# Patient Record
Sex: Male | Born: 1970 | Race: Black or African American | Hispanic: No | Marital: Married | Smoking: Never smoker
Health system: Southern US, Community
[De-identification: ages and names within clinical notes are randomized; demographics above are authoritative.]

## PROBLEM LIST (undated history)

## (undated) DIAGNOSIS — K219 Gastro-esophageal reflux disease without esophagitis: Secondary | ICD-10-CM

## (undated) DIAGNOSIS — E669 Obesity, unspecified: Secondary | ICD-10-CM

## (undated) DIAGNOSIS — G4730 Sleep apnea, unspecified: Secondary | ICD-10-CM

## (undated) DIAGNOSIS — I10 Essential (primary) hypertension: Secondary | ICD-10-CM

## (undated) DIAGNOSIS — E119 Type 2 diabetes mellitus without complications: Secondary | ICD-10-CM

## (undated) DIAGNOSIS — E785 Hyperlipidemia, unspecified: Secondary | ICD-10-CM

## (undated) HISTORY — DX: Sleep apnea, unspecified: G47.30

## (undated) HISTORY — DX: Hyperlipidemia, unspecified: E78.5

## (undated) HISTORY — DX: Gastro-esophageal reflux disease without esophagitis: K21.9

## (undated) HISTORY — PX: CARPAL TUNNEL RELEASE: SHX101

## (undated) HISTORY — DX: Obesity, unspecified: E66.9

## (undated) HISTORY — PX: HAND SURGERY: SHX662

---
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
## 2001-08-19 ENCOUNTER — Encounter: Payer: Self-pay | Admitting: Orthopedic Surgery

## 2001-08-19 ENCOUNTER — Ambulatory Visit (HOSPITAL_COMMUNITY): Admission: RE | Admit: 2001-08-19 | Discharge: 2001-08-19 | Payer: Self-pay | Admitting: Orthopedic Surgery

## 2002-03-14 ENCOUNTER — Emergency Department (HOSPITAL_COMMUNITY): Admission: EM | Admit: 2002-03-14 | Discharge: 2002-03-14 | Payer: Self-pay | Admitting: Emergency Medicine

## 2004-09-19 ENCOUNTER — Emergency Department (HOSPITAL_COMMUNITY): Admission: EM | Admit: 2004-09-19 | Discharge: 2004-09-19 | Payer: Self-pay | Admitting: Family Medicine

## 2004-11-13 ENCOUNTER — Emergency Department (HOSPITAL_COMMUNITY): Admission: EM | Admit: 2004-11-13 | Discharge: 2004-11-13 | Payer: Self-pay | Admitting: Family Medicine

## 2005-05-16 ENCOUNTER — Encounter: Admission: RE | Admit: 2005-05-16 | Discharge: 2005-05-16 | Payer: Self-pay | Admitting: Gastroenterology

## 2005-06-17 ENCOUNTER — Emergency Department (HOSPITAL_COMMUNITY): Admission: EM | Admit: 2005-06-17 | Discharge: 2005-06-17 | Payer: Self-pay | Admitting: Family Medicine

## 2005-07-15 ENCOUNTER — Ambulatory Visit (HOSPITAL_BASED_OUTPATIENT_CLINIC_OR_DEPARTMENT_OTHER): Admission: RE | Admit: 2005-07-15 | Discharge: 2005-07-15 | Payer: Self-pay | Admitting: Family Medicine

## 2005-07-28 ENCOUNTER — Ambulatory Visit: Payer: Self-pay | Admitting: Internal Medicine

## 2005-10-24 ENCOUNTER — Emergency Department (HOSPITAL_COMMUNITY): Admission: EM | Admit: 2005-10-24 | Discharge: 2005-10-24 | Payer: Self-pay | Admitting: Family Medicine

## 2006-04-29 DIAGNOSIS — I10 Essential (primary) hypertension: Secondary | ICD-10-CM

## 2006-04-29 HISTORY — DX: Essential (primary) hypertension: I10

## 2006-05-01 ENCOUNTER — Encounter (INDEPENDENT_AMBULATORY_CARE_PROVIDER_SITE_OTHER): Payer: Self-pay | Admitting: Interventional Cardiology

## 2006-05-01 ENCOUNTER — Inpatient Hospital Stay (HOSPITAL_COMMUNITY): Admission: EM | Admit: 2006-05-01 | Discharge: 2006-05-02 | Payer: Self-pay | Admitting: Emergency Medicine

## 2006-06-12 ENCOUNTER — Ambulatory Visit (HOSPITAL_COMMUNITY): Admission: RE | Admit: 2006-06-12 | Discharge: 2006-06-12 | Payer: Self-pay | Admitting: Gastroenterology

## 2006-09-01 ENCOUNTER — Ambulatory Visit: Payer: Self-pay | Admitting: Family Medicine

## 2007-04-30 DIAGNOSIS — Z9289 Personal history of other medical treatment: Secondary | ICD-10-CM

## 2007-04-30 HISTORY — DX: Personal history of other medical treatment: Z92.89

## 2007-11-13 ENCOUNTER — Ambulatory Visit (HOSPITAL_COMMUNITY): Admission: RE | Admit: 2007-11-13 | Discharge: 2007-11-13 | Payer: Self-pay | Admitting: Urology

## 2008-04-29 HISTORY — PX: COLONOSCOPY: SHX174

## 2010-04-13 ENCOUNTER — Emergency Department (HOSPITAL_COMMUNITY)
Admission: EM | Admit: 2010-04-13 | Discharge: 2010-04-13 | Payer: Self-pay | Source: Home / Self Care | Admitting: Family Medicine

## 2010-05-19 ENCOUNTER — Encounter: Payer: Self-pay | Admitting: Gastroenterology

## 2010-07-09 LAB — POCT I-STAT, CHEM 8
HCT: 44 % (ref 39.0–52.0)
Hemoglobin: 15 g/dL (ref 13.0–17.0)
Potassium: 4.1 mEq/L (ref 3.5–5.1)
Sodium: 137 mEq/L (ref 135–145)
TCO2: 30 mmol/L (ref 0–100)

## 2010-08-29 ENCOUNTER — Other Ambulatory Visit: Payer: Self-pay | Admitting: Family Medicine

## 2010-08-29 DIAGNOSIS — M545 Low back pain, unspecified: Secondary | ICD-10-CM

## 2010-09-05 ENCOUNTER — Ambulatory Visit
Admission: RE | Admit: 2010-09-05 | Discharge: 2010-09-05 | Disposition: A | Discharge status: Home / Self Care | Payer: No Typology Code available for payment source | Source: Ambulatory Visit | Attending: Family Medicine | Admitting: Family Medicine

## 2010-09-05 DIAGNOSIS — M545 Low back pain, unspecified: Secondary | ICD-10-CM

## 2010-09-05 MED ORDER — GADOBENATE DIMEGLUMINE 529 MG/ML IV SOLN
20.0000 mL | Freq: Once | INTRAVENOUS | Status: AC | PRN
  Administered 2010-09-05: 20 mL via INTRAVENOUS

## 2010-09-11 NOTE — Op Note (Signed)
Christopher Skinner, Christopher Skinner          ACCOUNT NO.:  [id number]   MEDICAL RECORD NO.:  [id number]          PATIENT TYPE:  AMB   LOCATION:  DAY                          FACILITY:  [facility]   PHYSICIAN:  Bertram Millard. Dahlstedt, M.D.DATE OF BIRTH:  05/31/1970   DATE OF PROCEDURE:  11/13/2007  DATE OF DISCHARGE:                               OPERATIVE REPORT   PREOPERATIVE DIAGNOSIS:  Phimosis.   POSTOPERATIVE DIAGNOSIS:  Phimosis.   PRINCIPAL PROCEDURE:  Circumcision.   SURGEON:  Bertram Millard. Dahlstedt, M.D.   ANESTHESIA:  General with LMA.   COMPLICATIONS:  None.   BRIEF HISTORY:  Adult male with painful phimosis, with tearing of his  foreskin during intercourse, precluding normal activities for the  patient.  The patient presented recently with these symptoms.  An  evaluation revealed mild phimosis and balanitis.  He presents at this  time for circumcision as primary therapy.  Risks and complications of  the procedure have been discussed with the patient.  This will be done  on an outpatient basis.   DESCRIPTION OF PROCEDURE:  The patient was identified in the holding  area and received preoperative IV antibiotics.  He was taken to the  operating room where general anesthetic was administered.  Genitalia and  perineum were prepped and draped.  Two circumcising incisions were made,  one on the proximal and one on the distal foreskin.  The foreskin was  excised with electrocautery.  Careful hemostasis was obtained on the  underlying skin.  Quadrant sutures of 4-0 chromic were then placed, with  the ventral most being a U stitch in the frenulum.  In between all  these quadrant sutures the same 4-0 chromic was used to run the skin  closed in a simple running fashion.  The wound was hemostatic at this  point.  The usual dressing of Vaseline gauze, Kling and Coban were  placed.   The patient received a block with quarter percent plain Marcaine prior  to the procedure.  He was then taken to  the PACU in stable condition.  He will follow up in approximately 2 weeks.      Bertram Millard. Dahlstedt, M.D.  Electronically Signed     SMD/MEDQ  D:  11/13/2007  T:  11/13/2007  Job:  [id number]

## 2010-09-14 NOTE — Cardiovascular Report (Signed)
NAMEDARROLL, BREDESON NO.:  [id number]   MEDICAL RECORD NO.:  [id number]          PATIENT TYPE:  INP   LOCATION:  2009                         FACILITY:  [facility]   PHYSICIAN:  Lyn Records, M.D.   DATE OF BIRTH:  17-May-1970   DATE OF PROCEDURE:  DATE OF DISCHARGE:                            CARDIAC CATHETERIZATION   INDICATION:  The patient is 40 years of age, obese, has sleep apnea and  hypertension, not currently treated.  For 1-2 weeks he has been having  exertional dyspnea and intermittent sharp and pressure-like chest  discomfort.  This morning after showering he developed severe substernal  discomfort that was really more of a sharp, tight discomfort with an  inspiratory component that tended to exacerbate the discomfort.  The  patient came to the emergency room after this discomfort had persisted  for 30-40 minutes and 3 sets of cardiac markers (POC) revealed NB  components that were elevated with normal troponins.  Because of his  risk factors and the indeterminate nature of his clinical presentation,  it is felt that coronary angiography as a direct means of coronary  visualization is the most appropriate test.  His obesity would make ECHO  imaging and nuclear imaging difficult.  Since he is still hurting, time  is of the essence.  Pulmonary embolism is felt to be less likely because  his D-dimer is normal.   PROCEDURE PERFORMED:  1. Left heart catheter.  2. Selective coronary angiography.  3. Left ventriculography.   DESCRIPTION:  After informed consent, a 6-French sheath was placed in  the right femoral artery used with a single anterior stick.  The sheath  was then inserted without difficulty and an A2 multipurpose catheter was  advanced retrograde over a wire to the ascending aorta.  Hemodynamic  recordings were performed and left ventriculography by hand injection  was performed.  This catheter was used for left coronary angiography as  well.   It was also used for right coronary angiography.  The patient  tolerated the procedure without complications.  We did not attempt Angio-  Seal.   RESULTS:  1. Hemodynamic data.      a.     Aortic pressure 137/98.      b.     Left ventricular pressure 133/16.  2. Left ventriculography:  Left ventricular cavity size and function      are normal.  EF is normal.  3. Coronary angiography.      a.     Left main coronary:  The left main coronary artery is       normal.      b.     Left anterior descending coronary artery:  The left anterior       descending coronary artery is a large vessel that wraps around the       left ventricular apex and contains no significant stenoses.       Irregularities are noted in the midvessel.  A large first diagonal       arises from the mid-LAD and is normal.  A large second diagonal  also arises from the LAD and is normal.      c.     Circumflex artery:  The circumflex artery gives origin to 2       small obtuse marginals distally.  It is also free of obstruction.      d.     Right coronary:  The right coronary artery is normal.  PDA       and left ventricular branches are normal.   CONCLUSION:  1. Normal coronary arteries.  2. Normal left ventricular function.  3. Differential diagnosis now consists of probable acute pericarditis      versus noncardiac chest pain versus pulmonary embolism.  Embolism      seems doubtful given the absence of shortness of breath, normal O2      saturation, and normal D-dimer.      Lyn Records, M.D.  Electronically Signed     HWS/MEDQ  D:  05/01/2006  T:  05/01/2006  Job:  [id number]   cc:   Molly Maduro A. Nicholos Johns, M.D.

## 2010-09-14 NOTE — Procedures (Signed)
Christopher Skinner, Christopher Skinner          ACCOUNT NO.:  [id number]   MEDICAL RECORD NO.:  [id number]          PATIENT TYPE:  OUT   LOCATION:  SLEEP CENTER                 FACILITY:  [facility]   PHYSICIAN:  Clinton D. Maple Hudson, M.D. DATE OF BIRTH:  1971-04-21   DATE OF STUDY:  07/15/2005                              NOCTURNAL POLYSOMNOGRAM   REFERRING PHYSICIAN:  Dr. Elias Else.   INDICATIONS FOR STUDY:  Hypersomnia with sleep apnea.   EPWORTH SLEEPINESS SCORE:  16/24, BMI 51.3, weight 347 pounds.   HOME MEDICATIONS:  Prednisone, AcipHex.   SLEEP ARCHITECTURE:  Total sleep time 410 minutes with sleep efficiency 93%.  Stage I was 12%, stage II 72%. Stages III and IV absent. REM 17% of total  sleep time.  Sleep latency 7 minutes. REM latency 101 minutes. Awake after  sleep onset 27 minutes. Arousal index 17.6.  No bedtime medication was  reported.   RESPIRATORY DATA:  Split study protocol.  Apnea/hypopnea index (AHI, RDI)  18.1 obstructive events per hour indicating moderate obstructive sleep  apnea/hypopnea syndrome before CPAP.  This included 3 obstructive apneas and  36 hypopneas before CPAP.  Events were not positional.  REM AHI 5.3 per  hour.  CPAP was titrated to 12 CWP, AHI 0 per hour.  A small Respironics  ComfortGel nasal mask was used with a heated humidifier and reported as well  tolerated.   OXYGEN DATA:  Loud snoring with oxygen desaturation to a nadir of 89% before  CPAP control.  After CPAP, saturation held 96% on room air.   CARDIAC DATA:  Normal sinus rhythm.   MOVEMENT/PARASOMNIA:  No significant leg jerk or behavior disturbance.  Bathroom x1.   IMPRESSION/RECOMMENDATIONS:  1.  Moderate obstructive sleep apnea/hypopnea syndrome, AHI 18.1 per hour      with nonpositional events, loud snoring and oxygen desaturation to 89%.  2.  Successful CPAP titration to 12 CWP, AHI 0 per hour.  A small      Respironics ComfortGel nasal mask was used with heated humidifier.      Clinton D. Maple Hudson, M.D.  Diplomate, Biomedical engineer of Sleep Medicine  Electronically Signed     CDY/MEDQ  D:  07/28/2005 09:24:55  T:  07/29/2005 11:26:49  Job:  [id number]

## 2010-09-14 NOTE — Discharge Summary (Signed)
NAMENORM, WRAY NO.:  [id number]   MEDICAL RECORD NO.:  [id number]          PATIENT TYPE:  INP   LOCATION:  2009                         FACILITY:  [facility]   PHYSICIAN:  Lyn Records, M.D.   DATE OF BIRTH:  11/04/1970   DATE OF ADMISSION:  05/01/2006  DATE OF DISCHARGE:  05/02/2006                               DISCHARGE SUMMARY   DISCHARGE DIAGNOSES:  1. Acute pericarditis with resolution of chest pain with Decadron.  2. Hypertension.  3. Obesity.  4. Sleep apnea, using CPAP occasionally.   HOSPITAL COURSE:  Mr. Grine is a 40 year old male patient with no  known history of coronary artery disease who presented to [facility] Emergency  Room after a 2-3 day history of dyspnea.  On the date of admission, he  developed substernal chest pain once stepping out of the shower.   He presented to the emergency room.  An EKG showed normal sinus rhythm,  first degree AV block with ST segment elevation/J-point elevation  essentially diffusely, but more prominent in the interior leads.  He had  T-wave inversion inferiorly.  The patient was examined by Dr. Katrinka Blazing who  felt that the patient would benefit from an urgent cardiac  catheterization.  The cardiac catheterization showed normal coronary  arteries.  LVEF normal.   A 2D echo was then performed that showed LV systolic function at 65%.  There was no evidence of wall motion abnormalities.  No pericardial  effusion.   LABORATORY DATA:  Today show a sed rate of 19, white count of 6.9,  hemoglobin 13.2, hematocrit 39.7, platelets 373.  Sodium 137, potassium  4.2, BUN 9, creatinine 0.93.  Maximum CK 1504 with an MB fraction 12.3.  Total cholesterol 238, triglycerides 76, and HDL of 47, LDL of 176.   The patient was discharged to home in improved condition.  He was  discharged to home with the following instructions:  Increase activity  slowly.  No lifting over 10 pounds for one week.  No driving for two  days.   Clean  cath site gently with soap and water.  Call for questions or concerns.  He has been using Aciphex daily at home and he may resume this  medication.  Is to start lisinopril/HCTZ 20/12.5 mg one tablet daily.  He is to utilize ibuprofen 200 mg two tablets with each meal three times  a day consistently for one week.      Guy Franco, P.A.      Lyn Records, M.D.  Electronically Signed    LB/MEDQ  D:  06/09/2006  T:  06/10/2006  Job:  [id number]   cc:   Dr. Milly Jakob, M.D.

## 2011-01-25 LAB — BASIC METABOLIC PANEL
Calcium: 9.6
GFR calc Af Amer: >60
GFR calc non Af Amer: >60
Potassium: 4
Sodium: 139

## 2011-01-25 LAB — HEMOGLOBIN AND HEMATOCRIT, BLOOD: Hemoglobin: 12.7 — ABNORMAL LOW

## 2011-04-25 ENCOUNTER — Emergency Department (HOSPITAL_COMMUNITY): Payer: Self-pay

## 2011-04-25 ENCOUNTER — Other Ambulatory Visit: Payer: Self-pay

## 2011-04-25 ENCOUNTER — Emergency Department (HOSPITAL_COMMUNITY)
Admission: EM | Admit: 2011-04-25 | Discharge: 2011-04-25 | Disposition: A | Discharge status: Home / Self Care | Payer: Self-pay | Attending: Emergency Medicine | Admitting: Emergency Medicine

## 2011-04-25 DIAGNOSIS — R209 Unspecified disturbances of skin sensation: Secondary | ICD-10-CM | POA: Insufficient documentation

## 2011-04-25 DIAGNOSIS — M25519 Pain in unspecified shoulder: Secondary | ICD-10-CM | POA: Insufficient documentation

## 2011-04-25 DIAGNOSIS — M542 Cervicalgia: Secondary | ICD-10-CM | POA: Insufficient documentation

## 2011-04-25 DIAGNOSIS — M4712 Other spondylosis with myelopathy, cervical region: Secondary | ICD-10-CM | POA: Insufficient documentation

## 2011-04-25 DIAGNOSIS — M4722 Other spondylosis with radiculopathy, cervical region: Secondary | ICD-10-CM

## 2011-04-25 DIAGNOSIS — I10 Essential (primary) hypertension: Secondary | ICD-10-CM | POA: Insufficient documentation

## 2011-04-25 HISTORY — DX: Essential (primary) hypertension: I10

## 2011-04-25 MED ORDER — PREDNISONE 20 MG PO TABS
60.0000 mg | ORAL_TABLET | Freq: Once | ORAL | Status: AC
  Administered 2011-04-25: 60 mg via ORAL
  Filled 2011-04-25: qty 3

## 2011-04-25 MED ORDER — DEXAMETHASONE 6 MG PO TABS: ORAL_TABLET | ORAL | Status: AC

## 2011-04-25 MED ORDER — HYDROCODONE-ACETAMINOPHEN 5-325 MG PO TABS
2.0000 | ORAL_TABLET | Freq: Once | ORAL | Status: AC
  Administered 2011-04-25: 2 via ORAL
  Filled 2011-04-25: qty 2

## 2011-04-25 MED ORDER — HYDROCODONE-ACETAMINOPHEN 5-325 MG PO TABS: ORAL_TABLET | ORAL | Status: DC

## 2011-04-25 MED ORDER — ONDANSETRON HCL 4 MG PO TABS
4.0000 mg | ORAL_TABLET | Freq: Once | ORAL | Status: AC
  Administered 2011-04-25: 4 mg via ORAL
  Filled 2011-04-25: qty 1

## 2011-04-25 NOTE — ED Notes (Signed)
Pt c/o catch in left side of neck and numbness to left arm x 3 weeks.  Reports had similar episode 2 years ago and was told could be a pinched nerve.

## 2011-04-25 NOTE — ED Provider Notes (Signed)
History     CSN: [id number]  Arrival date & time 04/25/11  1634   First MD Initiated Contact with Patient 04/25/11 1708      Chief Complaint  Patient presents with  . Neck Pain    (Consider location/radiation/quality/duration/timing/severity/associated sxs/prior treatment) HPI Comments: Patient reports a sensation similar to a" catch" on the left side of his neck, this is accompanied by a pain and numbing sensation extending into the left shoulder and left arm. This has been going on for approximately 2 years. The pain would come and go. During the last 3 weeks the pain has become progressively worse and in the last few days the pain has involved the left lower leg with numbness and tingling as well. There's been no injury, no fall, no history of surgery. There is a history of the patient being told by a" specialist" that he might have a pinched nerve in his neck. This was never pursued, because it was found during a Worker's Compensation case per the patient. The patient is not dropping objects in the left hand, or falling to the left side. He presents for evaluation of these problems.  Patient is a 40 y.o. male presenting with neck pain. The history is provided by the patient.  Neck Pain  This is a recurrent problem. The current episode started more than 1 week ago. The problem occurs daily. The problem has been gradually worsening. The pain is associated with nothing. Pertinent negatives include no photophobia and no chest pain.    Past Medical History  Diagnosis Date  . Hypertension     History reviewed. No pertinent past surgical history.  No family history on file.  History  Substance Use Topics  . Smoking status: Never Smoker   . Smokeless tobacco: Not on file  . Alcohol Use: No      Review of Systems  Constitutional: Negative for activity change.       All ROS Neg except as noted in HPI  HENT: Positive for neck pain. Negative for nosebleeds.   Eyes: Negative for  photophobia and discharge.  Respiratory: Negative for cough, shortness of breath and wheezing.   Cardiovascular: Negative for chest pain and palpitations.  Gastrointestinal: Negative for abdominal pain and blood in stool.  Genitourinary: Negative for dysuria, frequency and hematuria.  Musculoskeletal: Negative for back pain and arthralgias.  Skin: Negative.   Neurological: Negative for dizziness, seizures and speech difficulty.  Psychiatric/Behavioral: Negative for hallucinations and confusion.    Allergies  Review of patient's allergies indicates no known allergies.  Home Medications  No current outpatient prescriptions on file.  BP 153/101  Pulse 113  Temp(Src) 99 F (37.2 C) (Oral)  Resp 22  Ht 5\' 9"  (1.753 m)  Wt 430 lb (195.047 kg)  BMI 63.50 kg/m2  SpO2 100%  Physical Exam  Nursing note and vitals reviewed. Constitutional: He is oriented to person, place, and time. He appears well-developed and well-nourished.  Non-toxic appearance.  HENT:  Head: Normocephalic.  Right Ear: Tympanic membrane and external ear normal.  Left Ear: Tympanic membrane and external ear normal.  Eyes: EOM and lids are normal. Pupils are equal, round, and reactive to light.  Neck: Carotid bruit is not present.       Decreased range of motion of the neck. Soreness to palpation of the left neck extending into the left shoulder. No palpable nodes appreciated. No hot areas appreciated. No carotid bruits noted.   Cardiovascular: Normal rate, regular rhythm, normal heart sounds,  intact distal pulses and normal pulses.   Pulmonary/Chest: Breath sounds normal. No respiratory distress.  Abdominal: Soft. Bowel sounds are normal. There is no tenderness. There is no guarding.  Musculoskeletal: Normal range of motion.       Radial pulses of the right and left are symmetrical.  Lymphadenopathy:       Head (right side): No submandibular adenopathy present.       Head (left side): No submandibular adenopathy  present.    He has no cervical adenopathy.  Neurological: He is alert and oriented to person, place, and time. He has normal strength. No cranial nerve deficit or sensory deficit.       Grip symmetrical. There is no changes in the thenar eminences.  Skin: Skin is warm and dry.  Psychiatric: He has a normal mood and affect. His speech is normal.    ED Course  Procedures (including critical care time) Pulse oximetry 100% on room air. Within normal limits by my interpretation. Labs Reviewed - No data to display No results found. EKG: Time 5:43 PM. Rate 107. Rhythm sinus tach. First degree block present. Incomplete right bundle branch block present. No STEMI, no apparent life-threatening arrhythmias. Unchanged from 11/12/2007 EKG.  No diagnosis found.    MDM  I have reviewed nursing notes, vital signs, and all appropriate lab and imaging results for this patient.  Prescription for Decadron one daily, and Norco given to the patient.    And  Kathie Dike, [place] 04/27/11 [phone]

## 2011-04-28 NOTE — ED Provider Notes (Signed)
Medical screening examination/treatment/procedure(s) were performed by non-physician practitioner and as supervising physician I was immediately available for consultation/collaboration.   Nakeia Calvi L Finch Costanzo, MD 04/28/11 1757 

## 2011-04-29 ENCOUNTER — Other Ambulatory Visit (HOSPITAL_COMMUNITY): Payer: Self-pay | Admitting: *Deleted

## 2011-04-29 ENCOUNTER — Ambulatory Visit (HOSPITAL_COMMUNITY)
Admission: RE | Admit: 2011-04-29 | Discharge: 2011-04-29 | Disposition: A | Discharge status: Home / Self Care | Payer: Self-pay | Source: Ambulatory Visit | Attending: Family Medicine | Admitting: Family Medicine

## 2011-04-29 ENCOUNTER — Other Ambulatory Visit: Payer: Self-pay

## 2011-04-29 DIAGNOSIS — M25569 Pain in unspecified knee: Secondary | ICD-10-CM

## 2012-02-09 ENCOUNTER — Emergency Department (HOSPITAL_COMMUNITY)
Admission: EM | Admit: 2012-02-09 | Discharge: 2012-02-09 | Disposition: A | Discharge status: Home / Self Care | Payer: No Typology Code available for payment source | Attending: Emergency Medicine | Admitting: Emergency Medicine

## 2012-02-09 ENCOUNTER — Emergency Department (HOSPITAL_COMMUNITY): Payer: No Typology Code available for payment source

## 2012-02-09 ENCOUNTER — Encounter (HOSPITAL_COMMUNITY): Payer: Self-pay | Admitting: Emergency Medicine

## 2012-02-09 DIAGNOSIS — M545 Low back pain, unspecified: Secondary | ICD-10-CM

## 2012-02-09 DIAGNOSIS — M25569 Pain in unspecified knee: Secondary | ICD-10-CM | POA: Insufficient documentation

## 2012-02-09 DIAGNOSIS — M542 Cervicalgia: Secondary | ICD-10-CM

## 2012-02-09 DIAGNOSIS — M25561 Pain in right knee: Secondary | ICD-10-CM

## 2012-02-09 DIAGNOSIS — R079 Chest pain, unspecified: Secondary | ICD-10-CM

## 2012-02-09 DIAGNOSIS — Y92410 Unspecified street and highway as the place of occurrence of the external cause: Secondary | ICD-10-CM | POA: Insufficient documentation

## 2012-02-09 DIAGNOSIS — E669 Obesity, unspecified: Secondary | ICD-10-CM | POA: Insufficient documentation

## 2012-02-09 DIAGNOSIS — I10 Essential (primary) hypertension: Secondary | ICD-10-CM | POA: Insufficient documentation

## 2012-02-09 MED ORDER — CYCLOBENZAPRINE HCL 10 MG PO TABS: 10.0000 mg | ORAL_TABLET | Freq: Two times a day (BID) | ORAL | Status: DC | PRN

## 2012-02-09 MED ORDER — IBUPROFEN 800 MG PO TABS: 800.0000 mg | ORAL_TABLET | Freq: Three times a day (TID) | ORAL | Status: DC
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
## 2012-02-09 NOTE — ED Notes (Signed)
Left in c/o family for transport home; alert, oriented x 4; in no distress.  Instructions/prescriptions reviewed and f/u information provided.  Verbalizes understanding.

## 2012-10-09 ENCOUNTER — Ambulatory Visit: Payer: Self-pay | Admitting: Dietician

## 2012-10-26 ENCOUNTER — Encounter: Payer: BC Managed Care – PPO | Attending: Family Medicine | Admitting: Dietician

## 2012-10-26 ENCOUNTER — Encounter: Payer: Self-pay | Admitting: Dietician

## 2012-10-26 DIAGNOSIS — Z713 Dietary counseling and surveillance: Secondary | ICD-10-CM | POA: Insufficient documentation

## 2012-10-26 DIAGNOSIS — E669 Obesity, unspecified: Secondary | ICD-10-CM | POA: Insufficient documentation

## 2012-10-26 NOTE — Progress Notes (Signed)
Medical Nutrition Therapy:  Appt start time: 1100 end time:  1200.  Assessment:  Primary concerns today: obesity.   MEDICATIONS: see list.   DIETARY INTAKE:  Usual eating pattern includes 2 meals and 2 snacks per day.  Everyday foods include chips, fruit, kool aid, juice.  Avoided foods include none noted.    24-hr recall:  B ( AM): sandwich (bacon, eggs, mayo, cheese on white wheat) or grits (1/2 cup dry in water with butter and generous cheese) with eggs (2) and bacon (3-4) or sausage (3-4); OJ or kool-aid to drink Snk ( AM): none  L ( PM): often skips lunch Snk ( PM): chips, fruit, kool aid D ( PM): lots of fish pan fried, baked chix, fried chix, spaghetti, pizza or chinese if eats out. Sides include rice, green beans, corn broccoli. Pro, starch, veg for dinner. kool aid or a soda Snk ( PM): chips, cookies, more of dinner, kool aid or soda Beverages: regular soda, kool aid, fruit juices, no EtOH regularly (once per month)  Usual physical activity: none. Has made attempts to exercise, but has little consistency- used to walk on treadmill, weight lifting. Pt notes a hand injury that limits grip to a certain extent.  Progress Towards Goal(s):  In progress.   Nutritional Diagnosis:  [place]-3.3 Overweight/obesity As related to high consumption of high kcal beverages, meal skipping, high kcal snack food choices.  As evidenced by diet recall, BMI>40.    Intervention:  Nutrition counseling provided regarding reducing kcal intake while maintaining feelings of fullness to control cravings. Emphasis placed on increased consumption of lean proteins and high fiber foods, reduced consumption of sugary foods and fats, particularly animal fats. The Plate Method was demonstrated as a guide for an appropriate dinner plate distribution. Pt also interested in exercise planning, particularly weight training, so weight training regimen and workout calendar including use of the exercise bike was provided. Pt  seems optimistic about dietary changes and having a well-structured exercise plan to work off.  Goals: Mr. Wojdyla will choose drinks with no calories, such as crystal light, plain water. Mr. Zagami will eat 3 meals a day, each with a high protein, and high fiber food. Mr. Carew will choose for snacks more fruit, nuts, seeds, less chips. Mr. Follett will enjoy dessert, but in smaller portions, such as one cookie each night or two cookies every other night.  Handouts given during visit include:  Workout plan  High Fat, Protein, Fiber, Sugar foods list  The Plate Method  Monitoring/Evaluation:  Dietary intake, exercise, portion control, and body weight in 2 month(s).

## 2012-11-09 ENCOUNTER — Encounter: Payer: Self-pay | Admitting: Family Medicine

## 2012-12-25 ENCOUNTER — Encounter: Payer: BC Managed Care – PPO | Attending: Family Medicine | Admitting: Dietician

## 2012-12-25 DIAGNOSIS — E669 Obesity, unspecified: Secondary | ICD-10-CM | POA: Insufficient documentation

## 2012-12-25 DIAGNOSIS — Z713 Dietary counseling and surveillance: Secondary | ICD-10-CM | POA: Insufficient documentation

## 2014-06-30 ENCOUNTER — Ambulatory Visit: Payer: Self-pay

## 2014-07-07 ENCOUNTER — Ambulatory Visit: Payer: Self-pay

## 2014-07-07 ENCOUNTER — Encounter: Payer: 59 | Attending: Internal Medicine

## 2014-07-07 VITALS — Ht 69.0 in | Wt >= 6400 oz

## 2014-07-07 DIAGNOSIS — E119 Type 2 diabetes mellitus without complications: Secondary | ICD-10-CM | POA: Diagnosis present

## 2014-07-07 DIAGNOSIS — Z713 Dietary counseling and surveillance: Secondary | ICD-10-CM | POA: Diagnosis not present

## 2014-07-12 NOTE — Progress Notes (Signed)
Patient was seen on 06/1014 for the first of a series of three diabetes self-management courses at [facility].  Patient Education Plan per assessed needs and concerns is to attend four course education program for Diabetes Self Management Education.  The following learning objectives were met by the patient during this class:  Describe diabetes  State some common risk factors for diabetes  Defines the role of glucose and insulin  Identifies type of diabetes and pathophysiology  Describe the relationship between diabetes and cardiovascular risk  State the members of the Healthcare Team  States the rationale for glucose monitoring  State when to test glucose  State their individual Target Range  State the importance of logging glucose readings  Describe how to interpret glucose readings  Identifies A1C target  Explain the correlation between A1c and eAG values  State symptoms and treatment of high blood glucose  State symptoms and treatment of low blood glucose  Explain proper technique for glucose testing  Identifies proper sharps disposal  Handouts given during class include:  Living Well with Diabetes book  Carb Counting and Meal Planning book  Meal Plan Card  Carbohydrate guide  Meal planning worksheet  Low Sodium Flavoring Tips  The diabetes portion plate  A1c to eAG Conversion Chart  Diabetes Medications  Diabetes Recommended Care Schedule  Support Group  Diabetes Success Plan  Core Class Satisfaction Survey  Follow-Up Plan:  Attend core 2    

## 2014-07-14 ENCOUNTER — Ambulatory Visit: Payer: Self-pay

## 2014-07-14 DIAGNOSIS — E119 Type 2 diabetes mellitus without complications: Secondary | ICD-10-CM

## 2014-07-14 NOTE — Progress Notes (Signed)

## 2014-07-21 ENCOUNTER — Ambulatory Visit: Payer: Self-pay

## 2014-08-02 ENCOUNTER — Encounter: Payer: 59 | Attending: Internal Medicine

## 2014-08-02 DIAGNOSIS — Z713 Dietary counseling and surveillance: Secondary | ICD-10-CM | POA: Diagnosis not present

## 2014-08-02 DIAGNOSIS — E119 Type 2 diabetes mellitus without complications: Secondary | ICD-10-CM | POA: Diagnosis not present

## 2014-08-08 NOTE — Progress Notes (Signed)
Patient was seen on 08/02/14 for the third of a series of three diabetes self-management courses at the [facility].   Christopher Skinner the amount of activity recommended for healthy living . Describe activities suitable for individual needs . Identify ways to regularly incorporate activity into daily life . Identify barriers to activity and ways to over come these barriers  Identify diabetes medications being personally used and their primary action for lowering glucose and possible side effects . Describe role of stress on blood glucose and develop strategies to address psychosocial issues . Identify diabetes complications and ways to prevent them  Explain how to manage diabetes during illness . Evaluate success in meeting personal goal . Establish 2-3 goals that they will plan to diligently work on until they return for the  7-month follow-up visit  Goals:   I will count my carb choices at most meals and snacks  I will be active 30 minutes or more 3 times a week  I will take my diabetes medications as scheduled  I will eat less unhealthy fats by eating less [place] eating and meals follow a pattern  To help manage stress I will  Spend time with family activities each week  Your patient has identified these potential barriers to change:  Motivation Finances  Your patient has identified their diabetes self-care support plan as  [facility] Support Group Family Support Plan:  Attend Core 4 in 4 months

## 2014-12-21 ENCOUNTER — Ambulatory Visit: Payer: 59

## 2016-12-06 ENCOUNTER — Emergency Department (HOSPITAL_COMMUNITY)
Admission: EM | Admit: 2016-12-06 | Discharge: 2016-12-06 | Disposition: A | Discharge status: Home / Self Care | Payer: Self-pay | Attending: Emergency Medicine | Admitting: Emergency Medicine

## 2016-12-06 ENCOUNTER — Emergency Department (HOSPITAL_COMMUNITY): Payer: Self-pay

## 2016-12-06 DIAGNOSIS — R079 Chest pain, unspecified: Secondary | ICD-10-CM | POA: Insufficient documentation

## 2016-12-06 DIAGNOSIS — R202 Paresthesia of skin: Secondary | ICD-10-CM | POA: Insufficient documentation

## 2016-12-06 DIAGNOSIS — Z79899 Other long term (current) drug therapy: Secondary | ICD-10-CM | POA: Insufficient documentation

## 2016-12-06 DIAGNOSIS — Z7984 Long term (current) use of oral hypoglycemic drugs: Secondary | ICD-10-CM | POA: Insufficient documentation

## 2016-12-06 DIAGNOSIS — I10 Essential (primary) hypertension: Secondary | ICD-10-CM | POA: Insufficient documentation

## 2016-12-06 LAB — BASIC METABOLIC PANEL
ANION GAP: 7 (ref 5–15)
BUN: 12 mg/dL (ref 6–20)
CO2: 29 mmol/L (ref 22–32)
Calcium: 9.3 mg/dL (ref 8.9–10.3)
Chloride: 98 mmol/L — ABNORMAL LOW (ref 101–111)
Creatinine, Ser: 1.05 mg/dL (ref 0.61–1.24)
GLUCOSE: 143 mg/dL — AB (ref 65–99)
POTASSIUM: 4.5 mmol/L (ref 3.5–5.1)
Sodium: 134 mmol/L — ABNORMAL LOW (ref 135–145)

## 2016-12-06 LAB — CBC
HEMATOCRIT: 37.9 % — AB (ref 39.0–52.0)
HEMOGLOBIN: 12.4 g/dL — AB (ref 13.0–17.0)
MCH: 28.9 pg (ref 26.0–34.0)
MCHC: 32.7 g/dL (ref 30.0–36.0)
MCV: 88.3 fL (ref 78.0–100.0)
Platelets: 363 10*3/uL (ref 150–400)
RBC: 4.29 MIL/uL (ref 4.22–5.81)
RDW: 14.8 % (ref 11.5–15.5)
WBC: 5.8 10*3/uL (ref 4.0–10.5)

## 2016-12-06 LAB — TROPONIN I

## 2016-12-06 NOTE — ED Notes (Signed)
Patient informed Probation officer that he has had numbness/weakness to left side x3 days. No deficits noted on assessment.

## 2016-12-06 NOTE — Discharge Instructions (Signed)
Use Tylenol for pain.  Talk to your doctor about starting an exercise program, and dietary recommendations.

## 2016-12-06 NOTE — ED Provider Notes (Signed)
[place] DEPT Provider Note   CSN: [id number] Arrival date & time: 12/06/16  1122     History   Chief Complaint Chief Complaint  Patient presents with  . Chest Pain  . Numbness    HPI Christopher Skinner is a 46 y.o. male.  He complains of chest pain, which started today.  Pain is in the upper chest.  The pain is dull.  There is no associated diaphoresis, nausea, vomiting, weakness or dizziness.  He has also noticed some numb sensations in his left arm and leg which come and go over the last several days.  No trauma to head neck or back.  He states he is taking his usual medications.  No cardiac history.  He came here for evaluation, by private vehicle.  There are no other known modifying factors.  HPI  Past Medical History:  Diagnosis Date  . Hyperlipidemia   . Hypertension   . Obesity   . Sleep apnea     Patient Active Problem List   Diagnosis Date Noted  . Obesity, morbid ([place]) 10/26/2012    Past Surgical History:  Procedure Laterality Date  . HAND SURGERY         Home Medications    Prior to Admission medications   Medication Sig Start Date End Date Taking? Authorizing Provider  losartan-hydrochlorothiazide (HYZAAR) 100-12.5 MG per tablet Take 1 tablet by mouth daily.     Yes [provider]  metFORMIN (GLUMETZA) 500 MG (MOD) 24 hr tablet Take 500 mg by mouth daily with breakfast.   Yes [provider]  pravastatin (PRAVACHOL) 40 MG tablet Take 40 mg by mouth daily.   Yes [provider]  ranitidine (ZANTAC) 300 MG capsule Take 300 mg by mouth every evening.   Yes [provider]    Family History Family History  Problem Relation Age of Onset  . Hyperlipidemia Other   . Hypertension Other   . Sleep apnea Other   . Obesity Other     Social History Social History  Substance Use Topics  . Smoking status: Never Smoker  . Smokeless tobacco: Not on file  . Alcohol use No     Allergies   Patient has no  known allergies.   Review of Systems Review of Systems  All other systems reviewed and are negative.    Physical Exam Updated Vital Signs BP 118/63   Pulse 92   Temp 98.2 F (36.8 C) (Oral)   Resp 18   Ht 5\' 9"  (1.753 m)   Wt (!) 218.6 kg (482 lb)   SpO2 98%   BMI 71.18 kg/m   Physical Exam  Constitutional: He is oriented to person, place, and time. He appears well-developed.  Morbidly obese  HENT:  Head: Normocephalic and atraumatic.  Right Ear: External ear normal.  Left Ear: External ear normal.  Eyes: Pupils are equal, round, and reactive to light. Conjunctivae and EOM are normal. Right eye exhibits no discharge. Left eye exhibits no discharge.  Neck: Normal range of motion and phonation normal. Neck supple.  Cardiovascular: Normal rate, regular rhythm and normal heart sounds.   Pulmonary/Chest: Effort normal and breath sounds normal. No respiratory distress. He exhibits no tenderness and no bony tenderness.  Abdominal: Soft. He exhibits no distension. There is no tenderness.  Musculoskeletal: Normal range of motion. He exhibits no edema or tenderness.  No tenderness to palpation over the posterior cervical spine spines and thoracic spines.  Neurological: He is alert and oriented  to person, place, and time. No cranial nerve deficit. He exhibits normal muscle tone. Coordination normal.  Mild light touch dysesthesia of the entire left arm and left leg.  Skin: Skin is warm, dry and intact.  Psychiatric: He has a normal mood and affect. His behavior is normal. Judgment and thought content normal.  Nursing note and vitals reviewed.    ED Treatments / Results  Labs (all labs ordered are listed, but only abnormal results are displayed) Labs Reviewed  BASIC METABOLIC PANEL - Abnormal; Notable for the following:       Result Value   Sodium 134 (*)    Chloride 98 (*)    Glucose, Bld 143 (*)    All other components within normal limits  CBC - Abnormal; Notable for the  following:    Hemoglobin 12.4 (*)    HCT 37.9 (*)    All other components within normal limits  TROPONIN I    EKG  EKG Interpretation  Date/Time:  Friday December 06 2016 11:35:08 EDT Ventricular Rate:  94 PR Interval:    QRS Duration: 120 QT Interval:  381 QTC Calculation: 477 R Axis:   67 Text Interpretation:  Sinus rhythm Atrial premature complexes Prolonged PR interval Consider left atrial enlargement Nonspecific intraventricular conduction delay Baseline wander in lead(s) II III aVF V6 since last tracing no significant change Confirmed by Daleen Bo [phone]) on 12/06/2016 1:47:50 PM       Radiology Dg Chest 2 View  Result Date: 12/06/2016 CLINICAL DATA:  CHEST PAIN WITH LEFT SIDE/ARM NUMBNESS SINCE THIS AM, HTN, NON SMOKER BEST LATERAL OBTAINABLE, MAXED TUBE OUT TO GET THIS LATERAL EXAM: CHEST  2 VIEW COMPARISON:  02/09/2012 FINDINGS: Lateral view degraded by patient size. There may be minimal motion degradation as well. Numerous leads and wires project over the chest. Midline trachea. Normal heart size and mediastinal contours. No pleural effusion or pneumothorax. Clear lungs. IMPRESSION: No acute cardiopulmonary disease. Electronically Signed   By: Abigail Miyamoto M.D.   On: 12/06/2016 12:14    Procedures Procedures (including critical care time)  Medications Ordered in ED Medications - No data to display   Initial Impression / Assessment and Plan / ED Course  I have reviewed the triage vital signs and the nursing notes.  Pertinent labs & imaging results that were available during my care of the patient were reviewed by me and considered in my medical decision making (see chart for details).      Patient Vitals for the past 24 hrs:  BP Temp Temp src Pulse Resp SpO2 Height Weight  12/06/16 1230 118/63 - - 92 18 98 % - -  12/06/16 1215 - - - 97 18 97 % - -  12/06/16 1200 116/67 - - - 18 - - -  12/06/16 1136 (!) 144/84 98.2 F (36.8 C) Oral 98 20 98 % - -  12/06/16  1135 - - - - - - - (!) 218.6 kg (482 lb)  12/06/16 1123 - - - - - - 5\' 9"  (1.753 m) (!) 189.6 kg (418 lb)    2:15 PM Reevaluation with update and discussion. After initial assessment and treatment, an updated evaluation reveals the patient states that his chest pain is almost gone, and his numbness in the left leg has moved to the left ankle.  He continues to have a vague numb sensation in the left triceps region.  Findings discussed with the patient, and all questions were answered.Richarda Blade  Final Clinical Impressions(s) / ED Diagnoses   Final diagnoses:  Chest pain, unspecified type  Paresthesia    Nonspecific chest pain, low risk for cardiac disease.  Nonspecific numbness left arm and left leg without associated weakness, or concern for CVA.  Doubt spinal myelopathy, radiculopathy or metabolic instability.  Nursing Notes Reviewed/ Care Coordinated Applicable Imaging Reviewed Interpretation of Laboratory Data incorporated into ED treatment  The patient appears reasonably screened and/or stabilized for discharge and I doubt any other medical condition or other [facility] requiring further screening, evaluation, or treatment in the ED at this time prior to discharge.  Plan: Home Medications-APAP for pain, continue usual medications; Home Treatments-start gradual exercise program, and continue to watch dietary intake; return here if the recommended treatment, does not improve the symptoms; Recommended follow up- PCP 1 week for check up   New Prescriptions New Prescriptions   No medications on file     Daleen Bo, MD 12/06/16 1420

## 2016-12-06 NOTE — ED Notes (Signed)
EKG given to Dr. McManus.  

## 2016-12-06 NOTE — ED Notes (Signed)
Spoke to Dr. Eulis Foster regarding patients numbness symptoms, states he spoke with patient and patient may be d/c.

## 2016-12-06 NOTE — ED Triage Notes (Signed)
Pt complains of chest pain since 0700 Took 160 mg asa  Pt is morbidly obese and has great difficulty walking due to weight

## 2017-03-22 ENCOUNTER — Emergency Department (HOSPITAL_COMMUNITY): Payer: Self-pay

## 2017-03-22 ENCOUNTER — Emergency Department (HOSPITAL_COMMUNITY)
Admission: EM | Admit: 2017-03-22 | Discharge: 2017-03-22 | Disposition: A | Discharge status: Home / Self Care | Payer: Self-pay | Attending: Emergency Medicine | Admitting: Emergency Medicine

## 2017-03-22 ENCOUNTER — Other Ambulatory Visit: Payer: Self-pay

## 2017-03-22 ENCOUNTER — Encounter (HOSPITAL_COMMUNITY): Payer: Self-pay | Admitting: Emergency Medicine

## 2017-03-22 DIAGNOSIS — Z79899 Other long term (current) drug therapy: Secondary | ICD-10-CM | POA: Insufficient documentation

## 2017-03-22 DIAGNOSIS — R059 Cough, unspecified: Secondary | ICD-10-CM

## 2017-03-22 DIAGNOSIS — Z7984 Long term (current) use of oral hypoglycemic drugs: Secondary | ICD-10-CM | POA: Insufficient documentation

## 2017-03-22 DIAGNOSIS — I10 Essential (primary) hypertension: Secondary | ICD-10-CM | POA: Insufficient documentation

## 2017-03-22 DIAGNOSIS — R05 Cough: Secondary | ICD-10-CM | POA: Insufficient documentation

## 2017-03-22 MED ORDER — ALBUTEROL SULFATE HFA 108 (90 BASE) MCG/ACT IN AERS: 2.0000 | INHALATION_SPRAY | RESPIRATORY_TRACT | 0 refills | Status: DC | PRN

## 2017-03-22 MED ORDER — BENZONATATE 100 MG PO CAPS: 100.0000 mg | ORAL_CAPSULE | Freq: Three times a day (TID) | ORAL | 0 refills | Status: DC

## 2017-03-22 NOTE — ED Triage Notes (Addendum)
Pt c/o coughing for 1 month, tight, nonproductive-- has taken "eberything they sell " OTC without relief. Sleeps with head elevated.

## 2017-03-22 NOTE — ED Provider Notes (Signed)
[place] EMERGENCY DEPARTMENT Provider Note   CSN: [id number] Arrival date & time: 03/22/17  1331     History   Chief Complaint Chief Complaint  Patient presents with  . Cough  . URI    HPI Christopher Skinner is a 46 y.o. male.  46 year old male presents with a one-month history of non-productive cough with wheezing afterwards.  Patient is morbidly obese.  He states that he has had no fever or chills.  No vomiting appreciated.  Has been tested for sleep apnea in the past.  No orthopnea or dyspnea on exertion.  Has used over-the-counter medications without relief.  Denies any prior history of asthma.  He is also not a tobacco user.      Past Medical History:  Diagnosis Date  . Hyperlipidemia   . Hypertension   . Obesity   . Sleep apnea     Patient Active Problem List   Diagnosis Date Noted  . Obesity, morbid ([place]) 10/26/2012    Past Surgical History:  Procedure Laterality Date  . HAND SURGERY         Home Medications    Prior to Admission medications   Medication Sig Start Date End Date Taking? Authorizing Provider  losartan-hydrochlorothiazide (HYZAAR) 100-12.5 MG per tablet Take 1 tablet by mouth daily.      [provider]  metFORMIN (GLUMETZA) 500 MG (MOD) 24 hr tablet Take 500 mg by mouth daily with breakfast.    [provider]  pravastatin (PRAVACHOL) 40 MG tablet Take 40 mg by mouth daily.    [provider]  ranitidine (ZANTAC) 300 MG capsule Take 300 mg by mouth every evening.    [provider]    Family History Family History  Problem Relation Age of Onset  . Hyperlipidemia Other   . Hypertension Other   . Sleep apnea Other   . Obesity Other     Social History Social History   Tobacco Use  . Smoking status: Never Smoker  . Smokeless tobacco: Never Used  Substance Use Topics  . Alcohol use: No  . Drug use: No     Allergies   Patient has no known allergies.   Review  of Systems Review of Systems  All other systems reviewed and are negative.    Physical Exam Updated Vital Signs BP 135/82   Pulse 100   Temp 98.8 F (37.1 C) (Oral)   Resp (!) 24   Ht 1.753 m (5\' 9" )   Wt (!) 215.5 kg (475 lb)   SpO2 91%   BMI 70.15 kg/m   Physical Exam  Constitutional: He is oriented to person, place, and time. He appears well-developed and well-nourished.  Non-toxic appearance. No distress.  HENT:  Head: Normocephalic and atraumatic.  Eyes: Conjunctivae, EOM and lids are normal. Pupils are equal, round, and reactive to light.  Neck: Normal range of motion. Neck supple. No tracheal deviation present. No thyroid mass present.  Cardiovascular: Normal rate, regular rhythm and normal heart sounds. Exam reveals no gallop.  No murmur heard. Pulmonary/Chest: Effort normal and breath sounds normal. No stridor. No respiratory distress. He has no decreased breath sounds. He has no wheezes. He has no rhonchi. He has no rales.  Abdominal: Soft. Normal appearance and bowel sounds are normal. He exhibits no distension. There is no tenderness. There is no rebound and no CVA tenderness.  Musculoskeletal: Normal range of motion. He exhibits no edema or tenderness.  Neurological: He is alert and  oriented to person, place, and time. He has normal strength. No cranial nerve deficit or sensory deficit. GCS eye subscore is 4. GCS verbal subscore is 5. GCS motor subscore is 6.  Skin: Skin is warm and dry. No abrasion and no rash noted.  Psychiatric: He has a normal mood and affect. His speech is normal and behavior is normal.  Nursing note and vitals reviewed.    ED Treatments / Results  Labs (all labs ordered are listed, but only abnormal results are displayed) Labs Reviewed - No data to display  EKG  EKG Interpretation None       Radiology Dg Chest 2 View  Result Date: 03/22/2017 CLINICAL DATA:  Nonproductive cough for several weeks. EXAM: CHEST  2 VIEW COMPARISON:   12/06/2016 FINDINGS: Image quality is degraded by patient body habitus and slight motion artifact on the lateral radiograph. The cardiomediastinal silhouette is unchanged and within normal limits. No confluent airspace opacity, edema, pleural effusion, pneumothorax is identified. Thoracic spondylosis is noted. IMPRESSION: No active cardiopulmonary disease. Electronically Signed   By: Logan Bores M.D.   On: 03/22/2017 15:01    Procedures Procedures (including critical care time)  Medications Ordered in ED Medications - No data to display   Initial Impression / Assessment and Plan / ED Course  I have reviewed the triage vital signs and the nursing notes.  Pertinent labs & imaging results that were available during my care of the patient were reviewed by me and considered in my medical decision making (see chart for details).     Patient will be treated with Ladona Ridgel as well as given prescription for bronchodilator.  Suspect he is having some bronchospasm.  Chest x-ray shows no overt signs of CHF, infection,.  Will be given referral back to his primary care doctor in has been instructed to follow-up with pulmonary as well.  Final Clinical Impressions(s) / ED Diagnoses   Final diagnoses:  None    ED Discharge Orders    None       Lacretia Leigh, MD 03/22/17 1534

## 2017-03-22 NOTE — ED Notes (Signed)
Pt transported to Xray. 

## 2017-04-29 DIAGNOSIS — E119 Type 2 diabetes mellitus without complications: Secondary | ICD-10-CM

## 2017-04-29 DIAGNOSIS — R809 Proteinuria, unspecified: Secondary | ICD-10-CM

## 2017-04-29 HISTORY — DX: Type 2 diabetes mellitus without complications: E11.9

## 2017-04-29 HISTORY — DX: Proteinuria, unspecified: R80.9

## 2018-10-05 ENCOUNTER — Telehealth: Payer: Self-pay | Admitting: Medical

## 2018-10-05 ENCOUNTER — Other Ambulatory Visit: Payer: Self-pay

## 2018-10-05 ENCOUNTER — Ambulatory Visit (INDEPENDENT_AMBULATORY_CARE_PROVIDER_SITE_OTHER): Payer: PRIVATE HEALTH INSURANCE | Admitting: Medical

## 2018-10-05 ENCOUNTER — Encounter: Payer: Self-pay | Admitting: Medical

## 2018-10-05 VITALS — BP 126/80 | HR 106 | Temp 98.0°F | Resp 18 | Ht 69.0 in | Wt >= 6400 oz

## 2018-10-05 DIAGNOSIS — Z1211 Encounter for screening for malignant neoplasm of colon: Secondary | ICD-10-CM | POA: Insufficient documentation

## 2018-10-05 DIAGNOSIS — I10 Essential (primary) hypertension: Secondary | ICD-10-CM | POA: Diagnosis not present

## 2018-10-05 DIAGNOSIS — E785 Hyperlipidemia, unspecified: Secondary | ICD-10-CM | POA: Diagnosis not present

## 2018-10-05 DIAGNOSIS — Z125 Encounter for screening for malignant neoplasm of prostate: Secondary | ICD-10-CM

## 2018-10-05 DIAGNOSIS — Z136 Encounter for screening for cardiovascular disorders: Secondary | ICD-10-CM | POA: Diagnosis not present

## 2018-10-05 DIAGNOSIS — Z0000 Encounter for general adult medical examination without abnormal findings: Secondary | ICD-10-CM | POA: Diagnosis not present

## 2018-10-05 DIAGNOSIS — Z23 Encounter for immunization: Secondary | ICD-10-CM

## 2018-10-05 DIAGNOSIS — Z7189 Other specified counseling: Secondary | ICD-10-CM

## 2018-10-05 DIAGNOSIS — R809 Proteinuria, unspecified: Secondary | ICD-10-CM | POA: Insufficient documentation

## 2018-10-05 DIAGNOSIS — K219 Gastro-esophageal reflux disease without esophagitis: Secondary | ICD-10-CM

## 2018-10-05 DIAGNOSIS — E1165 Type 2 diabetes mellitus with hyperglycemia: Secondary | ICD-10-CM

## 2018-10-05 DIAGNOSIS — R9431 Abnormal electrocardiogram [ECG] [EKG]: Secondary | ICD-10-CM | POA: Insufficient documentation

## 2018-10-05 DIAGNOSIS — G4733 Obstructive sleep apnea (adult) (pediatric): Secondary | ICD-10-CM | POA: Insufficient documentation

## 2018-10-05 DIAGNOSIS — Z7185 Encounter for immunization safety counseling: Secondary | ICD-10-CM

## 2018-10-05 NOTE — Telephone Encounter (Signed)
Records received from [facility]. Sending back for review.

## 2018-10-05 NOTE — Progress Notes (Addendum)
Subjective:   HPI  Christopher Skinner is a 48 y.o. male who presents for Chief Complaint  Patient presents with  . NP    NP fasting CPE    Patient Care Team: Kristoffer Bala, [name], PA-C as PCP - General (Family Medicine) Sees dentist Sees eye doctor  Concerns: New patient physical today.   Was seeing [facility] prior.    Has hx/o diabetes, hypertension, high cholesterol, compliant with medications  He reports hx/o OSA, but treats with sleeping elevated.  Wife still says he snores, but no reported witnessed apnea.    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  Has a mowing business and catering business.    He has chronic low back pain which limits his exercise  He saw [facility] for bariatci consult and went through several of the steps, but then never heard back regarding surgery.  He notes being pre-diabetic although chart records show diabetes as of 2019.   Reviewed their medical, surgical, family, social, medication, and allergy history and updated chart as appropriate.  Past Medical History:  Diagnosis Date  . Diabetes mellitus without complication ([place]) 0762  . GERD (gastroesophageal reflux disease)   . H/O exercise stress test 2009   reportedly normal per patient  . Hyperlipidemia   . Hypertension 2008  . Microalbuminuria 2019  . Obesity   . Sleep apnea    last used CPAP 2014; quit on his own    Past Surgical History:  Procedure Laterality Date  . CARPAL TUNNEL RELEASE     left  . COLONOSCOPY  2010   normal per patient  . HAND SURGERY     growth resection, right    Social History   Socioeconomic History  . Marital status: Married    Spouse name: Not on file  . Number of children: Not on file  . Years of education: Not on file  . Highest education level: Not on file  Occupational History  . Not on file  Social Needs  . Financial resource strain: Not on file  . Food insecurity:    Worry: Not on file   Inability: Not on file  . Transportation needs:    Medical: Not on file    Non-medical: Not on file  Tobacco Use  . Smoking status: Never Smoker  . Smokeless tobacco: Never Used  Substance and Sexual Activity  . Alcohol use: No  . Drug use: No  . Sexual activity: Not on file  Lifestyle  . Physical activity:    Days per week: Not on file    Minutes per session: Not on file  . Stress: Not on file  Relationships  . Social connections:    Talks on phone: Not on file    Gets together: Not on file    Attends religious service: Not on file    Active member of club or organization: Not on file    Attends meetings of clubs or organizations: Not on file    Relationship status: Not on file  . Intimate partner violence:    Fear of current or ex partner: Not on file    Emotionally abused: Not on file    Physically abused: Not on file    Forced sexual activity: Not on file  Other Topics Concern  . Not on file  Social History Narrative   Married, has 1 daughter, [name], owns catering business, has [employer].   09/2018.      Family  History  Problem Relation Age of Onset  . Hyperlipidemia Other   . Hypertension Other   . Sleep apnea Other   . Obesity Other   . Stroke Mother   . Hypertension Mother   . Hypertension Father   . Hypertension Sister   . Hypertension Brother   . Hypertension Sister   . Hypertension Brother   . Hypertension Sister   . Hypertension Sister   . Hypertension Sister   . Hypertension Sister   . Hypertension Sister   . Hypertension Brother   . Hypertension Brother   . Hypertension Brother   . Hypertension Brother   . Hypertension Brother   . Hypertension Brother   . Hypertension Brother   . Hypertension Brother   . Hypertension Brother   . Hypertension Brother   . Hypertension Brother   . Hypertension Brother   . Hypertension Brother   . Hypertension Brother   . Hypertension Brother   . Hypertension Sister   . Hypertension Sister   . Heart  disease Neg Hx   . Cancer Neg Hx   . Diabetes Neg Hx      Current Outpatient Medications:  .  albuterol (PROVENTIL HFA;VENTOLIN HFA) 108 (90 Base) MCG/ACT inhaler, Inhale 2 puffs into the lungs every 4 (four) hours as needed for wheezing or shortness of breath., Disp: 1 Inhaler, Rfl: 0 .  losartan-hydrochlorothiazide (HYZAAR) 100-12.5 MG per tablet, Take 1 tablet by mouth daily.  , Disp: , Rfl:  .  metFORMIN (GLUMETZA) 500 MG (MOD) 24 hr tablet, Take 500 mg by mouth daily with breakfast., Disp: , Rfl:  .  pravastatin (PRAVACHOL) 40 MG tablet, Take 40 mg by mouth daily., Disp: , Rfl:  .  RABEprazole (ACIPHEX) 20 MG tablet, Take 20 mg by mouth daily., Disp: , Rfl:   No Known Allergies   Review of Systems Constitutional: -fever, -chills, -sweats, -unexpected weight change, -decreased appetite, -fatigue Allergy: -sneezing, -itching, -congestion Dermatology: -changing moles, --rash, -lumps ENT: -runny nose, -ear pain, -sore throat, -hoarseness, -sinus pain, -teeth pain, - ringing in ears, -hearing loss, -nosebleeds Cardiology: -chest pain, -palpitations, -swelling, -difficulty breathing when lying flat, -waking up short of breath Respiratory: -cough, -shortness of breath, -difficulty breathing with exercise or exertion, -wheezing, -coughing up blood Gastroenterology: -abdominal pain, -nausea, -vomiting, -diarrhea, -constipation, -blood in stool, -changes in bowel movement, -difficulty swallowing or eating Hematology: -bleeding, -bruising  Musculoskeletal: -joint aches, -muscle aches, -joint swelling, +back pain, -neck pain, -cramping, -changes in gait Ophthalmology: denies vision changes, eye redness, itching, discharge Urology: -burning with urination, -difficulty urinating, -blood in urine, -urinary frequency, -urgency, -incontinence Neurology: -headache, -weakness, -tingling, -numbness, -memory loss, -falls, -dizziness Psychology: -depressed mood, -agitation, -sleep problems Male GU: no  testicular mass, pain, no lymph nodes swollen, no swelling, no rash.     Objective:  BP 126/80   Pulse (!) 106   Temp 98 F (36.7 C) (Temporal)   Resp 18   Ht 5\' 9"  (1.753 m)   Wt (!) 502 lb 3.2 oz (227.8 kg)   SpO2 98%   BMI 74.16 kg/m   General appearance: alert, no distress, WD/WN, African American male, morbidly obese over 500lb Skin: dry skin, somewhat rough skin in middle of back throughout, skin tags scattered about neck bilaterally HEENT: normocephalic, conjunctiva/corneas normal, sclerae anicteric, PERRLA, EOMi, nares patent, no discharge or erythema, pharynx normal Oral cavity: MMM, tongue normal, teeth normal Neck: supple, no lymphadenopathy, no thyromegaly, no masses, normal ROM, no bruits Chest: non tender, normal shape and  expansion Heart: RRR, normal S1, S2, no murmurs Lungs: CTA bilaterally, no wheezes, rhonchi, or rales Abdomen: +bs, soft, non tender, non distended, no masses, no hepatomegaly, no splenomegaly, no bruits Back: non tender, normal ROM, no scoliosis Musculoskeletal: left volar wrist CTS surgical scar, right dorsal hand surgcal scar from prior mass excision, upper extremities non tender, no obvious deformity, normal ROM throughout, lower extremities non tender, no obvious deformity, normal ROM throughout Extremities: no edema, no cyanosis, no clubbing Pulses: 2+ symmetric, upper and lower extremities, normal cap refill Neurological: alert, oriented x 3, CN2-12 intact, strength normal upper extremities and lower extremities, sensation normal throughout, DTRs 2+ throughout, no cerebellar signs, gait normal Psychiatric: normal affect, behavior normal, pleasant  GU: normal male external genitalia, nontender, no masses, no hernia, no lymphadenopathy Rectal: declined and deferred due to body habitus  Diabetic Foot Exam - Simple   Simple Foot Form Diabetic Foot exam was performed with the following findings:  Yes 10/05/2018 11:13 AM  Visual Inspection No  deformities, no ulcerations, no other skin breakdown bilaterally:  Yes Sensation Testing Intact to touch and monofilament testing bilaterally:  Yes Pulse Check See comments:  Yes Comments 1+ pedal pulses     EKG Indication - diabetes, screen for heart disease Rate 94 bpm PR 110ms QRS 34ms QTc 462ms nsr incomplete RBBB, prolonged QT No change from 2018 EKG    Assessment and Plan :   Encounter Diagnoses  Name Primary?  . Encounter for health maintenance examination in adult Yes  . Essential hypertension, benign   . Hyperlipidemia, unspecified hyperlipidemia type   . Microalbuminuria   . Morbid obesity ([place])   . Gastroesophageal reflux disease, esophagitis presence not specified   . Screening for prostate cancer   . Screen for colon cancer   . Screening for heart disease   . Vaccine counseling   . Type 2 diabetes mellitus with hyperglycemia, unspecified whether long term insulin use ([place])   . OSA (obstructive sleep apnea)   . Abnormal EKG   . Need for Tdap vaccination     Physical exam - discussed and counseled on healthy lifestyle, diet, exercise, preventative care, vaccinations, sick and well care, proper use of emergency dept and after hours care, and addressed their concerns.    Health screening: See your eye doctor yearly for routine vision care. See your dentist yearly for routine dental care including hygiene visits twice yearly.  Cancer screening Colonoscopy:  Plan for repeat age 6yo  Discussed PSA, prostate exam, and prostate cancer screening risks/benefits.     Vaccinations: Advised yearly influenza vaccine, pneumococcal 23, Tdap, and at age 32yo, shingrix.   You are due for the following vaccines: Yearly flu shot in August Tetanus diptheria booster Pneumococcal 23 vaccine And at age 55yo, Shingrix shingles vaccines  Counseled on the Tdap (tetanus, diptheria, and acellular pertussis) vaccine.  Vaccine information sheet given. Tdap vaccine given  after consent obtained.   Separate significant chronic issues discussed: Diabetes- prior 2019 HgbA1C around 8%, f/u pending labs, consider SLG2 or GLP1 for weight loss and diabetes control  HTN - c/t same medication  Hyperlipidemia - c/t same Medicaine, labs today  Obesity - prior eval with bariatric clinic at [facility] 2018.  Consider referral back  Microalbuminuria - on ARB  GERD - takes medication daily  OSA - no CPAP use in years.  Consider repeat study  Abnormal EKG - consider cardiology consult    Quavis was seen today for np.  Diagnoses and  all orders for this visit:  Encounter for health maintenance examination in adult -     Comprehensive metabolic panel -     CBC with Differential/Platelet -     Lipid panel -     Hemoglobin A1c -     EKG 12-Lead -     PSA -     Microalbumin/Creatinine Ratio, Urine  Essential hypertension, benign -     Comprehensive metabolic panel  Hyperlipidemia, unspecified hyperlipidemia type -     Lipid panel  Microalbuminuria -     Microalbumin/Creatinine Ratio, Urine  Morbid obesity ([facility])  Gastroesophageal reflux disease, esophagitis presence not specified  Screening for prostate cancer -     PSA  Screen for colon cancer  Screening for heart disease -     EKG 12-Lead  Vaccine counseling  Type 2 diabetes mellitus with hyperglycemia, unspecified whether long term insulin use ([facility])  OSA (obstructive sleep apnea)  Abnormal EKG  Need for Tdap vaccination  Other orders -     Tdap vaccine greater than or equal to 7yo IM    Follow-up pending labs, yearly for physical

## 2018-10-05 NOTE — Patient Instructions (Signed)
Recommendations:  Routine screenings See your eye doctor yearly for routine vision care.  See your dentist yearly for routine dental care including hygiene visits twice yearly.  You will be due for repeat colonoscopy at age 48 years old  Prostate cancer screening, PSA and screening labs today   Vaccine recommendations You are due for the following vaccines: Yearly flu shot in August Tetanus diptheria booster Pneumococcal 23 vaccine And at age 36yo, Shingrix shingles vaccines   I recommend exercise such as walking and chair exercise daily   We will call with lab results and plan    Notable findings today: Abnormal EKG/heart screen marker  Decreased pulse in the legs which could indicate blood flow problems  Chronic back pain  Obesity      Health Maintenance, Male A healthy lifestyle and preventive care is important for your health and wellness. Ask your health care provider about what schedule of regular examinations is right for you. What should I know about weight and diet? Eat a Healthy Diet  Eat plenty of vegetables, fruits, whole grains, low-fat dairy products, and lean protein.  Do not eat a lot of foods high in solid fats, added sugars, or salt.  Maintain a Healthy Weight Regular exercise can help you achieve or maintain a healthy weight. You should:  Do at least 150 minutes of exercise each week. The exercise should increase your heart rate and make you sweat (moderate-intensity exercise).  Do strength-training exercises at least twice a week. Watch Your Levels of Cholesterol and Blood Lipids  Have your blood tested for lipids and cholesterol every 5 years starting at 49 years of age. If you are at high risk for heart disease, you should start having your blood tested when you are 49 years old. You may need to have your cholesterol levels checked more often if: ? Your lipid or cholesterol levels are high. ? You are older than 49 years of age. ? You  are at high risk for heart disease. What should I know about cancer screening? Many types of cancers can be detected early and may often be prevented. Lung Cancer  You should be screened every year for lung cancer if: ? You are a current smoker who has smoked for at least 30 years. ? You are a former smoker who has quit within the past 15 years.  Talk to your health care provider about your screening options, when you should start screening, and how often you should be screened. Colorectal Cancer  Routine colorectal cancer screening usually begins at 49 years of age and should be repeated every 5-10 years until you are 49 years old. You may need to be screened more often if early forms of precancerous polyps or small growths are found. Your health care provider may recommend screening at an earlier age if you have risk factors for colon cancer.  Your health care provider may recommend using home test kits to check for hidden blood in the stool.  A small camera at the end of a tube can be used to examine your colon (sigmoidoscopy or colonoscopy). This checks for the earliest forms of colorectal cancer. Prostate and Testicular Cancer  Depending on your age and overall health, your health care provider may do certain tests to screen for prostate and testicular cancer.  Talk to your health care provider about any symptoms or concerns you have about testicular or prostate cancer. Skin Cancer  Check your skin from head to toe regularly.  Tell your  health care provider about any new moles or changes in moles, especially if: ? There is a change in a mole's size, shape, or color. ? You have a mole that is larger than a pencil eraser.  Always use sunscreen. Apply sunscreen liberally and repeat throughout the day.  Protect yourself by wearing long sleeves, pants, a wide-brimmed hat, and sunglasses when outside. What should I know about heart disease, diabetes, and high blood pressure?  If you  are 20-83 years of age, have your blood pressure checked every 3-5 years. If you are 64 years of age or older, have your blood pressure checked every year. You should have your blood pressure measured twice-once when you are at a hospital or clinic, and once when you are not at a hospital or clinic. Record the average of the two measurements. To check your blood pressure when you are not at a hospital or clinic, you can use: ? An automated blood pressure machine at a pharmacy. ? A home blood pressure monitor.  Talk to your health care provider about your target blood pressure.  If you are between 6-76 years old, ask your health care provider if you should take aspirin to prevent heart disease.  Have regular diabetes screenings by checking your fasting blood sugar level. ? If you are at a normal weight and have a low risk for diabetes, have this test once every three years after the age of 109. ? If you are overweight and have a high risk for diabetes, consider being tested at a younger age or more often.  A one-time screening for abdominal aortic aneurysm (AAA) by ultrasound is recommended for men aged 72-75 years who are current or former smokers. What should I know about preventing infection? Hepatitis B If you have a higher risk for hepatitis B, you should be screened for this virus. Talk with your health care provider to find out if you are at risk for hepatitis B infection. Hepatitis C Blood testing is recommended for:  Everyone born from [birth year] through 1966.  Anyone with known risk factors for hepatitis C. Sexually Transmitted Diseases (STDs)  You should be screened each year for STDs including gonorrhea and chlamydia if: ? You are sexually active and are younger than 50 years of age. ? You are older than 49 years of age and your health care provider tells you that you are at risk for this type of infection. ? Your sexual activity has changed since you were last screened and you are at  an increased risk for chlamydia or gonorrhea. Ask your health care provider if you are at risk.  Talk with your health care provider about whether you are at high risk of being infected with HIV. Your health care provider may recommend a prescription medicine to help prevent HIV infection. What else can I do?  Schedule regular health, dental, and eye exams.  Stay current with your vaccines (immunizations).  Do not use any tobacco products, such as cigarettes, chewing tobacco, and e-cigarettes. If you need help quitting, ask your health care provider.  Limit alcohol intake to no more than 2 drinks per day. One drink equals 12 ounces of beer, 5 ounces of wine, or 1 ounces of hard liquor.  Do not use street drugs.  Do not share needles.  Ask your health care provider for help if you need support or information about quitting drugs.  Tell your health care provider if you often feel depressed.  Tell your health care  provider if you have ever been abused or do not feel safe at home. This information is not intended to replace advice given to you by your health care provider. Make sure you discuss any questions you have with your health care provider. Document Released: 10/12/2007 Document Revised: 12/13/2015 Document Reviewed: 01/17/2015 Elsevier Interactive Patient Education  2019 [employer].

## 2018-10-05 NOTE — Addendum Note (Signed)
Addended by: Carlena Hurl on: 10/05/2018 11:34 AM   Modules accepted: Orders

## 2018-10-06 ENCOUNTER — Telehealth: Payer: Self-pay | Admitting: Medical

## 2018-10-06 ENCOUNTER — Other Ambulatory Visit: Payer: Self-pay | Admitting: Medical

## 2018-10-06 ENCOUNTER — Other Ambulatory Visit: Payer: Self-pay

## 2018-10-06 DIAGNOSIS — I10 Essential (primary) hypertension: Secondary | ICD-10-CM

## 2018-10-06 DIAGNOSIS — G4733 Obstructive sleep apnea (adult) (pediatric): Secondary | ICD-10-CM

## 2018-10-06 DIAGNOSIS — R9431 Abnormal electrocardiogram [ECG] [EKG]: Secondary | ICD-10-CM

## 2018-10-06 DIAGNOSIS — E1165 Type 2 diabetes mellitus with hyperglycemia: Secondary | ICD-10-CM

## 2018-10-06 DIAGNOSIS — E785 Hyperlipidemia, unspecified: Secondary | ICD-10-CM

## 2018-10-06 DIAGNOSIS — Z136 Encounter for screening for cardiovascular disorders: Secondary | ICD-10-CM

## 2018-10-06 LAB — COMPREHENSIVE METABOLIC PANEL
ALT: 33 IU/L (ref 0–44)
AST: 32 IU/L (ref 0–40)
Albumin/Globulin Ratio: 1.1 — ABNORMAL LOW (ref 1.2–2.2)
Albumin: 4.1 g/dL (ref 4.0–5.0)
Alkaline Phosphatase: 71 IU/L (ref 39–117)
BUN/Creatinine Ratio: 15 (ref 9–20)
BUN: 15 mg/dL (ref 6–24)
Bilirubin Total: <0.2 mg/dL (ref 0.0–1.2)
CO2: 19 mmol/L — ABNORMAL LOW (ref 20–29)
Calcium: 9.5 mg/dL (ref 8.7–10.2)
Chloride: 105 mmol/L (ref 96–106)
Creatinine, Ser: 1.02 mg/dL (ref 0.76–1.27)
GFR calc Af Amer: 99 mL/min/{1.73_m2} (ref >59)
GFR calc non Af Amer: 86 mL/min/{1.73_m2} (ref >59)
Globulin, Total: 3.6 g/dL (ref 1.5–4.5)
Glucose: 157 mg/dL — ABNORMAL HIGH (ref 65–99)
Potassium: 4.9 mmol/L (ref 3.5–5.2)
Sodium: 140 mmol/L (ref 134–144)
Total Protein: 7.7 g/dL (ref 6.0–8.5)

## 2018-10-06 LAB — CBC WITH DIFFERENTIAL/PLATELET
Basophils Absolute: 0 10*3/uL (ref 0.0–0.2)
Basos: 1 %
EOS (ABSOLUTE): 0.1 10*3/uL (ref 0.0–0.4)
Eos: 2 %
Hematocrit: 37.8 % (ref 37.5–51.0)
Hemoglobin: 12.3 g/dL — ABNORMAL LOW (ref 13.0–17.7)
Immature Grans (Abs): 0 10*3/uL (ref 0.0–0.1)
Immature Granulocytes: 0 %
Lymphocytes Absolute: 2 10*3/uL (ref 0.7–3.1)
Lymphs: 30 %
MCH: 28.9 pg (ref 26.6–33.0)
MCHC: 32.5 g/dL (ref 31.5–35.7)
MCV: 89 fL (ref 79–97)
Monocytes Absolute: 0.5 10*3/uL (ref 0.1–0.9)
Monocytes: 8 %
Neutrophils Absolute: 4 10*3/uL (ref 1.4–7.0)
Neutrophils: 59 %
Platelets: 373 10*3/uL (ref 150–450)
RBC: 4.26 x10E6/uL (ref 4.14–5.80)
RDW: 14.6 % (ref 11.6–15.4)
WBC: 6.6 10*3/uL (ref 3.4–10.8)

## 2018-10-06 LAB — HEMOGLOBIN A1C
Est. average glucose Bld gHb Est-mCnc: 194 mg/dL
Hgb A1c MFr Bld: 8.4 % — ABNORMAL HIGH (ref 4.8–5.6)

## 2018-10-06 LAB — LIPID PANEL
Chol/HDL Ratio: 5.1 ratio — ABNORMAL HIGH (ref 0.0–5.0)
Cholesterol, Total: 195 mg/dL (ref 100–199)
HDL: 38 mg/dL — ABNORMAL LOW (ref >39)
LDL Calculated: 138 mg/dL — ABNORMAL HIGH (ref 0–99)
Triglycerides: 97 mg/dL (ref 0–149)
VLDL Cholesterol Cal: 19 mg/dL (ref 5–40)

## 2018-10-06 LAB — PSA: Prostate Specific Ag, Serum: <0.1 ng/mL (ref 0.0–4.0)

## 2018-10-06 MED ORDER — ROSUVASTATIN CALCIUM 20 MG PO TABS: 20.0000 mg | ORAL_TABLET | Freq: Every day | ORAL | 3 refills | Status: DC

## 2018-10-06 MED ORDER — LOSARTAN POTASSIUM-HCTZ 100-12.5 MG PO TABS: 1.0000 | ORAL_TABLET | Freq: Every day | ORAL | 3 refills | Status: DC

## 2018-10-06 MED ORDER — METFORMIN HCL 850 MG PO TABS: 850.0000 mg | ORAL_TABLET | Freq: Two times a day (BID) | ORAL | 2 refills | Status: DC

## 2018-10-06 MED ORDER — DAPAGLIFLOZIN PROPANEDIOL 5 MG PO TABS: 5.0000 mg | ORAL_TABLET | Freq: Every day | ORAL | 2 refills | Status: DC

## 2018-10-06 NOTE — Telephone Encounter (Signed)
Left message for wife to call me back regarding pharmacy verify.

## 2018-10-06 NOTE — Telephone Encounter (Signed)
rx were sent to [facility] in [place] instead of [place].   I resent RX to [facility] in [place].

## 2018-10-06 NOTE — Telephone Encounter (Signed)
Wife called said medications were all sent to wrong pharmacy should have gone to [facility] in [place].  Please switch all meds

## 2018-10-07 ENCOUNTER — Other Ambulatory Visit: Payer: Self-pay

## 2018-10-07 DIAGNOSIS — K219 Gastro-esophageal reflux disease without esophagitis: Secondary | ICD-10-CM

## 2018-10-07 MED ORDER — RABEPRAZOLE SODIUM 20 MG PO TBEC: 20.0000 mg | DELAYED_RELEASE_TABLET | Freq: Every day | ORAL | 1 refills | Status: DC

## 2018-10-10 ENCOUNTER — Telehealth: Payer: Self-pay | Admitting: Medical

## 2018-10-10 NOTE — Telephone Encounter (Signed)
P.A. FARXIGA 

## 2018-10-13 ENCOUNTER — Telehealth: Payer: Self-pay | Admitting: Medical

## 2018-10-13 NOTE — Telephone Encounter (Signed)
P.A. approved called pharmacy went thru for $730 for 90 days.  I activated discount card and went thru for $0 co pay.  Called wife Tyrell and informed  & of instructions

## 2018-10-16 ENCOUNTER — Encounter: Payer: Self-pay | Admitting: Medical

## 2018-11-23 ENCOUNTER — Ambulatory Visit: Payer: Self-pay | Admitting: Medical

## 2018-11-23 ENCOUNTER — Telehealth: Payer: Self-pay | Admitting: Medical

## 2018-11-23 ENCOUNTER — Telehealth: Payer: Self-pay | Admitting: Family Medicine

## 2018-11-23 NOTE — Telephone Encounter (Signed)
Left message on voicemail for patient to call back and schedule another appointment.

## 2018-11-23 NOTE — Telephone Encounter (Signed)
Please call.  Did he no show today's appt?  He is due back for f/u from recent new patient physical with uncontrolled diabetes and med changes that visit.     It may be worth calling him now as we may can still work him in today, or at the very least re-schedule and make sure nothing is wrong.

## 2018-11-23 NOTE — Telephone Encounter (Signed)
Wife called, she didn't know pt had an appt.  She will have him call to reschedule.

## 2018-11-25 ENCOUNTER — Encounter: Payer: Self-pay | Admitting: Medical

## 2018-12-26 ENCOUNTER — Other Ambulatory Visit: Payer: Self-pay | Admitting: Medical

## 2018-12-26 DIAGNOSIS — E1165 Type 2 diabetes mellitus with hyperglycemia: Secondary | ICD-10-CM

## 2019-03-09 ENCOUNTER — Ambulatory Visit: Payer: PRIVATE HEALTH INSURANCE | Admitting: Medical

## 2019-04-05 ENCOUNTER — Other Ambulatory Visit: Payer: Self-pay | Admitting: Medical

## 2019-04-05 DIAGNOSIS — E1165 Type 2 diabetes mellitus with hyperglycemia: Secondary | ICD-10-CM

## 2019-04-16 ENCOUNTER — Ambulatory Visit (INDEPENDENT_AMBULATORY_CARE_PROVIDER_SITE_OTHER): Payer: PRIVATE HEALTH INSURANCE | Admitting: Medical

## 2019-04-16 ENCOUNTER — Encounter: Payer: Self-pay | Admitting: Medical

## 2019-04-16 ENCOUNTER — Other Ambulatory Visit: Payer: Self-pay

## 2019-04-16 VITALS — BP 128/84 | HR 104 | Temp 98.2°F | Ht 69.0 in | Wt >= 6400 oz

## 2019-04-16 DIAGNOSIS — E1165 Type 2 diabetes mellitus with hyperglycemia: Secondary | ICD-10-CM

## 2019-04-16 DIAGNOSIS — M25561 Pain in right knee: Secondary | ICD-10-CM

## 2019-04-16 DIAGNOSIS — M25562 Pain in left knee: Secondary | ICD-10-CM

## 2019-04-16 DIAGNOSIS — I10 Essential (primary) hypertension: Secondary | ICD-10-CM | POA: Diagnosis not present

## 2019-04-16 DIAGNOSIS — E785 Hyperlipidemia, unspecified: Secondary | ICD-10-CM | POA: Diagnosis not present

## 2019-04-16 DIAGNOSIS — G8929 Other chronic pain: Secondary | ICD-10-CM

## 2019-04-16 MED ORDER — TRAMADOL HCL 50 MG PO TABS: 50.0000 mg | ORAL_TABLET | Freq: Two times a day (BID) | ORAL | 0 refills | Status: AC | PRN

## 2019-04-16 NOTE — Patient Instructions (Signed)
Please go to [facility] for your knee xrays.   Their hours are 8am - 4:30 pm Monday - Friday.  Take your insurance card with you.  [facility] [id number]  [address], [place], [place] [address]  [address] [place], [place] [address]

## 2019-04-16 NOTE — Progress Notes (Signed)
Subjective: Chief Complaint  Patient presents with  . Knee Pain    bilatral;right is worse    Here for bilateral knee pain.  Has had knee pains on and off for months or longer, but worse in the last week.  Denies injury or fall.  He does get some swelling of the right knee, the both knees hurt.  He denies specific prior injury either.  No prior surgery no prior x-ray.  No hip pain, no ankle pain.  No history of gout.  Since last visit he has been working to get some weight off.  Diabetes-compliant with medication started last visit.  He does not have a glucometer and is not checking blood sugars.  At times he does notice increased urination and increased thirst.  No blurred vision.  No other aggravating or relieving factors. No other complaint.   Past Medical History:  Diagnosis Date  . Diabetes mellitus without complication ([place]) XX123456  . GERD (gastroesophageal reflux disease)   . H/O exercise stress test 2009   reportedly normal per patient  . Hyperlipidemia   . Hypertension 2008  . Microalbuminuria 2019  . Obesity   . Sleep apnea    last used CPAP 2014; quit on his own   Current Outpatient Medications on File Prior to Visit  Medication Sig Dispense Refill  . albuterol (PROVENTIL HFA;VENTOLIN HFA) 108 (90 Base) MCG/ACT inhaler Inhale 2 puffs into the lungs every 4 (four) hours as needed for wheezing or shortness of breath. 1 Inhaler 0  . FARXIGA 5 MG TABS tablet Take 1 tablet by mouth once daily 30 tablet 0  . losartan-hydrochlorothiazide (HYZAAR) 100-12.5 MG tablet Take 1 tablet by mouth daily. 90 tablet 3  . metFORMIN (GLUCOPHAGE) 850 MG tablet TAKE 1 TABLET BY MOUTH TWICE DAILY WITH MEALS 60 tablet 0  . RABEprazole (ACIPHEX) 20 MG tablet Take 1 tablet (20 mg total) by mouth daily. 90 tablet 1  . rosuvastatin (CRESTOR) 20 MG tablet Take 1 tablet (20 mg total) by mouth at bedtime. 90 tablet 3   No current facility-administered medications on file prior to visit.   ROS as in  subjective   Objective: BP 128/84   Pulse (!) 104   Temp 98.2 F (36.8 C)   Ht 5\' 9"  (1.753 m)   Wt (!) 493 lb 6.4 oz (223.8 kg)   SpO2 98%   BMI 72.86 kg/m    Wt Readings from Last 3 Encounters:  04/16/19 (!) 493 lb 6.4 oz (223.8 kg)  10/05/18 (!) 502 lb 3.2 oz (227.8 kg)  03/22/17 (!) 475 lb (215.5 kg)   Gen:wd, wn, nad Severely obese Right knee with generalized tenderness, slight effusion superiorly, mild pain with range of motion.  He does seem to have pain both with flexion and extension as well as lateral and medial stress.  Left knee exam is similar except without any effusion, generalized tenderness, no obvious laxity either knee Hips nontender normal range of motion, ankles nontender normal range of motion His skin is quite dry Pulses 1+ pedal Strength and sensation seem fine Heart regular rate and rhythm, normal S1-S2 no murmurs Lungs clear    Assessment: Encounter Diagnoses  Name Primary?  . Chronic pain of both knees Yes  . Essential hypertension, benign   . Type 2 diabetes mellitus with hyperglycemia, unspecified whether long term insulin use ([place])   . Hyperlipidemia, unspecified hyperlipidemia type      Plan: Chronic knee pain - likely OA and degenerative changes given  his extreme obesity.  He will go for x-ray.  Can use over-the-counter analgesic for mild pain, Ultram below for worse pain  HTN - c/t same medication  Diabetes - labs today, c/t same medication  Hyperlipidemia - c/t same medication  Severe obesity-BMI greater than 72.  He has managed to lose a little weight since last visit.  He is a relatively new patient.  Continue current medications, consider bariatric consult.    Christopher Skinner was seen today for knee pain.  Diagnoses and all orders for this visit:  Chronic pain of both knees -     DG Knee Complete 4 Views Left; Future -     DG Knee Complete 4 Views Right; Future  Essential hypertension, benign -     Comprehensive metabolic  panel -     Hemoglobin A1c  Type 2 diabetes mellitus with hyperglycemia, unspecified whether long term insulin use (HCC) -     Comprehensive metabolic panel -     Hemoglobin A1c  Hyperlipidemia, unspecified hyperlipidemia type  Severe obesity (BMI >= 40) (HCC)  Other orders -     traMADol (ULTRAM) 50 MG tablet; Take 1 tablet (50 mg total) by mouth 2 (two) times daily as needed for up to 5 days.

## 2019-04-17 LAB — COMPREHENSIVE METABOLIC PANEL
ALT: 29 IU/L (ref 0–44)
AST: 28 IU/L (ref 0–40)
Albumin/Globulin Ratio: 1.1 — ABNORMAL LOW (ref 1.2–2.2)
Albumin: 4 g/dL (ref 4.0–5.0)
Alkaline Phosphatase: 73 IU/L (ref 39–117)
BUN/Creatinine Ratio: 8 — ABNORMAL LOW (ref 9–20)
BUN: 8 mg/dL (ref 6–24)
Bilirubin Total: 0.3 mg/dL (ref 0.0–1.2)
CO2: 24 mmol/L (ref 20–29)
Calcium: 9.4 mg/dL (ref 8.7–10.2)
Chloride: 100 mmol/L (ref 96–106)
Creatinine, Ser: 0.96 mg/dL (ref 0.76–1.27)
GFR calc Af Amer: 107 mL/min/{1.73_m2} (ref >59)
GFR calc non Af Amer: 92 mL/min/{1.73_m2} (ref >59)
Globulin, Total: 3.6 g/dL (ref 1.5–4.5)
Glucose: 127 mg/dL — ABNORMAL HIGH (ref 65–99)
Potassium: 4.1 mmol/L (ref 3.5–5.2)
Sodium: 140 mmol/L (ref 134–144)
Total Protein: 7.6 g/dL (ref 6.0–8.5)

## 2019-04-17 LAB — HEMOGLOBIN A1C
Est. average glucose Bld gHb Est-mCnc: 180 mg/dL
Hgb A1c MFr Bld: 7.9 % — ABNORMAL HIGH (ref 4.8–5.6)

## 2019-04-19 ENCOUNTER — Other Ambulatory Visit: Payer: Self-pay | Admitting: Medical

## 2019-04-19 DIAGNOSIS — E1165 Type 2 diabetes mellitus with hyperglycemia: Secondary | ICD-10-CM

## 2019-04-19 MED ORDER — METFORMIN HCL 850 MG PO TABS: 850.0000 mg | ORAL_TABLET | Freq: Two times a day (BID) | ORAL | 3 refills | Status: DC

## 2019-04-19 MED ORDER — FARXIGA 5 MG PO TABS: 5.0000 mg | ORAL_TABLET | Freq: Every day | ORAL | 3 refills | Status: DC

## 2019-05-02 DIAGNOSIS — U071 COVID-19: Secondary | ICD-10-CM

## 2019-05-02 HISTORY — DX: COVID-19: U07.1

## 2019-05-07 ENCOUNTER — Other Ambulatory Visit: Payer: Self-pay

## 2019-05-07 ENCOUNTER — Encounter: Payer: Self-pay | Admitting: Medical

## 2019-05-07 ENCOUNTER — Ambulatory Visit (INDEPENDENT_AMBULATORY_CARE_PROVIDER_SITE_OTHER): Payer: 59 | Admitting: Medical

## 2019-05-07 VITALS — Temp 99.0°F | Ht 69.0 in | Wt >= 6400 oz

## 2019-05-07 DIAGNOSIS — E1165 Type 2 diabetes mellitus with hyperglycemia: Secondary | ICD-10-CM | POA: Diagnosis not present

## 2019-05-07 DIAGNOSIS — R531 Weakness: Secondary | ICD-10-CM | POA: Diagnosis not present

## 2019-05-07 DIAGNOSIS — I10 Essential (primary) hypertension: Secondary | ICD-10-CM

## 2019-05-07 DIAGNOSIS — R509 Fever, unspecified: Secondary | ICD-10-CM

## 2019-05-07 NOTE — Progress Notes (Signed)
Subjective:     Patient ID: Christopher Skinner, male   DOB: 10/27/1970, 49 y.o.   MRN: [id number]  This visit type was conducted due to national recommendations for restrictions regarding the COVID-19 Pandemic (e.g. social distancing) in an effort to limit this patient's exposure and mitigate transmission in our community.  Due to their co-morbid illnesses, this patient is at least at moderate risk for complications without adequate follow up.  This format is felt to be most appropriate for this patient at this time.    Documentation for virtual audio and video telecommunications through Zoom encounter:  The patient was located at home. The provider was located in the office. The patient did consent to this visit and is aware of possible charges through their insurance for this visit.  The other persons participating in this telemedicine service were none. Time spent on call was 20 minutes and in review of previous records 20 minutes total.  This virtual service is not related to other E/M service within previous 7 days.   HPI Chief Complaint  Patient presents with  . Fever  . Nasal Congestion   Virtual consult for not feeling well.  He notes for almost a week having fever up to 100.6 that finally broke in the last day or so, no appetite, loss of taste but that came back yesterday, some chills, headache, some sinus congestion, vomited a few times, some drainage.  Denies rash, cough, shortness of breath, body aches, sore throat.  Wife has been sick and daughter has been sick as well.  None of them have serious health issues.  They are not as bad as him.  He was last to get sick.  He has been using Mucinex, Sudafed, Advil.  He is not worse but about the same as he has been.  He is hydrating well.  He is resting.  No other aggravating or relieving factors. No other complaint.  Past Medical History:  Diagnosis Date  . Diabetes mellitus without complication ([place]) XX123456  . GERD  (gastroesophageal reflux disease)   . H/O exercise stress test 2009   reportedly normal per patient  . Hyperlipidemia   . Hypertension 2008  . Microalbuminuria 2019  . Obesity   . Sleep apnea    last used CPAP 2014; quit on his own   Current Outpatient Medications on File Prior to Visit  Medication Sig Dispense Refill  . albuterol (PROVENTIL HFA;VENTOLIN HFA) 108 (90 Base) MCG/ACT inhaler Inhale 2 puffs into the lungs every 4 (four) hours as needed for wheezing or shortness of breath. 1 Inhaler 0  . dapagliflozin propanediol (FARXIGA) 5 MG TABS tablet Take 5 mg by mouth daily. 30 tablet 3  . losartan-hydrochlorothiazide (HYZAAR) 100-12.5 MG tablet Take 1 tablet by mouth daily. 90 tablet 3  . metFORMIN (GLUCOPHAGE) 850 MG tablet Take 1 tablet (850 mg total) by mouth 2 (two) times daily with a meal. 60 tablet 3  . RABEprazole (ACIPHEX) 20 MG tablet Take 1 tablet (20 mg total) by mouth daily. 90 tablet 1  . rosuvastatin (CRESTOR) 20 MG tablet Take 1 tablet (20 mg total) by mouth at bedtime. 90 tablet 3   No current facility-administered medications on file prior to visit.     Review of Systems As in subjective    Objective:   Physical Exam Due to coronavirus pandemic stay at home measures, patient visit was virtual and they were not examined in person.   Temp 99 F (37.2 C)   Ht  5\' 9"  (1.753 m)   Wt (!) 493 lb (223.6 kg)   BMI 72.80 kg/m       Assessment:     Encounter Diagnoses  Name Primary?  . Fever and chills Yes  . Weak   . Essential hypertension, benign   . Type 2 diabetes mellitus with hyperglycemia, unspecified whether long term insulin use ([place])        Plan:     We discussed his symptoms and concerns.  We discussed that this could represent flu or Covid but more likely Covid at this point.  We discussed supportive care, rest, hydration.  I advise he stop Advil and decongestants given his hypertension and given the potential for Covid.  We reviewed the  instructions below.  I advised that if the gets any more vomiting or loose stool or dehydration to temporarily stop [place] and cut his blood pressure pill in half for a few days to avoid kidney injury.  Advised that if he gets any worse in the next 24 hours to 48 hours to get seen in person at urgent care or the emergency department as he has significant underlying health issues.  He will go and get a Covid test as we discussed.  I emailed him the instructions and information below.    Patient Instructions  Your symptoms and exam today suggests possible Covid infection.    Symptoms can include fever, tiredness, body aches, cough, sore throat, diarrhea, headache, loss of taste or smell, shortness of breath, rash, and discoloration of fingers or toes.    Risk factors that may put someone at risk for worse outcome with covid infection includes older than 50 years old, underlying health conditions like COPD, heart failure, asthma, diabetes, hypertension, kidney disease, obesity, and history of heart disease or stroke.  Currently your symptoms seem mild.     General recommendations: I recommend you rest, hydrate well with water and clear fluids throughout the day.   Drink enough water and clear fluids so that your urine is clear.   You can use Tylenol for pain or fever every 4-6 hours. You can use over the counter Mucinex DM for cough. You can use over the counter Emetrol for nausea.    Consider using over the counter Emergen-C Immune + over the counter for the next week.  If you need any medications from the pharmacy, have a friend or family member pick them up from the pharmacy for you.  Have them drop off the medications at your home to keep you from having to go into the pharmacy and potentially exposure others.   If this is not possible, see if your pharmacy does home delivery.  Or worse case scenario, go through the drive through at your pharmacy, wear your mask, use hand sanitizer before  touching anything in the drive through transaction, and limit interaction with the store personally, particularly staying > 6 feet apart.  Over the next few days if you are having worse trouble breathing, if you are very weak, have persistent fever 101 or higher consistently despite Tylenol, uncontrollable nausea and vomiting, or feel very dehydrated, then call or go to the emergency department.    If you have other questions or have other symptoms or questions you are concerned about then please make a virtual visit with your primary care provider.  Covid symptoms such as fatigue and cough can linger over 2 weeks, even after the initial fever, aches, chills, and other initial symptoms.   Covid  testing:  [place] testing: Schedule your test at one of the [place] testing sites online or by text:  Online: Go to NicTax.com.pt   or   ToolingNews.es, and schedule appointment or review other testing information on the web page  Text: Text the word "COVID" to [phone]       Self Quarantine: The CDC, Centers for Disease Control has recommended a self quarantine of 10 -14 days from the start of your illness until you are symptom-free including at least 24 hours of no symptoms including no fever, no shortness of breath, and no body aches and chills, by day 10 before returning to work or general contact with the public.  What does self quarantine mean: avoiding contact with people as much as possible.   Particularly in your house, isolate your self from others in a separate room, wear a mask when possible in the room, particularly if coughing a lot.   Have others bring food, water, medications, etc., to your door, but avoid direct contact with your household contacts during this time to avoid spreading the infection to them.   If you have a separate bathroom and living quarters during the next 2 weeks away from others, that would be preferable.    If you can't  completely isolate, then wear a mask, wash hands frequently with soap and water for at least 15 seconds, minimize close contact with others, and have a friend or family member check regularly from a distance to make sure you are not getting seriously worse.     You should not be going out in public, should not be going to stores, to work or other public places until all your symptoms have resolved and at least 10 days + 24 hours of no symptoms at all have transpired.   Ideally you should avoid contact with others for a full 10 days if possible.  One of the goals is to limit spread to high risk people; people that are older and elderly, people with multiple health issues like diabetes, heart disease, lung disease, and anybody that has weakened immune systems such as people with cancer or on immunosuppressive therapy.          Rainen was seen today for fever and nasal congestion.  Diagnoses and all orders for this visit:  Fever and chills -     Novel Coronavirus, NAA ([facility]) -     Temperature monitoring; Future  Weak -     Novel Coronavirus, NAA ([facility]) -     Temperature monitoring; Future  Essential hypertension, benign -     Novel Coronavirus, NAA ([facility])  Type 2 diabetes mellitus with hyperglycemia, unspecified whether long term insulin use (HCC) -     Novel Coronavirus, NAA ([facility])  Other orders -     [place]

## 2019-05-07 NOTE — Patient Instructions (Signed)
Your symptoms and exam today suggests possible Covid infection.    Symptoms can include fever, tiredness, body aches, cough, sore throat, diarrhea, headache, loss of taste or smell, shortness of breath, rash, and discoloration of fingers or toes.    Risk factors that may put someone at risk for worse outcome with covid infection includes older than 50 years old, underlying health conditions like COPD, heart failure, asthma, diabetes, hypertension, kidney disease, obesity, and history of heart disease or stroke.  Currently your symptoms seem mild.     General recommendations: I recommend you rest, hydrate well with water and clear fluids throughout the day.   Drink enough water and clear fluids so that your urine is clear.   You can use Tylenol for pain or fever every 4-6 hours. You can use over the counter Mucinex DM for cough. You can use over the counter Emetrol for nausea.    Consider using over the counter Emergen-C Immune + over the counter for the next week.  If you need any medications from the pharmacy, have a friend or family member pick them up from the pharmacy for you.  Have them drop off the medications at your home to keep you from having to go into the pharmacy and potentially exposure others.   If this is not possible, see if your pharmacy does home delivery.  Or worse case scenario, go through the drive through at your pharmacy, wear your mask, use hand sanitizer before touching anything in the drive through transaction, and limit interaction with the store personally, particularly staying > 6 feet apart.  Over the next few days if you are having worse trouble breathing, if you are very weak, have persistent fever 101 or higher consistently despite Tylenol, uncontrollable nausea and vomiting, or feel very dehydrated, then call or go to the emergency department.    If you have other questions or have other symptoms or questions you are concerned about then please make a virtual  visit with your primary care provider.  Covid symptoms such as fatigue and cough can linger over 2 weeks, even after the initial fever, aches, chills, and other initial symptoms.   Covid testing:  [place] testing: Schedule your test at one of the [place] testing sites online or by text:  Online: Go to NicTax.com.pt   or   ToolingNews.es, and schedule appointment or review other testing information on the web page  Text: Text the word "COVID" to [id number]       Self Quarantine: The CDC, Centers for Disease Control has recommended a self quarantine of 10 -14 days from the start of your illness until you are symptom-free including at least 24 hours of no symptoms including no fever, no shortness of breath, and no body aches and chills, by day 10 before returning to work or general contact with the public.  What does self quarantine mean: avoiding contact with people as much as possible.   Particularly in your house, isolate your self from others in a separate room, wear a mask when possible in the room, particularly if coughing a lot.   Have others bring food, water, medications, etc., to your door, but avoid direct contact with your household contacts during this time to avoid spreading the infection to them.   If you have a separate bathroom and living quarters during the next 2 weeks away from others, that would be preferable.    If you can't completely isolate, then wear a mask, wash hands  frequently with soap and water for at least 15 seconds, minimize close contact with others, and have a friend or family member check regularly from a distance to make sure you are not getting seriously worse.     You should not be going out in public, should not be going to stores, to work or other public places until all your symptoms have resolved and at least 10 days + 24 hours of no symptoms at all have transpired.   Ideally you should avoid contact with others for  a full 10 days if possible.  One of the goals is to limit spread to high risk people; people that are older and elderly, people with multiple health issues like diabetes, heart disease, lung disease, and anybody that has weakened immune systems such as people with cancer or on immunosuppressive therapy.

## 2019-05-26 ENCOUNTER — Other Ambulatory Visit: Payer: Self-pay

## 2019-05-26 ENCOUNTER — Encounter: Payer: Self-pay | Admitting: Medical

## 2019-05-26 ENCOUNTER — Ambulatory Visit (INDEPENDENT_AMBULATORY_CARE_PROVIDER_SITE_OTHER): Payer: 59 | Admitting: Medical

## 2019-05-26 VITALS — Ht 69.0 in | Wt >= 6400 oz

## 2019-05-26 DIAGNOSIS — R05 Cough: Secondary | ICD-10-CM

## 2019-05-26 DIAGNOSIS — J988 Other specified respiratory disorders: Secondary | ICD-10-CM

## 2019-05-26 DIAGNOSIS — R059 Cough, unspecified: Secondary | ICD-10-CM

## 2019-05-26 MED ORDER — HYDROCODONE-HOMATROPINE 5-1.5 MG/5ML PO SYRP: 5.0000 mL | ORAL_SOLUTION | Freq: Three times a day (TID) | ORAL | 0 refills | Status: AC | PRN

## 2019-05-26 MED ORDER — AZITHROMYCIN 250 MG PO TABS: ORAL_TABLET | ORAL | 0 refills | Status: DC

## 2019-05-26 MED ORDER — ALBUTEROL SULFATE HFA 108 (90 BASE) MCG/ACT IN AERS: 2.0000 | INHALATION_SPRAY | Freq: Four times a day (QID) | RESPIRATORY_TRACT | 0 refills | Status: DC | PRN

## 2019-05-26 NOTE — Progress Notes (Signed)
Subjective:     Patient ID: Christopher Skinner, male   DOB: February 21, 1971, 49 y.o.   MRN: [id number]  This visit type was conducted due to national recommendations for restrictions regarding the COVID-19 Pandemic (e.g. social distancing) in an effort to limit this patient's exposure and mitigate transmission in our community.  Due to their co-morbid illnesses, this patient is at least at moderate risk for complications without adequate follow up.  This format is felt to be most appropriate for this patient at this time.    Documentation for virtual audio and video telecommunications through Zoom encounter:  The patient was located at home. The provider was located in the office. The patient did consent to this visit and is aware of possible charges through their insurance for this visit.  The other persons participating in this telemedicine service were none. Time spent on call was 20 minutes and in review of previous records 20 minutes total.  This virtual service is not related to other E/M service within previous 7 days.   HPI Chief Complaint  Patient presents with  . Cough    did not get tested but wife and daughter was positive on 05/02/19 and he had all symptoms    Virtual consult for ongoing cough and phlegm.  We did a virtual consult May 07, 2019 for respiratory tract infection probable Covid infection.  Having some headaches.  He never went and got Covid test though.  His wife and daughter had Covid and had symptoms in the same household at the same time.  Overall improving though.     Denies current SOB, no wheezing, no NVD, no chills, no body aches, no fever.  Still using some tylenol and mucinx .  No calve pain.    Last visit on 05/07/2019, he was having a week of fever up to 100.6, no appetite, loss of taste, chills, headache, some sinus congestion, vomited a few times, some drainage.  At that time denied rash, cough, shortness of breath, body aches, sore throat.  Wife had been  sick and daughter had been sick as well.  None of them have serious health issues.  They are not as bad as him.  He was last to get sick.    No other aggravating or relieving factors. No other complaint.  Past Medical History:  Diagnosis Date  . Diabetes mellitus without complication ([place]) XX123456  . GERD (gastroesophageal reflux disease)   . H/O exercise stress test 2009   reportedly normal per patient  . Hyperlipidemia   . Hypertension 2008  . Microalbuminuria 2019  . Obesity   . Sleep apnea    last used CPAP 2014; quit on his own   Current Outpatient Medications on File Prior to Visit  Medication Sig Dispense Refill  . albuterol (PROVENTIL HFA;VENTOLIN HFA) 108 (90 Base) MCG/ACT inhaler Inhale 2 puffs into the lungs every 4 (four) hours as needed for wheezing or shortness of breath. 1 Inhaler 0  . dapagliflozin propanediol (FARXIGA) 5 MG TABS tablet Take 5 mg by mouth daily. 30 tablet 3  . losartan-hydrochlorothiazide (HYZAAR) 100-12.5 MG tablet Take 1 tablet by mouth daily. 90 tablet 3  . metFORMIN (GLUCOPHAGE) 850 MG tablet Take 1 tablet (850 mg total) by mouth 2 (two) times daily with a meal. 60 tablet 3  . RABEprazole (ACIPHEX) 20 MG tablet Take 1 tablet (20 mg total) by mouth daily. 90 tablet 1  . rosuvastatin (CRESTOR) 20 MG tablet Take 1 tablet (20 mg total) by  mouth at bedtime. 90 tablet 3   No current facility-administered medications on file prior to visit.     Review of Systems As in subjective    Objective:   Physical Exam Due to coronavirus pandemic stay at home measures, patient visit was virtual and they were not examined in person.   Ht 5\' 9"  (1.753 m)   Wt (!) 493 lb (223.6 kg)   BMI 72.80 kg/m       Assessment:     Encounter Diagnoses  Name Primary?  . Cough Yes  . Respiratory tract infection        Plan:     Not improved from respiratory tract infection, most likely Covid that we discussed earlier this month.  the other members of his family  all had Covid.  So although he did not get tested he very likely has had Covid.  Although he is improving, he still has considerable phlegm and cough.  Begin medications as below.  Go for chest x-ray.  We discussed checking pulse and if pulse rate higher than 100 he will call back and let me know.   There are no Patient Instructions on file for this visit.  Christopher Skinner was seen today for cough.  Diagnoses and all orders for this visit:  Cough -     DG Chest 2 View; Future  Respiratory tract infection -     DG Chest 2 View; Future  Other orders -     azithromycin (ZITHROMAX) 250 MG tablet; 2 tablets day 1, then 1 tablet days 2-4 -     HYDROcodone-homatropine (HYCODAN) 5-1.5 MG/5ML syrup; Take 5 mLs by mouth every 8 (eight) hours as needed for up to 5 days. -     albuterol (VENTOLIN HFA) 108 (90 Base) MCG/ACT inhaler; Inhale 2 puffs into the lungs every 6 (six) hours as needed for wheezing or shortness of breath.

## 2019-06-08 ENCOUNTER — Telehealth: Payer: Self-pay | Admitting: Medical

## 2019-06-08 ENCOUNTER — Encounter: Payer: Self-pay | Admitting: Medical

## 2019-06-08 ENCOUNTER — Other Ambulatory Visit: Payer: Self-pay

## 2019-06-08 ENCOUNTER — Ambulatory Visit (INDEPENDENT_AMBULATORY_CARE_PROVIDER_SITE_OTHER): Payer: 59 | Admitting: Medical

## 2019-06-08 VITALS — Ht 69.0 in | Wt >= 6400 oz

## 2019-06-08 DIAGNOSIS — R9431 Abnormal electrocardiogram [ECG] [EKG]: Secondary | ICD-10-CM

## 2019-06-08 DIAGNOSIS — G4733 Obstructive sleep apnea (adult) (pediatric): Secondary | ICD-10-CM

## 2019-06-08 DIAGNOSIS — I10 Essential (primary) hypertension: Secondary | ICD-10-CM

## 2019-06-08 DIAGNOSIS — R0602 Shortness of breath: Secondary | ICD-10-CM | POA: Diagnosis not present

## 2019-06-08 DIAGNOSIS — E1165 Type 2 diabetes mellitus with hyperglycemia: Secondary | ICD-10-CM

## 2019-06-08 DIAGNOSIS — R5383 Other fatigue: Secondary | ICD-10-CM

## 2019-06-08 DIAGNOSIS — Z136 Encounter for screening for cardiovascular disorders: Secondary | ICD-10-CM

## 2019-06-08 DIAGNOSIS — R05 Cough: Secondary | ICD-10-CM | POA: Diagnosis not present

## 2019-06-08 DIAGNOSIS — R059 Cough, unspecified: Secondary | ICD-10-CM | POA: Insufficient documentation

## 2019-06-08 MED ORDER — CARVEDILOL 3.125 MG PO TABS: 3.1250 mg | ORAL_TABLET | Freq: Two times a day (BID) | ORAL | 2 refills | Status: DC

## 2019-06-08 MED ORDER — PREDNISONE 20 MG PO TABS: 40.0000 mg | ORAL_TABLET | Freq: Every day | ORAL | 0 refills | Status: DC

## 2019-06-08 MED ORDER — HYDROCODONE-HOMATROPINE 5-1.5 MG/5ML PO SYRP: 5.0000 mL | ORAL_SOLUTION | Freq: Three times a day (TID) | ORAL | 0 refills | Status: AC | PRN

## 2019-06-08 MED ORDER — BLOOD GLUCOSE MONITOR KIT: PACK | 0 refills | Status: AC

## 2019-06-08 NOTE — Progress Notes (Signed)
na

## 2019-06-08 NOTE — Telephone Encounter (Signed)
This has been sent to the pharmacy

## 2019-06-08 NOTE — Telephone Encounter (Signed)
Patient has been informed. New cardiology referral has been placed.

## 2019-06-08 NOTE — Progress Notes (Signed)
Subjective:     Patient ID: Christopher Skinner, male   DOB: 04-Oct-1970, 49 y.o.   MRN: [id number]  This visit type was conducted due to national recommendations for restrictions regarding the COVID-19 Pandemic (e.g. social distancing) in an effort to limit this patient's exposure and mitigate transmission in our community.  Due to their co-morbid illnesses, this patient is at least at moderate risk for complications without adequate follow up.  This format is felt to be most appropriate for this patient at this time.    Documentation for virtual audio and video telecommunications through Zoom encounter:  The patient was located at home. The provider was located in the office. The patient did consent to this visit and is aware of possible charges through their insurance for this visit.  The other persons participating in this telemedicine service were none. Time spent on call was 20 minutes and in review of previous records 20 minutes total.  This virtual service is not related to other E/M service within previous 7 days.   HPI Chief Complaint  Patient presents with  . Cough   Virtual consult for ongoing symptoms.  We have done 2 prior consults regarding calls, illness, likely Covid although he never went for Covid testing.  He likely had his initial symptoms for suspected Covid illness January 1.  We did consult on January 8 in January 27 for the same.  He never went for chest x-ray from last visit.  He currently reports ongoing symptoms, not getting better.  He notes moderate cough, shortness of breath at times, still getting phlegm coming up when he coughs, cough is worse when he is lying flat, tired.  Feels feverish but his temperature is normal.  If he gets the coughing a lot he may vomit.  He denies chest pain, swelling, chills, nausea, diarrhea, headache.  He initially had loss of taste headache fever and chills on January 1 during that first week but those symptoms have  resolved.  Currently he is using Mucinex which is not helping.  He says the Z-Pak and inhalers did not really help either   He does not have a glucometer or scale to check either blood sugars or weight.  He did check his blood pressure today and it was elevated at 195/106 with a pulse rate of 101  Past Medical History:  Diagnosis Date  . Diabetes mellitus without complication ([place]) XX123456  . GERD (gastroesophageal reflux disease)   . H/O exercise stress test 2009   reportedly normal per patient  . Hyperlipidemia   . Hypertension 2008  . Microalbuminuria 2019  . Obesity   . Sleep apnea    last used CPAP 2014; quit on his own   Current Outpatient Medications on File Prior to Visit  Medication Sig Dispense Refill  . albuterol (PROVENTIL HFA;VENTOLIN HFA) 108 (90 Base) MCG/ACT inhaler Inhale 2 puffs into the lungs every 4 (four) hours as needed for wheezing or shortness of breath. 1 Inhaler 0  . albuterol (VENTOLIN HFA) 108 (90 Base) MCG/ACT inhaler Inhale 2 puffs into the lungs every 6 (six) hours as needed for wheezing or shortness of breath. 18 g 0  . dapagliflozin propanediol (FARXIGA) 5 MG TABS tablet Take 5 mg by mouth daily. 30 tablet 3  . losartan-hydrochlorothiazide (HYZAAR) 100-12.5 MG tablet Take 1 tablet by mouth daily. 90 tablet 3  . metFORMIN (GLUCOPHAGE) 850 MG tablet Take 1 tablet (850 mg total) by mouth 2 (two) times daily with a meal. 60  tablet 3  . RABEprazole (ACIPHEX) 20 MG tablet Take 1 tablet (20 mg total) by mouth daily. 90 tablet 1  . rosuvastatin (CRESTOR) 20 MG tablet Take 1 tablet (20 mg total) by mouth at bedtime. 90 tablet 3  . azithromycin (ZITHROMAX) 250 MG tablet 2 tablets day 1, then 1 tablet days 2-4 (Patient not taking: Reported on 06/08/2019) 6 tablet 0   No current facility-administered medications on file prior to visit.     Review of Systems As in subjective    Objective:   Physical Exam Due to coronavirus pandemic stay at home measures, patient  visit was virtual and they were not examined in person.   Ht 5\' 9"  (1.753 m)   Wt (!) 493 lb (223.6 kg)   BMI 72.80 kg/m   Wt Readings from Last 3 Encounters:  06/08/19 (!) 493 lb (223.6 kg)  05/26/19 (!) 493 lb (223.6 kg)  05/07/19 (!) 493 lb (223.6 kg)   BP Readings from Last 3 Encounters:  04/16/19 128/84  10/05/18 126/80  03/22/17 (!) 157/81        Assessment:     Encounter Diagnoses  Name Primary?  . SOB (shortness of breath) Yes  . Cough   . Fatigue, unspecified type   . Essential hypertension, benign   . OSA (obstructive sleep apnea)   . Type 2 diabetes mellitus with hyperglycemia, unspecified whether long term insulin use ([place])   . Abnormal EKG        Plan:     We discussed his symptoms and concerns.  He did not go for chest x-ray after his last visit as advised.  We had also previously discussed getting checked out acutely if he was worse.  He has not seen any other provider since her last virtual consult despite ongoing symptoms.  I asked him to come in today in person but he does not have a way to get here today.  So I gave him several options but he needs to be seen in person.  We discussed in person visit at respiratory clinic or urgent care or emergency department this evening or tomorrow if he cannot make it here.  Currently he wants to wait and see me in 2 days when his wife comes in for her appointment.  I advised he be checked out sooner elsewhere if worse.  In the meantime he will begin the medications below.  We will call out glucometer and testing supplies to start checking blood sugars.  He says the pharmacy never mention the glucometer when he went to pick up his last medications.    I asked him to have labs and chest xray today if possible.     Since he lives in [facility], I personally called the lab and radiology to find out about their hours for outpatient testing.  He can go for outpatient labs between 8am and 4 PM, and he can go  to radiology between 7 AM and 6 PM.     [name] was seen today for cough.  Diagnoses and all orders for this visit:  SOB (shortness of breath) -     Basic metabolic panel; Future -     CBC with Differential/Platelet; Future -     Brain natriuretic peptide; Future -     D-dimer, quantitative (not at [facility]); Future -     DG Chest 2 View; Future  Cough -     Basic metabolic panel; Future -     CBC  with Differential/Platelet; Future -     Brain natriuretic peptide; Future -     D-dimer, quantitative (not at [facility]); Future -     DG Chest 2 View; Future  Fatigue, unspecified type -     Basic metabolic panel; Future -     CBC with Differential/Platelet; Future -     Brain natriuretic peptide; Future -     D-dimer, quantitative (not at [facility]); Future -     DG Chest 2 View; Future  Essential hypertension, benign -     Basic metabolic panel; Future -     CBC with Differential/Platelet; Future -     Brain natriuretic peptide; Future -     D-dimer, quantitative (not at [facility]); Future -     DG Chest 2 View; Future  OSA (obstructive sleep apnea)  Type 2 diabetes mellitus with hyperglycemia, unspecified whether long term insulin use ([facility])  Abnormal EKG  Other orders -     HYDROcodone-homatropine (HYCODAN) 5-1.5 MG/5ML syrup; Take 5 mLs by mouth every 8 (eight) hours as needed for up to 5 days. -     predniSONE (DELTASONE) 20 MG tablet; Take 2 tablets (40 mg total) by mouth daily with breakfast. -     carvedilol (COREG) 3.125 MG tablet; Take 1 tablet (3.125 mg total) by mouth 2 (two) times daily with a meal.

## 2019-06-08 NOTE — Telephone Encounter (Signed)
See other message as well  Please call him back.  I sent a short-term steroid called prednisone to the pharmacy to take 2 tablets daily for the next 4 days to calm down cough and inflammation.  I sent a cough syrup he can use every 4-6 hours as needed for cough.  Caution it can make you sleepy.  I also sent an additional blood pressure pill to begin called carvedilol twice daily, low-dose.  This is to add to his current blood pressure pill since his number was so high today.  I do want him to get labs and x-ray.  I called [facility].  He can go into their lab between 8:00 and 4 PM for labs.  He can get a chest x-ray and any pending radiology.  My understanding is that he has to stop by "short stay" at [facility] for screening first.  He needs to make sure he tells them that he first had symptoms January 1 so he is past the quarantine period.  So they should not deny him the opportunity to get an x-ray  If for what ever reason he does not get the labs and x-ray at [facility], he can go to [place] here on [address] for x-ray between 8 and 4, and can come by our office for labs.  Please find a spot to work him on the schedule here for Thursday so I can examine him and get vitals on him.  His wife apparently is coming for an appointment Thursday   Finally, please check the status on cardiology appointment.  I do not see where he has seen them yet although we referred a few visits ago when he was here in person.

## 2019-06-08 NOTE — Telephone Encounter (Signed)
Please call in glucometer #1, strips #100, lancets #100, checks once daily, 11 refills;  Dx: E11.69  Call in to [facility]

## 2019-06-09 ENCOUNTER — Ambulatory Visit
Admission: RE | Admit: 2019-06-09 | Discharge: 2019-06-09 | Disposition: A | Discharge status: Home / Self Care | Payer: 59 | Source: Ambulatory Visit | Attending: Medical | Admitting: Medical

## 2019-06-09 DIAGNOSIS — R059 Cough, unspecified: Secondary | ICD-10-CM

## 2019-06-09 DIAGNOSIS — I10 Essential (primary) hypertension: Secondary | ICD-10-CM

## 2019-06-09 DIAGNOSIS — R05 Cough: Secondary | ICD-10-CM

## 2019-06-09 DIAGNOSIS — R0602 Shortness of breath: Secondary | ICD-10-CM

## 2019-06-09 DIAGNOSIS — R5383 Other fatigue: Secondary | ICD-10-CM

## 2019-06-10 ENCOUNTER — Other Ambulatory Visit: Payer: Self-pay

## 2019-06-10 ENCOUNTER — Encounter: Payer: Self-pay | Admitting: Medical

## 2019-06-10 ENCOUNTER — Ambulatory Visit (INDEPENDENT_AMBULATORY_CARE_PROVIDER_SITE_OTHER): Payer: 59 | Admitting: Medical

## 2019-06-10 VITALS — BP 140/70 | HR 102 | Temp 98.2°F | Ht 69.0 in | Wt >= 6400 oz

## 2019-06-10 DIAGNOSIS — R0602 Shortness of breath: Secondary | ICD-10-CM

## 2019-06-10 DIAGNOSIS — R059 Cough, unspecified: Secondary | ICD-10-CM

## 2019-06-10 DIAGNOSIS — Z136 Encounter for screening for cardiovascular disorders: Secondary | ICD-10-CM

## 2019-06-10 DIAGNOSIS — R05 Cough: Secondary | ICD-10-CM | POA: Diagnosis not present

## 2019-06-10 DIAGNOSIS — R9431 Abnormal electrocardiogram [ECG] [EKG]: Secondary | ICD-10-CM

## 2019-06-10 DIAGNOSIS — R5383 Other fatigue: Secondary | ICD-10-CM | POA: Diagnosis not present

## 2019-06-10 DIAGNOSIS — G4733 Obstructive sleep apnea (adult) (pediatric): Secondary | ICD-10-CM

## 2019-06-10 DIAGNOSIS — I10 Essential (primary) hypertension: Secondary | ICD-10-CM

## 2019-06-10 DIAGNOSIS — J989 Respiratory disorder, unspecified: Secondary | ICD-10-CM

## 2019-06-10 MED ORDER — LANCETS MISC: 12 refills | Status: DC

## 2019-06-10 MED ORDER — GLUCOSE BLOOD VI STRP: ORAL_STRIP | 12 refills | Status: DC

## 2019-06-10 MED ORDER — TRUE METRIX METER DEVI: 1.0000 | Freq: Every day | 0 refills | Status: AC

## 2019-06-10 NOTE — Patient Instructions (Signed)
We will call with lab results  We will call pharmacy and check on glucometer and supplies again  Finish out the prednisone, use the Hycodan cough syrup as needed  Drink plenty of water throughout the day  Glad to hear you have some improvements    Diabetes   Check glucose fasting daily in the morning.  The goal is glucose <130 fasting in the morning   Continue your current medications   Follow up with cardiology on Monday

## 2019-06-10 NOTE — Progress Notes (Signed)
Subjective Chief Complaint  Patient presents with  . Follow-up    need bw stack-had x-ray    Today is an in person follow-up from his virtual consult 2 days ago.  He went for chest x-ray yesterday.  Here today for labs, vital signs and exam. He notes improvement on Hycodan syrup and prednisone added 2 days ago.  We have done 2 prior consults regarding calls, illness, likely Covid although he never went for Covid testing.  He likely had his initial symptoms for suspected Covid illness January 1.  We did consult on January 8 in January 27 for the same.  He never went for chest x-ray from last visit.  He currently reports ongoing symptoms, not getting better.  He notes moderate cough, shortness of breath at times, still getting phlegm coming up when he coughs, cough is worse when he is lying flat, tired.  Feels feverish but his temperature is normal.  If he gets the coughing a lot he may vomit.  He denies chest pain, swelling, chills, nausea, diarrhea, headache.  He initially had loss of taste headache fever and chills on January 1 during that first week but those symptoms have resolved.   Past Medical History:  Diagnosis Date  . Diabetes mellitus without complication ([place]) 0488  . GERD (gastroesophageal reflux disease)   . H/O exercise stress test 2009   reportedly normal per patient  . Hyperlipidemia   . Hypertension 2008  . Microalbuminuria 2019  . Obesity   . Sleep apnea    last used CPAP 2014; quit on his own   Current Outpatient Medications on File Prior to Visit  Medication Sig Dispense Refill  . albuterol (PROVENTIL HFA;VENTOLIN HFA) 108 (90 Base) MCG/ACT inhaler Inhale 2 puffs into the lungs every 4 (four) hours as needed for wheezing or shortness of breath. 1 Inhaler 0  . albuterol (VENTOLIN HFA) 108 (90 Base) MCG/ACT inhaler Inhale 2 puffs into the lungs every 6 (six) hours as needed for wheezing or shortness of breath. 18 g 0  . blood glucose meter kit and supplies KIT  Dispense based on patient and insurance preference. Use 1-2 times daily as directed. (FOR ICD-10 DE11.69). 1 each 0  . carvedilol (COREG) 3.125 MG tablet Take 1 tablet (3.125 mg total) by mouth 2 (two) times daily with a meal. 60 tablet 2  . dapagliflozin propanediol (FARXIGA) 5 MG TABS tablet Take 5 mg by mouth daily. 30 tablet 3  . HYDROcodone-homatropine (HYCODAN) 5-1.5 MG/5ML syrup Take 5 mLs by mouth every 8 (eight) hours as needed for up to 5 days. 120 mL 0  . losartan-hydrochlorothiazide (HYZAAR) 100-12.5 MG tablet Take 1 tablet by mouth daily. 90 tablet 3  . metFORMIN (GLUCOPHAGE) 850 MG tablet Take 1 tablet (850 mg total) by mouth 2 (two) times daily with a meal. 60 tablet 3  . predniSONE (DELTASONE) 20 MG tablet Take 2 tablets (40 mg total) by mouth daily with breakfast. 8 tablet 0  . RABEprazole (ACIPHEX) 20 MG tablet Take 1 tablet (20 mg total) by mouth daily. 90 tablet 1  . rosuvastatin (CRESTOR) 20 MG tablet Take 1 tablet (20 mg total) by mouth at bedtime. 90 tablet 3  . azithromycin (ZITHROMAX) 250 MG tablet 2 tablets day 1, then 1 tablet days 2-4 (Patient not taking: Reported on 06/08/2019) 6 tablet 0   No current facility-administered medications on file prior to visit.     Review of Systems As in subjective    Objective:  Physical Exam  BP 140/70   Pulse (!) 102   Temp 98.2 F (36.8 C)   Ht '5\' 9"'$  (1.753 m)   Wt (!) 492 lb 9.6 oz (223.4 kg)   SpO2 98%   BMI 72.74 kg/m   Wt Readings from Last 3 Encounters:  06/10/19 (!) 492 lb 9.6 oz (223.4 kg)  06/08/19 (!) 493 lb (223.6 kg)  05/26/19 (!) 493 lb (223.6 kg)   BP Readings from Last 3 Encounters:  06/10/19 140/70  04/16/19 128/84  10/05/18 126/80   General: Morbidly obese African American male, no acute distress Lungs relatively clear no wheezing no rales no rhonchi no obvious dyspnea Heart regular rate and rhythm, normal S1-S2, no murmurs Lower extremities with dry skin, possible 1+ nonpitting bilateral  edema, no calf asymmetry, no calf tenderness, negative Homans Oral, no lesions, moist mucous membranes HEENT unremarkable Neck supple, nontender, no thyromegaly, no nodules, no lymphadenopathy, no JVD Pulses WNL    Assessment:     Encounter Diagnoses  Name Primary?  . SOB (shortness of breath)   . Cough   . Fatigue, unspecified type   . Essential hypertension, benign   . OSA (obstructive sleep apnea) Yes  . Severe obesity (BMI >= 40) (H[facility])   . Screening for heart disease   . Abnormal EKG   . Respiratory illness        Plan:     This is a follow-up from his visit virtually 2 days ago.  His vital signs are relatively stable.  His exam other than morbid obesity is unremarkable.  We referred to cardiology and he has appointment in 4 days with cardiology  He does note improvement with Hycodan cough syrup and prednisone that I added 2 days ago.  Continue to rest, hydrate well and finish out prednisone.  Continue Hycodan as needed.  We reviewed his chest x-ray from yesterday which was normal  Labs as below today.  Still needs to work on losing weight through healthy eating plan getting exercise  There was an issue with the pharmacy with glucometer kit.  We will call the pharmacy to find out why this is not available for him   Christopher Skinner was seen today for follow-up.  Diagnoses and all orders for this visit:  OSA (obstructive sleep apnea)  SOB (shortness of breath) -     D-dimer, quantitative (not at [facility]) -     Brain natriuretic peptide -     CBC with Differential/Platelet -     Basic metabolic panel  Cough -     D-dimer, quantitative (not at [facility]) -     Brain natriuretic peptide -     CBC with Differential/Platelet -     Basic metabolic panel  Fatigue, unspecified type -     D-dimer, quantitative (not at [facility]) -     Brain natriuretic peptide -     CBC with Differential/Platelet -     Basic metabolic panel  Essential hypertension, benign -     D-dimer, quantitative  (not at [facility]) -     Brain natriuretic peptide -     CBC with Differential/Platelet -     Basic metabolic panel  Severe obesity (BMI >= 40) (HCC)  Screening for heart disease  Abnormal EKG  Respiratory illness  Other orders -     Blood Glucose Monitoring Suppl (TRUE METRIX METER) DEVI; 1 Device by Does not apply route daily. -     glucose blood test strip; True metrix strips  to test 1-2 times daily -     Lancets MISC; Use as instructed. Needs lancets based on pt's meter (true metrix)

## 2019-06-11 ENCOUNTER — Other Ambulatory Visit: Payer: Self-pay | Admitting: Medical

## 2019-06-11 DIAGNOSIS — R071 Chest pain on breathing: Secondary | ICD-10-CM

## 2019-06-11 LAB — CBC WITH DIFFERENTIAL/PLATELET
Basophils Absolute: 0 10*3/uL (ref 0.0–0.2)
Basos: 0 %
EOS (ABSOLUTE): 0.1 10*3/uL (ref 0.0–0.4)
Eos: 2 %
Hematocrit: 35.1 % — ABNORMAL LOW (ref 37.5–51.0)
Hemoglobin: 11.5 g/dL — ABNORMAL LOW (ref 13.0–17.7)
Immature Grans (Abs): 0 10*3/uL (ref 0.0–0.1)
Immature Granulocytes: 1 %
Lymphocytes Absolute: 1.9 10*3/uL (ref 0.7–3.1)
Lymphs: 24 %
MCH: 28.5 pg (ref 26.6–33.0)
MCHC: 32.8 g/dL (ref 31.5–35.7)
MCV: 87 fL (ref 79–97)
Monocytes Absolute: 0.5 10*3/uL (ref 0.1–0.9)
Monocytes: 6 %
Neutrophils Absolute: 5.3 10*3/uL (ref 1.4–7.0)
Neutrophils: 67 %
Platelets: 389 10*3/uL (ref 150–450)
RBC: 4.04 x10E6/uL — ABNORMAL LOW (ref 4.14–5.80)
RDW: 15.1 % (ref 11.6–15.4)
WBC: 7.9 10*3/uL (ref 3.4–10.8)

## 2019-06-11 LAB — BASIC METABOLIC PANEL
BUN/Creatinine Ratio: 8 — ABNORMAL LOW (ref 9–20)
BUN: 12 mg/dL (ref 6–24)
CO2: 24 mmol/L (ref 20–29)
Calcium: 9.7 mg/dL (ref 8.7–10.2)
Chloride: 100 mmol/L (ref 96–106)
Creatinine, Ser: 1.5 mg/dL — ABNORMAL HIGH (ref 0.76–1.27)
GFR calc Af Amer: 62 mL/min/{1.73_m2} (ref >59)
GFR calc non Af Amer: 54 mL/min/{1.73_m2} — ABNORMAL LOW (ref >59)
Glucose: 129 mg/dL — ABNORMAL HIGH (ref 65–99)
Potassium: 4.1 mmol/L (ref 3.5–5.2)
Sodium: 139 mmol/L (ref 134–144)

## 2019-06-11 LAB — D-DIMER, QUANTITATIVE: D-DIMER: 1.2 mg/L FEU — ABNORMAL HIGH (ref 0.00–0.49)

## 2019-06-11 LAB — BRAIN NATRIURETIC PEPTIDE: BNP: 7.7 pg/mL (ref 0.0–100.0)

## 2019-06-12 ENCOUNTER — Observation Stay (HOSPITAL_COMMUNITY)
Admission: EM | Admit: 2019-06-12 | Discharge: 2019-06-13 | Disposition: A | Discharge status: Home / Self Care | Payer: 59 | Attending: Internal Medicine | Admitting: Internal Medicine

## 2019-06-12 ENCOUNTER — Encounter (HOSPITAL_COMMUNITY): Payer: Self-pay | Admitting: Emergency Medicine

## 2019-06-12 ENCOUNTER — Emergency Department (HOSPITAL_COMMUNITY): Payer: 59

## 2019-06-12 ENCOUNTER — Other Ambulatory Visit: Payer: Self-pay

## 2019-06-12 DIAGNOSIS — R079 Chest pain, unspecified: Secondary | ICD-10-CM | POA: Diagnosis present

## 2019-06-12 DIAGNOSIS — M25561 Pain in right knee: Secondary | ICD-10-CM | POA: Diagnosis not present

## 2019-06-12 DIAGNOSIS — Z823 Family history of stroke: Secondary | ICD-10-CM | POA: Insufficient documentation

## 2019-06-12 DIAGNOSIS — Z79899 Other long term (current) drug therapy: Secondary | ICD-10-CM | POA: Diagnosis not present

## 2019-06-12 DIAGNOSIS — I700 Atherosclerosis of aorta: Secondary | ICD-10-CM | POA: Diagnosis not present

## 2019-06-12 DIAGNOSIS — K219 Gastro-esophageal reflux disease without esophagitis: Secondary | ICD-10-CM | POA: Insufficient documentation

## 2019-06-12 DIAGNOSIS — R809 Proteinuria, unspecified: Secondary | ICD-10-CM | POA: Insufficient documentation

## 2019-06-12 DIAGNOSIS — E1165 Type 2 diabetes mellitus with hyperglycemia: Secondary | ICD-10-CM

## 2019-06-12 DIAGNOSIS — I4510 Unspecified right bundle-branch block: Secondary | ICD-10-CM | POA: Insufficient documentation

## 2019-06-12 DIAGNOSIS — R0789 Other chest pain: Principal | ICD-10-CM | POA: Insufficient documentation

## 2019-06-12 DIAGNOSIS — M25562 Pain in left knee: Secondary | ICD-10-CM | POA: Insufficient documentation

## 2019-06-12 DIAGNOSIS — E785 Hyperlipidemia, unspecified: Secondary | ICD-10-CM | POA: Diagnosis not present

## 2019-06-12 DIAGNOSIS — M545 Low back pain, unspecified: Secondary | ICD-10-CM

## 2019-06-12 DIAGNOSIS — G4733 Obstructive sleep apnea (adult) (pediatric): Secondary | ICD-10-CM | POA: Diagnosis not present

## 2019-06-12 DIAGNOSIS — Z833 Family history of diabetes mellitus: Secondary | ICD-10-CM | POA: Diagnosis not present

## 2019-06-12 DIAGNOSIS — Z9119 Patient's noncompliance with other medical treatment and regimen: Secondary | ICD-10-CM | POA: Diagnosis not present

## 2019-06-12 DIAGNOSIS — Z7984 Long term (current) use of oral hypoglycemic drugs: Secondary | ICD-10-CM | POA: Diagnosis not present

## 2019-06-12 DIAGNOSIS — Z8249 Family history of ischemic heart disease and other diseases of the circulatory system: Secondary | ICD-10-CM | POA: Insufficient documentation

## 2019-06-12 DIAGNOSIS — Z836 Family history of other diseases of the respiratory system: Secondary | ICD-10-CM | POA: Insufficient documentation

## 2019-06-12 DIAGNOSIS — Z6841 Body Mass Index (BMI) 40.0 and over, adult: Secondary | ICD-10-CM | POA: Diagnosis not present

## 2019-06-12 DIAGNOSIS — M1990 Unspecified osteoarthritis, unspecified site: Secondary | ICD-10-CM | POA: Insufficient documentation

## 2019-06-12 DIAGNOSIS — I10 Essential (primary) hypertension: Secondary | ICD-10-CM | POA: Diagnosis not present

## 2019-06-12 DIAGNOSIS — Z8616 Personal history of COVID-19: Secondary | ICD-10-CM | POA: Insufficient documentation

## 2019-06-12 DIAGNOSIS — G8929 Other chronic pain: Secondary | ICD-10-CM | POA: Diagnosis not present

## 2019-06-12 DIAGNOSIS — M5136 Other intervertebral disc degeneration, lumbar region: Secondary | ICD-10-CM | POA: Insufficient documentation

## 2019-06-12 DIAGNOSIS — Z7952 Long term (current) use of systemic steroids: Secondary | ICD-10-CM | POA: Insufficient documentation

## 2019-06-12 DIAGNOSIS — K449 Diaphragmatic hernia without obstruction or gangrene: Secondary | ICD-10-CM | POA: Diagnosis not present

## 2019-06-12 DIAGNOSIS — Z8489 Family history of other specified conditions: Secondary | ICD-10-CM | POA: Insufficient documentation

## 2019-06-12 DIAGNOSIS — Z8349 Family history of other endocrine, nutritional and metabolic diseases: Secondary | ICD-10-CM | POA: Insufficient documentation

## 2019-06-12 LAB — CBG MONITORING, ED: Glucose-Capillary: 173 mg/dL — ABNORMAL HIGH (ref 70–99)

## 2019-06-12 LAB — CBC
HCT: 37.7 % — ABNORMAL LOW (ref 39.0–52.0)
Hemoglobin: 11.9 g/dL — ABNORMAL LOW (ref 13.0–17.0)
MCH: 28.3 pg (ref 26.0–34.0)
MCHC: 31.6 g/dL (ref 30.0–36.0)
MCV: 89.8 fL (ref 80.0–100.0)
Platelets: 435 10*3/uL — ABNORMAL HIGH (ref 150–400)
RBC: 4.2 MIL/uL — ABNORMAL LOW (ref 4.22–5.81)
RDW: 15.8 % — ABNORMAL HIGH (ref 11.5–15.5)
WBC: 9.2 10*3/uL (ref 4.0–10.5)
nRBC: 0 % (ref 0.0–0.2)

## 2019-06-12 LAB — BASIC METABOLIC PANEL
Anion gap: 13 (ref 5–15)
BUN: 14 mg/dL (ref 6–20)
CO2: 23 mmol/L (ref 22–32)
Calcium: 9.2 mg/dL (ref 8.9–10.3)
Chloride: 100 mmol/L (ref 98–111)
Creatinine, Ser: 1.46 mg/dL — ABNORMAL HIGH (ref 0.61–1.24)
GFR calc Af Amer: >60 mL/min (ref >60)
GFR calc non Af Amer: 55 mL/min — ABNORMAL LOW (ref >60)
Glucose, Bld: 279 mg/dL — ABNORMAL HIGH (ref 70–99)
Potassium: 4.2 mmol/L (ref 3.5–5.1)
Sodium: 136 mmol/L (ref 135–145)

## 2019-06-12 LAB — D-DIMER, QUANTITATIVE: D-Dimer, Quant: 1.54 ug/mL-FEU — ABNORMAL HIGH (ref 0.00–0.50)

## 2019-06-12 LAB — TROPONIN I (HIGH SENSITIVITY)
Troponin I (High Sensitivity): 7 ng/L (ref <18)
Troponin I (High Sensitivity): 8 ng/L (ref <18)

## 2019-06-12 MED ORDER — ASPIRIN 81 MG PO CHEW
324.0000 mg | CHEWABLE_TABLET | Freq: Once | ORAL | Status: AC
  Administered 2019-06-12: 324 mg via ORAL
  Filled 2019-06-12: qty 4

## 2019-06-12 MED ORDER — ACETAMINOPHEN 325 MG PO TABS: 650.0000 mg | ORAL_TABLET | Freq: Four times a day (QID) | ORAL | Status: DC | PRN

## 2019-06-12 MED ORDER — SODIUM CHLORIDE 0.9 % IV SOLN: INTRAVENOUS | Status: DC

## 2019-06-12 MED ORDER — ENOXAPARIN SODIUM 120 MG/0.8ML ~~LOC~~ SOLN
110.0000 mg | Freq: Every day | SUBCUTANEOUS | Status: DC
  Administered 2019-06-13: 110 mg via SUBCUTANEOUS
  Filled 2019-06-12: qty 0.8

## 2019-06-12 MED ORDER — IOHEXOL 350 MG/ML SOLN
125.0000 mL | Freq: Once | INTRAVENOUS | Status: AC | PRN
  Administered 2019-06-12: 125 mL via INTRAVENOUS

## 2019-06-12 MED ORDER — SODIUM CHLORIDE 0.9% FLUSH
3.0000 mL | Freq: Once | INTRAVENOUS | Status: AC
  Administered 2019-06-12: 3 mL via INTRAVENOUS

## 2019-06-12 MED ORDER — ACETAMINOPHEN 650 MG RE SUPP: 650.0000 mg | Freq: Four times a day (QID) | RECTAL | Status: DC | PRN

## 2019-06-12 MED ORDER — INSULIN ASPART 100 UNIT/ML ~~LOC~~ SOLN
0.0000 [IU] | SUBCUTANEOUS | Status: DC
  Administered 2019-06-13 (×2): 2 [IU] via SUBCUTANEOUS
  Administered 2019-06-13: 1 [IU] via SUBCUTANEOUS
  Administered 2019-06-13: 2 [IU] via SUBCUTANEOUS

## 2019-06-12 NOTE — ED Notes (Signed)
Pt transported to CT ?

## 2019-06-12 NOTE — ED Notes (Signed)
Patient transported to X-ray 

## 2019-06-12 NOTE — ED Triage Notes (Signed)
C/o substernal chest pain that radiates to back since yesterday with SOB.  Pt wasn't tested for COVID but had symptoms and family COVID + the beginning of January.

## 2019-06-12 NOTE — ED Provider Notes (Signed)
[facility] Provider Note   CSN: [id number] Arrival date & time: 06/12/19  1851     History Chief Complaint  Patient presents with  . Chest Pain    Christopher Skinner is a 49 y.o. male.  Patient with history of morbid obesity, diabetes, hypertension, hyperlipidemia, sleep apnea noncompliant with CPAP presenting with 2-day history of chest pain.  Reports the pain starts in his low back and radiates up to his chest.  The last for 1 or 2 minutes at a time and then resolved on its own.  He is not having the pain currently.  The pain is associate with some shortness of breath.  There is no radiation to his neck or arms.  There is no cough or fever.  Denies any abdominal pain.  Right now he is having some pain in his upper back but no chest pain.  He denies any history of chronic back problems but does have arthritis.  He denies any focal weakness, numbness or tingling.  No bowel bladder incontinence.  Reports a negative stress test many years ago.  He saw his PCP for a virtual visit and was referred for an outpatient chest x-ray.  He was told he needed to have a CT scan of his chest to assess for blood clots but did not have this done yet.  The history is provided by the patient.       Past Medical History:  Diagnosis Date  . COVID-19   . Diabetes mellitus without complication ([place]) 0017  . GERD (gastroesophageal reflux disease)   . H/O exercise stress test 2009   reportedly normal per patient  . Hyperlipidemia   . Hypertension 2008  . Microalbuminuria 2019  . Obesity   . Sleep apnea    last used CPAP 2014; quit on his own    Patient Active Problem List   Diagnosis Date Noted  . SOB (shortness of breath) 06/08/2019  . Cough 06/08/2019  . Fatigue 06/08/2019  . Chronic pain of both knees 04/16/2019  . Screening for heart disease 10/05/2018  . Essential hypertension, benign 10/05/2018  . Hyperlipidemia 10/05/2018  . Microalbuminuria  10/05/2018  . Gastroesophageal reflux disease 10/05/2018  . Vaccine counseling 10/05/2018  . Type 2 diabetes mellitus with hyperglycemia ([place]) 10/05/2018  . OSA (obstructive sleep apnea) 10/05/2018  . Abnormal EKG 10/05/2018  . Severe obesity (BMI >= 40) ([place]) 10/26/2012    Past Surgical History:  Procedure Laterality Date  . CARPAL TUNNEL RELEASE     left  . COLONOSCOPY  2010   normal per patient  . HAND SURGERY     growth resection, right       Family History  Problem Relation Age of Onset  . Hyperlipidemia Other   . Hypertension Other   . Sleep apnea Other   . Obesity Other   . Stroke Mother   . Hypertension Mother   . Hypertension Father   . Hypertension Sister   . Hypertension Brother   . Hypertension Sister   . Hypertension Brother   . Hypertension Sister   . Hypertension Sister   . Hypertension Sister   . Hypertension Sister   . Hypertension Sister   . Hypertension Brother   . Hypertension Brother   . Hypertension Brother   . Hypertension Brother   . Hypertension Brother   . Hypertension Brother   . Hypertension Brother   . Hypertension Brother   . Hypertension Brother   . Hypertension  Brother   . Hypertension Brother   . Hypertension Brother   . Hypertension Brother   . Hypertension Brother   . Hypertension Brother   . Hypertension Sister   . Hypertension Sister   . Heart disease Neg Hx   . Cancer Neg Hx   . Diabetes Neg Hx     Social History   Tobacco Use  . Smoking status: Never Smoker  . Smokeless tobacco: Never Used  Substance Use Topics  . Alcohol use: No  . Drug use: No    Home Medications Prior to Admission medications   Medication Sig Start Date End Date Taking? Authorizing Provider  albuterol (PROVENTIL HFA;VENTOLIN HFA) 108 (90 Base) MCG/ACT inhaler Inhale 2 puffs into the lungs every 4 (four) hours as needed for wheezing or shortness of breath. 03/22/17   Lacretia Leigh, MD  albuterol (VENTOLIN HFA) 108 (90 Base) MCG/ACT  inhaler Inhale 2 puffs into the lungs every 6 (six) hours as needed for wheezing or shortness of breath. 05/26/19   Tysinger, [name], PA-C  azithromycin (ZITHROMAX) 250 MG tablet 2 tablets day 1, then 1 tablet days 2-4 Patient not taking: Reported on 06/08/2019 05/26/19   Tysinger, [name], PA-C  blood glucose meter kit and supplies KIT Dispense based on patient and insurance preference. Use 1-2 times daily as directed. (FOR ICD-10 DE11.69). 06/08/19   Tysinger, [name], PA-C  Blood Glucose Monitoring Suppl (TRUE METRIX METER) DEVI 1 Device by Does not apply route daily. 06/10/19   Tysinger, [name], PA-C  carvedilol (COREG) 3.125 MG tablet Take 1 tablet (3.125 mg total) by mouth 2 (two) times daily with a meal. 06/08/19   Tysinger, [name], PA-C  dapagliflozin propanediol (FARXIGA) 5 MG TABS tablet Take 5 mg by mouth daily. 04/19/19   Tysinger, [name], PA-C  glucose blood test strip True metrix strips to test 1-2 times daily 06/10/19   Tysinger, [name], PA-C  HYDROcodone-homatropine [facility]) 5-1.5 MG/5ML syrup Take 5 mLs by mouth every 8 (eight) hours as needed for up to 5 days. 06/08/19 06/13/19  Tysinger, [name], PA-C  Lancets MISC Use as instructed. Needs lancets based on pt's meter (true metrix) 06/10/19   Tysinger, [name], PA-C  losartan-hydrochlorothiazide (HYZAAR) 100-12.5 MG tablet Take 1 tablet by mouth daily. 10/06/18   Tysinger, [name], PA-C  metFORMIN (GLUCOPHAGE) 850 MG tablet Take 1 tablet (850 mg total) by mouth 2 (two) times daily with a meal. 04/19/19   Tysinger, [name], PA-C  predniSONE (DELTASONE) 20 MG tablet Take 2 tablets (40 mg total) by mouth daily with breakfast. 06/08/19   Tysinger, [name], PA-C  RABEprazole (ACIPHEX) 20 MG tablet Take 1 tablet (20 mg total) by mouth daily. 10/07/18   Tysinger, [name], PA-C  rosuvastatin (CRESTOR) 20 MG tablet Take 1 tablet (20 mg total) by mouth at bedtime. 10/06/18 10/06/19  Tysinger, [name], PA-C    Allergies    Patient has no known  allergies.  Review of Systems   Review of Systems  Constitutional: Negative for activity change, appetite change and fever.  HENT: Negative for congestion and rhinorrhea.   Eyes: Negative for visual disturbance.  Respiratory: Positive for chest tightness and shortness of breath.   Cardiovascular: Positive for chest pain.  Gastrointestinal: Negative for abdominal pain, nausea and vomiting.  Genitourinary: Negative for dysuria and hematuria.  Musculoskeletal: Positive for back pain.  Skin: Negative for rash.  Neurological: Negative for dizziness, weakness and headaches.   all other  systems are negative except as noted in the HPI and PMH.    Physical Exam Updated Vital Signs BP (!) 189/102   Pulse 99   Temp 98.3 F (36.8 C) (Oral)   Resp 19   SpO2 97%   Physical Exam Vitals and nursing note reviewed.  Constitutional:      Appearance: He is well-developed. He is obese. He is not ill-appearing.  HENT:     Head: Normocephalic and atraumatic.     Mouth/Throat:     Pharynx: No oropharyngeal exudate.  Eyes:     Conjunctiva/sclera: Conjunctivae normal.     Pupils: Pupils are equal, round, and reactive to light.  Neck:     Comments: No meningismus. Cardiovascular:     Rate and Rhythm: Normal rate and regular rhythm.     Heart sounds: Normal heart sounds. No murmur.     Comments: Equal DP pulses bilaterally Pulmonary:     Effort: Pulmonary effort is normal. No respiratory distress.     Breath sounds: Normal breath sounds.  Chest:     Chest wall: No tenderness.  Abdominal:     Palpations: Abdomen is soft.     Tenderness: There is no abdominal tenderness. There is no guarding or rebound.  Musculoskeletal:        General: No tenderness. Normal range of motion.     Cervical back: Normal range of motion and neck supple.  Skin:    General: Skin is warm.     Capillary Refill: Capillary refill takes less than 2 seconds.  Neurological:     General: No focal deficit present.      Mental Status: He is alert and oriented to person, place, and time. Mental status is at baseline.     Cranial Nerves: No cranial nerve deficit.     Motor: No abnormal muscle tone.     Coordination: Coordination normal.     Comments: No ataxia on finger to nose bilaterally. No pronator drift. 5/5 strength throughout. CN 2-12 intact.Equal grip strength. Sensation intact.   Psychiatric:        Behavior: Behavior normal.     ED Results / Procedures / Treatments   Labs (all labs ordered are listed, but only abnormal results are displayed) Labs Reviewed  BASIC METABOLIC PANEL - Abnormal; Notable for the following components:      Result Value   Glucose, Bld 279 (*)    Creatinine, Ser 1.46 (*)    GFR calc non Af Amer 55 (*)    All other components within normal limits  CBC - Abnormal; Notable for the following components:   RBC 4.20 (*)    Hemoglobin 11.9 (*)    HCT 37.7 (*)    RDW 15.8 (*)    Platelets 435 (*)    All other components within normal limits  D-DIMER, QUANTITATIVE (NOT AT [facility]) - Abnormal; Notable for the following components:   D-Dimer, Quant 1.54 (*)    All other components within normal limits  TROPONIN I (HIGH SENSITIVITY)  TROPONIN I (HIGH SENSITIVITY)    EKG EKG Interpretation  Date/Time:  Saturday June 12 2019 19:02:12 EST Ventricular Rate:  111 PR Interval:  194 QRS Duration: 96 QT Interval:  346 QTC Calculation: 470 R Axis:   27 Text Interpretation: Sinus tachycardia Incomplete right bundle branch block Cannot rule out Anterior infarct , age undetermined Abnormal ECG No significant change was found Confirmed by Ezequiel Essex [phone]) on 06/12/2019 7:52:17 PM   Radiology DG Chest 2  View  Result Date: 06/12/2019 CLINICAL DATA:  Patient c/o chest pain SOB as well as back pain x's 2 days EXAM: CHEST - 2 VIEW COMPARISON:  Chest radiograph 06/09/2019 FINDINGS: Stable cardiomediastinal contours with normal heart size. The lungs are clear. No  pneumothorax or pleural effusion. There is flowing osteophytosis throughout the thoracic spine. IMPRESSION: No acute cardiopulmonary disease. Electronically Signed   By: Audie Pinto M.D.   On: 06/12/2019 19:47   CT Angio Chest/Abd/Pel for Dissection W and/or Wo Contrast  Result Date: 06/12/2019 CLINICAL DATA:  Acute aortic syndrome suspected EXAM: CT ANGIOGRAPHY CHEST, ABDOMEN AND PELVIS TECHNIQUE: Multidetector CT imaging through the chest, abdomen and pelvis was performed using the standard protocol during bolus administration of intravenous contrast. Multiplanar reconstructed images and MIPs were obtained and reviewed to evaluate the vascular anatomy. CONTRAST:  154m OMNIPAQUE IOHEXOL 350 MG/ML SOLN COMPARISON:  Chest radiograph 06/12/2019 FINDINGS: Imaging quality is severely degraded due to patient body habitus. Angiographic images are centrally nondiagnostic for evaluation of the aorta. CTA CHEST FINDINGS Cardiovascular: Suboptimal visualization of the aorta both on noncontrast and postcontrast imaging due to extensive photon starvation secondary to body habitus. Images are essentially nondiagnostic for evaluation of luminal abnormality. No periaortic stranding, hemorrhage or gas is seen. Normal caliber thoracic aorta. Shared origin of the brachiocephalic and left common carotid arteries. Cardiac size at the upper limits of normal. Central pulmonary arteries are normal caliber with luminal evaluation precluded by degraded imaging quality. Mediastinum/Nodes: No mediastinal fluid or gas. Normal thyroid gland and thoracic inlet. No acute abnormality of the trachea or esophagus. No worrisome mediastinal, hilar or axillary adenopathy. Lungs/Pleura: No consolidation, features of edema, pneumothorax, or effusion. No suspicious pulmonary nodules or masses. Musculoskeletal: Multilevel degenerative changes are present in the imaged portions of the spine. Some flowing anterior osteophytosis may be reflective  of diffuse a pathic skeletal hyperostosis (DISH). Review of the MIP images confirms the above findings. CTA ABDOMEN AND PELVIS FINDINGS VASCULAR Evaluation of the abdominopelvic vasculature is severely degraded due to patient body habitus and resulting photon starvation artifact. Aorta: No gross aneurysmal dilatation. Luminal evaluation is precluded. Celiac: Poorly visualized.  No gross aneurysmal dilation. SMA: Poorly visualized.  Normal course and caliber of the vessel. Renals: Poorly visualized IMA: Poorly visualized Inflow: Poorly visualized. Appears opacified normally without aneurysm or ectasia. Luminal evaluation precluded. Veins: Major venous structures are unremarkable. Review of the MIP images confirms the above findings. NON-VASCULAR Hepatobiliary: No gross hepatic abnormality. Hepatomegaly is noted with liver measuring nearly 30 cm craniocaudal. Gallbladder i and biliary tree are poorly visualized. Pancreas: Grossly normal appearance of the pancreas Spleen: Normal splenic size.  No gross splenic abnormality. Adrenals/Urinary Tract: Normal adrenal glands. Kidneys are normal and symmetric. No visible or contour deforming renal lesions. No evident hydronephrosis. Urinary bladder is grossly unremarkable. Stomach/Bowel: Small hiatal hernia. Stomach and duodenal sweep are unremarkable. No evidence of small-bowel obstruction. No frank dilatation or wall thickening. Appendix is poorly visualized. No colonic dilatation or wall thickening. Scattered colonic diverticula without focal pericolonic inflammation to suggest diverticulitis. No colonic thickening or dilatation. Lymphatic: No suspicious or enlarged lymph nodes in the included lymphatic chains. Reproductive: Dense The prostate and seminal vesicles are unremarkable. Other: No free fluid. No free air. No bowel containing hernias. Extensive body wall edema. Incomplete inclusion artifact as well as photon starvation significantly degrade imaging quality.  Musculoskeletal: Multilevel degenerative changes are present in the imaged portions of the spine. No acute osseous abnormality or suspicious osseous lesion. Review of  the MIP images confirms the above findings. IMPRESSION: 1. Patient's body habitus results in significant photon starvation and incomplete inclusion artifact with multidirectional beam hardening throughout the chest, abdomen and pelvis. This significantly degrades imaging quality in limits diagnostic utility of this angiographic study. 2. Luminal evaluation of the aorta and branch vessels is severely limited. No gross aneurysmal change or periaortic inflammation is seen. 3. Evaluation of the pulmonary arteries is nondiagnostic. 4. Hepatomegaly. 5. Mild cardiomegaly. 6. Aortic Atherosclerosis (ICD10-I70.0). These results and limitations of this examination were called by telephone at the time of interpretation on 06/12/2019 at 11:06 pm to provider [facility] , who verbally acknowledged these results. Electronically Signed   By: Lovena Le M.D.   On: 06/12/2019 23:08    Procedures Procedures (including critical care time)  Medications Ordered in ED Medications  sodium chloride flush (NS) 0.9 % injection 3 mL (3 mLs Intravenous Given 06/12/19 2042)  aspirin chewable tablet 324 mg (324 mg Oral Given 06/12/19 2032)    ED Course  I have reviewed the triage vital signs and the nursing notes.  Pertinent labs & imaging results that were available during my care of the patient were reviewed by me and considered in my medical decision making (see chart for details).    MDM Rules/Calculators/A&P                     Intermittent chest pain with shortness of breath for the past 2 days.  Pain starts in his low back and radiates up to his chest.  His description is rather atypical but he is hypertensive and morbidly obese with risk factors of hypertension, hyperlipidemia, and diabetes.  EKG without acute ischemia.  Troponin negative.  Chest  x-ray negative.  Heart score is 4.  Patient with no chest pain currently.  With chest and back pain, CTA will be obtained to evaluate for aortic dissection though patient appears very comfortable and is currently pain free.  Call from radiology Dr. Marguerita Merles.  CT angiogram study is essentially nondiagnostic due to patient's body habitus and machine penetrance.  Unable to assess for aortic dissection or pulmonary embolism. Not a contrast timing issue. Dr. Marguerita Merles does not think repeat scanning will be beneficial. May require formal angiogram or MRA.  On recheck, patient is resting comfortably and denies any chest pain or shortness of breath or back pain.  Aortic dissection is considered less likely at this time. He has equal upper extremity blood pressures that are elevated.  D-dimer is mildly elevated at 1.5  Patient may be a candidate for VQ scan in the morning.  He is not having any chest pain currently. He is agreeable to overnight admission for cardiac rule out.  Results of CTA discussed with patient as well as Dr. Maudie Mercury who accepts admission.  Final Clinical Impression(s) / ED Diagnoses Final diagnoses:  Nonspecific chest pain    Rx / DC Orders ED Discharge Orders    None       Caylin Raby, Annie Main, MD 06/13/19 [phone]

## 2019-06-13 ENCOUNTER — Encounter (HOSPITAL_COMMUNITY): Payer: Self-pay | Admitting: Internal Medicine

## 2019-06-13 ENCOUNTER — Observation Stay (HOSPITAL_COMMUNITY): Payer: 59

## 2019-06-13 ENCOUNTER — Observation Stay (HOSPITAL_BASED_OUTPATIENT_CLINIC_OR_DEPARTMENT_OTHER): Payer: 59

## 2019-06-13 DIAGNOSIS — R079 Chest pain, unspecified: Secondary | ICD-10-CM

## 2019-06-13 DIAGNOSIS — M545 Low back pain: Secondary | ICD-10-CM

## 2019-06-13 DIAGNOSIS — I10 Essential (primary) hypertension: Secondary | ICD-10-CM | POA: Diagnosis not present

## 2019-06-13 DIAGNOSIS — R0602 Shortness of breath: Secondary | ICD-10-CM

## 2019-06-13 LAB — RESPIRATORY PANEL BY RT PCR (FLU A&B, COVID)
Influenza A by PCR: NEGATIVE
Influenza B by PCR: NEGATIVE
SARS Coronavirus 2 by RT PCR: NEGATIVE

## 2019-06-13 LAB — URINALYSIS, ROUTINE W REFLEX MICROSCOPIC
Bacteria, UA: NONE SEEN
Bilirubin Urine: NEGATIVE
Glucose, UA: 50 mg/dL — AB
Hgb urine dipstick: NEGATIVE
Ketones, ur: NEGATIVE mg/dL
Leukocytes,Ua: NEGATIVE
Nitrite: NEGATIVE
Protein, ur: 100 mg/dL — AB
Specific Gravity, Urine: 1.039 — ABNORMAL HIGH (ref 1.005–1.030)
pH: 5 (ref 5.0–8.0)

## 2019-06-13 LAB — ECHOCARDIOGRAM COMPLETE
Height: 69 in
Weight: 7888 oz

## 2019-06-13 LAB — COMPREHENSIVE METABOLIC PANEL
ALT: 31 U/L (ref 0–44)
AST: 26 U/L (ref 15–41)
Albumin: 3.2 g/dL — ABNORMAL LOW (ref 3.5–5.0)
Alkaline Phosphatase: 65 U/L (ref 38–126)
Anion gap: 12 (ref 5–15)
BUN: 13 mg/dL (ref 6–20)
CO2: 23 mmol/L (ref 22–32)
Calcium: 9.3 mg/dL (ref 8.9–10.3)
Chloride: 102 mmol/L (ref 98–111)
Creatinine, Ser: 1.24 mg/dL (ref 0.61–1.24)
GFR calc Af Amer: >60 mL/min (ref >60)
GFR calc non Af Amer: >60 mL/min (ref >60)
Glucose, Bld: 159 mg/dL — ABNORMAL HIGH (ref 70–99)
Potassium: 3.8 mmol/L (ref 3.5–5.1)
Sodium: 137 mmol/L (ref 135–145)
Total Bilirubin: 0.5 mg/dL (ref 0.3–1.2)
Total Protein: 7.9 g/dL (ref 6.5–8.1)

## 2019-06-13 LAB — RAPID URINE DRUG SCREEN, HOSP PERFORMED
Amphetamines: NOT DETECTED
Barbiturates: NOT DETECTED
Benzodiazepines: NOT DETECTED
Cocaine: NOT DETECTED
Opiates: POSITIVE — AB
Tetrahydrocannabinol: NOT DETECTED

## 2019-06-13 LAB — CBC
HCT: 35.9 % — ABNORMAL LOW (ref 39.0–52.0)
Hemoglobin: 11.5 g/dL — ABNORMAL LOW (ref 13.0–17.0)
MCH: 28.9 pg (ref 26.0–34.0)
MCHC: 32 g/dL (ref 30.0–36.0)
MCV: 90.2 fL (ref 80.0–100.0)
Platelets: 407 10*3/uL — ABNORMAL HIGH (ref 150–400)
RBC: 3.98 MIL/uL — ABNORMAL LOW (ref 4.22–5.81)
RDW: 15.9 % — ABNORMAL HIGH (ref 11.5–15.5)
WBC: 10.1 10*3/uL (ref 4.0–10.5)
nRBC: 0.2 % (ref 0.0–0.2)

## 2019-06-13 LAB — LIPID PANEL
Cholesterol: 203 mg/dL — ABNORMAL HIGH (ref 0–200)
HDL: 39 mg/dL — ABNORMAL LOW (ref >40)
LDL Cholesterol: 146 mg/dL — ABNORMAL HIGH (ref 0–99)
Total CHOL/HDL Ratio: 5.2 RATIO
Triglycerides: 90 mg/dL (ref <150)
VLDL: 18 mg/dL (ref 0–40)

## 2019-06-13 LAB — GLUCOSE, CAPILLARY
Glucose-Capillary: 151 mg/dL — ABNORMAL HIGH (ref 70–99)
Glucose-Capillary: 134 mg/dL — ABNORMAL HIGH (ref 70–99)
Glucose-Capillary: 200 mg/dL — ABNORMAL HIGH (ref 70–99)

## 2019-06-13 LAB — TROPONIN I (HIGH SENSITIVITY): Troponin I (High Sensitivity): 10 ng/L (ref <18)

## 2019-06-13 LAB — HIV ANTIBODY (ROUTINE TESTING W REFLEX): HIV Screen 4th Generation wRfx: NONREACTIVE

## 2019-06-13 LAB — HEMOGLOBIN A1C
Hgb A1c MFr Bld: 8 % — ABNORMAL HIGH (ref 4.8–5.6)
Mean Plasma Glucose: 182.9 mg/dL

## 2019-06-13 MED ORDER — AMLODIPINE BESYLATE 5 MG PO TABS
5.0000 mg | ORAL_TABLET | Freq: Every day | ORAL | Status: DC
  Administered 2019-06-13: 5 mg via ORAL
  Filled 2019-06-13: qty 1

## 2019-06-13 MED ORDER — ALBUTEROL SULFATE (2.5 MG/3ML) 0.083% IN NEBU: 2.5000 mg | INHALATION_SOLUTION | RESPIRATORY_TRACT | Status: DC | PRN

## 2019-06-13 MED ORDER — ASPIRIN EC 81 MG PO TBEC
81.0000 mg | DELAYED_RELEASE_TABLET | Freq: Every day | ORAL | Status: DC
  Administered 2019-06-13: 81 mg via ORAL
  Filled 2019-06-13: qty 1

## 2019-06-13 MED ORDER — METFORMIN HCL 850 MG PO TABS: 850.0000 mg | ORAL_TABLET | Freq: Two times a day (BID) | ORAL | 3 refills | Status: DC

## 2019-06-13 MED ORDER — CARVEDILOL 3.125 MG PO TABS
3.1250 mg | ORAL_TABLET | Freq: Two times a day (BID) | ORAL | Status: DC
  Administered 2019-06-13: 3.125 mg via ORAL
  Filled 2019-06-13: qty 1

## 2019-06-13 MED ORDER — HYDRALAZINE HCL 20 MG/ML IJ SOLN
10.0000 mg | Freq: Four times a day (QID) | INTRAMUSCULAR | Status: DC | PRN
  Administered 2019-06-13: 10 mg via INTRAVENOUS
  Filled 2019-06-13: qty 1

## 2019-06-13 MED ORDER — LOSARTAN POTASSIUM 50 MG PO TABS
100.0000 mg | ORAL_TABLET | Freq: Every day | ORAL | Status: DC
  Administered 2019-06-13: 100 mg via ORAL
  Filled 2019-06-13: qty 2

## 2019-06-13 MED ORDER — HYDROCHLOROTHIAZIDE 12.5 MG PO CAPS
12.5000 mg | ORAL_CAPSULE | Freq: Every day | ORAL | Status: DC
  Administered 2019-06-13: 12.5 mg via ORAL
  Filled 2019-06-13: qty 1

## 2019-06-13 MED ORDER — HYDROCODONE-HOMATROPINE 5-1.5 MG/5ML PO SYRP: 5.0000 mL | ORAL_SOLUTION | Freq: Three times a day (TID) | ORAL | Status: DC | PRN

## 2019-06-13 MED ORDER — LOSARTAN POTASSIUM-HCTZ 100-12.5 MG PO TABS: 1.0000 | ORAL_TABLET | Freq: Every day | ORAL | Status: DC

## 2019-06-13 MED ORDER — CARVEDILOL 6.25 MG PO TABS: 6.2500 mg | ORAL_TABLET | Freq: Two times a day (BID) | ORAL | 0 refills | Status: DC

## 2019-06-13 MED ORDER — CARVEDILOL 6.25 MG PO TABS: 6.2500 mg | ORAL_TABLET | Freq: Two times a day (BID) | ORAL | Status: DC

## 2019-06-13 MED ORDER — ROSUVASTATIN CALCIUM 20 MG PO TABS: 20.0000 mg | ORAL_TABLET | Freq: Every day | ORAL | Status: DC

## 2019-06-13 MED ORDER — PANTOPRAZOLE SODIUM 40 MG PO TBEC
40.0000 mg | DELAYED_RELEASE_TABLET | Freq: Every day | ORAL | Status: DC
  Administered 2019-06-13: 40 mg via ORAL
  Filled 2019-06-13: qty 1

## 2019-06-13 MED ORDER — LABETALOL HCL 5 MG/ML IV SOLN
10.0000 mg | Freq: Once | INTRAVENOUS | Status: AC
  Administered 2019-06-13: 10 mg via INTRAVENOUS
  Filled 2019-06-13: qty 4

## 2019-06-13 NOTE — H&P (Addendum)
TRH H&P    Patient Demographics:    Christopher Skinner, is a 49 y.o. male  MRN: [id number]  DOB - 07/18/1970  Admit Date - 06/12/2019  Referring MD/NP/PA: Charolotte Capuchin  Outpatient Primary MD for the patient is Tysinger, [name], PA-C  Patient coming from:  home  Chief complaint- back pain   HPI:    Christopher Skinner  is a 49 y.o. male, w hypertension, hyperlipidemia, Dm2, OSA, recent covid-19 infection 05/02/2019 w Chest pain from his lower back into his chest,  "sharp" "central"  Typically lasting a few minutes,  Started Friday.   Pt initially was at rest on Friday morning when the chest pain started.  Slight dry cough.  Pt denies fever, chills, palp, sob, n/v, diarrhea, brbpr, black stool, dysuria, hematuria.   In ED,  T 98.3  P 110, R 18, Bp 170/96  Pox 99% on RA  Na 136, K 4.2, Bun 14, Creatinine 1.46 Wbc 9.2, hgb 11.9, Plt 435  D dimer 1.54,  Trop 7-> 8  CTA chest/ abd/ pelvis IMPRESSION: 1. Patient's body habitus results in significant photon starvation and incomplete inclusion artifact with multidirectional beam hardening throughout the chest, abdomen and pelvis. This significantly degrades imaging quality in limits diagnostic utility of this angiographic study. 2. Luminal evaluation of the aorta and branch vessels is severely limited. No gross aneurysmal change or periaortic inflammation is seen. 3. Evaluation of the pulmonary arteries is nondiagnostic. 4. Hepatomegaly. 5. Mild cardiomegaly.  EKG st at 110, borderline LAD, no st-t changes c/w ischemia  Pt will be admitted for w/up of chest pain     Review of systems:    In addition to the HPI above,  No Fever-chills, No Headache, No changes with Vision or hearing, No problems swallowing food or Liquids,   No Abdominal pain, No Nausea or Vomiting, bowel movements are regular, No Blood in stool or Urine, No dysuria, No new  skin rashes or bruises, No new joints pains-aches,  No new weakness, tingling, numbness in any extremity, No recent weight gain or loss, No polyuria, polydypsia or polyphagia, No significant Mental Stressors.  All other systems reviewed and are negative.    Past History of the following :    Past Medical History:  Diagnosis Date  . COVID-19 05/02/2019  . Diabetes mellitus without complication ([place]) 5176  . GERD (gastroesophageal reflux disease)   . H/O exercise stress test 2009   reportedly normal per patient  . Hyperlipidemia   . Hypertension 2008  . Microalbuminuria 2019  . Obesity   . Sleep apnea    last used CPAP 2014; quit on his own      Past Surgical History:  Procedure Laterality Date  . CARPAL TUNNEL RELEASE     left  . COLONOSCOPY  2010   normal per patient  . HAND SURGERY     growth resection, right      Social History:      Social History   Tobacco Use  . Smoking status: Never Smoker  . Smokeless  tobacco: Never Used  Substance Use Topics  . Alcohol use: No       Family History :     Family History  Problem Relation Age of Onset  . Hyperlipidemia Other   . Hypertension Other   . Sleep apnea Other   . Obesity Other   . Stroke Mother   . Hypertension Mother   . Hypertension Father   . Hypertension Sister   . Hypertension Brother   . Hypertension Sister   . Hypertension Brother   . Hypertension Sister   . Hypertension Sister   . Hypertension Sister   . Hypertension Sister   . Hypertension Sister   . Hypertension Brother   . Hypertension Brother   . Hypertension Brother   . Hypertension Brother   . Hypertension Brother   . Hypertension Brother   . Hypertension Brother   . Hypertension Brother   . Hypertension Brother   . Hypertension Brother   . Hypertension Brother   . Hypertension Brother   . Hypertension Brother   . Hypertension Brother   . Hypertension Brother   . Hypertension Sister   . Hypertension Sister   .  Heart disease Neg Hx   . Cancer Neg Hx   . Diabetes Neg Hx        Home Medications:   Prior to Admission medications   Medication Sig Start Date End Date Taking? Authorizing Provider  albuterol (PROVENTIL HFA;VENTOLIN HFA) 108 (90 Base) MCG/ACT inhaler Inhale 2 puffs into the lungs every 4 (four) hours as needed for wheezing or shortness of breath. 03/22/17  Yes Lacretia Leigh, MD  carvedilol (COREG) 3.125 MG tablet Take 1 tablet (3.125 mg total) by mouth 2 (two) times daily with a meal. 06/08/19  Yes Tysinger, [name], PA-C  dapagliflozin propanediol (FARXIGA) 5 MG TABS tablet Take 5 mg by mouth daily. 04/19/19  Yes Tysinger, [name], PA-C  HYDROcodone-homatropine (HYCODAN) 5-1.5 MG/5ML syrup Take 5 mLs by mouth every 8 (eight) hours as needed for up to 5 days. 06/08/19 06/13/19 Yes Tysinger, [name], PA-C  losartan-hydrochlorothiazide (HYZAAR) 100-12.5 MG tablet Take 1 tablet by mouth daily. 10/06/18  Yes Tysinger, [name], PA-C  metFORMIN (GLUCOPHAGE) 850 MG tablet Take 1 tablet (850 mg total) by mouth 2 (two) times daily with a meal. 04/19/19  Yes Tysinger, [name], PA-C  predniSONE (DELTASONE) 20 MG tablet Take 2 tablets (40 mg total) by mouth daily with breakfast. 06/08/19  Yes Tysinger, [name], PA-C  RABEprazole (ACIPHEX) 20 MG tablet Take 1 tablet (20 mg total) by mouth daily. 10/07/18  Yes Tysinger, [name], PA-C  rosuvastatin (CRESTOR) 20 MG tablet Take 1 tablet (20 mg total) by mouth at bedtime. 10/06/18 10/06/19 Yes Tysinger, [name], PA-C  albuterol (VENTOLIN HFA) 108 (90 Base) MCG/ACT inhaler Inhale 2 puffs into the lungs every 6 (six) hours as needed for wheezing or shortness of breath. Patient not taking: Reported on 06/12/2019 05/26/19   Tysinger, [name], PA-C  azithromycin [facility]) 250 MG tablet 2 tablets day 1, then 1 tablet days 2-4 Patient not taking: Reported on 06/08/2019 05/26/19   Tysinger, [name], PA-C  blood glucose meter kit and supplies KIT Dispense based on patient and  insurance preference. Use 1-2 times daily as directed. (FOR ICD-10 DE11.69). 06/08/19   Tysinger, [name], PA-C  Blood Glucose Monitoring Suppl (TRUE METRIX METER) DEVI 1 Device by Does not apply route daily. 06/10/19   Tysinger, [name], PA-C  glucose blood test  strip True metrix strips to test 1-2 times daily 06/10/19   Tysinger, [name], PA-C  Lancets MISC Use as instructed. Needs lancets based on pt's meter (true metrix) 06/10/19   Tysinger, [name], PA-C     Allergies:    No Known Allergies   Physical Exam:   Vitals  Blood pressure (!) 193/107, pulse (!) 102, temperature 98.3 F (36.8 C), temperature source Oral, resp. rate 14, SpO2 98 %.  1.  General: axoxo3  2. Psychiatric: euthymic  3. Neurologic: Nonfocal, cn2-12 intact, reflexes 2+ symmetric, diffuse with no clonus, motor 5/5 in all 4 ext  4. HEENMT:  Anicteric, pupils 1.45m symmetric, direct, consensual intact Neck: no jvd, no bruit  5. Respiratory : CTAB  6. Cardiovascular : rrr s1, s2, no m/g/r  7. Gastrointestinal:  Abd: soft, morbidly obese, nt, nd, +bs  8. Skin:  Ext: no c/c/e,  No rash  9.Musculoskeletal:  Good ROM     Data Review:    CBC Recent Labs  Lab 06/10/19 1402 06/12/19 1922  WBC 7.9 9.2  HGB 11.5* 11.9*  HCT 35.1* 37.7*  PLT 389 435*  MCV 87 89.8  MCH 28.5 28.3  MCHC 32.8 31.6  RDW 15.1 15.8*  LYMPHSABS 1.9  --   EOSABS 0.1  --   BASOSABS 0.0  --    ------------------------------------------------------------------------------------------------------------------  Results for orders placed or performed during the hospital encounter of 06/12/19 (from the past 48 hour(s))  Basic metabolic panel     Status: Abnormal   Collection Time: 06/12/19  7:22 PM  Result Value Ref Range   Sodium 136 135 - 145 mmol/L   Potassium 4.2 3.5 - 5.1 mmol/L   Chloride 100 98 - 111 mmol/L   CO2 23 22 - 32 mmol/L   Glucose, Bld 279 (H) 70 - 99 mg/dL   BUN 14 6 - 20 mg/dL   Creatinine, Ser 1.46  (H) 0.61 - 1.24 mg/dL   Calcium 9.2 8.9 - 10.3 mg/dL   GFR calc non Af Amer 55 (L) >60 mL/min   GFR calc Af Amer >60 >60 mL/min   Anion gap 13 5 - 15    Comment: Performed at [facility] [address], [place] [address] CBC     Status: Abnormal   Collection Time: 06/12/19  7:22 PM  Result Value Ref Range   WBC 9.2 4.0 - 10.5 K/uL   RBC 4.20 (L) 4.22 - 5.81 MIL/uL   Hemoglobin 11.9 (L) 13.0 - 17.0 g/dL   HCT 37.7 (L) 39.0 - 52.0 %   MCV 89.8 80.0 - 100.0 fL   MCH 28.3 26.0 - 34.0 pg   MCHC 31.6 30.0 - 36.0 g/dL   RDW 15.8 (H) 11.5 - 15.5 %   Platelets 435 (H) 150 - 400 K/uL   nRBC 0.0 0.0 - 0.2 %    Comment: Performed at [facility] [place] [id number] Troponin I (High Sensitivity)     Status: None   Collection Time: 06/12/19  7:22 PM  Result Value Ref Range   Troponin I (High Sensitivity) 7 <18 ng/L    Comment: (NOTE) Elevated high sensitivity troponin I (hsTnI) values and significant  changes across serial measurements may suggest ACS but many other  chronic and acute conditions are known to elevate hsTnI results.  Refer to the "Links" section for chest pain algorithms and additional  guidance. Performed at [facility], [address] [place], [place] [address]   D-dimer, quantitative (not at [facility])     Status: Abnormal   Collection Time: 06/12/19  7:22 PM  Result Value Ref Range   D-Dimer, Quant 1.54 (H) 0.00 - 0.50 ug/mL-FEU    Comment: (NOTE) At the manufacturer cut-off of 0.50 ug/mL FEU, this assay has been documented to exclude PE with a sensitivity and negative predictive value of 97 to 99%.  At this time, this assay has not been approved by the FDA to exclude DVT/VTE. Results should be correlated with clinical presentation. Performed at [facility], [place] [address] [place], [place] [address]   Troponin I (High Sensitivity)     Status: None   Collection Time: 06/12/19  9:15 PM  Result Value Ref  Range   Troponin I (High Sensitivity) 8 <18 ng/L    Comment: (NOTE) Elevated high sensitivity troponin I (hsTnI) values and significant  changes across serial measurements may suggest ACS but many other  chronic and acute conditions are known to elevate hsTnI results.  Refer to the "Links" section for chest pain algorithms and additional  guidance. Performed at [facility], [place] [address] [place],  [address]   CBG monitoring, ED     Status: Abnormal   Collection Time: 06/12/19 11:53 PM  Result Value Ref Range   Glucose-Capillary 173 (H) 70 - 99 mg/dL    Chemistries  Recent Labs  Lab 06/10/19 1402 06/12/19 1922  NA 139 136  K 4.1 4.2  CL 100 100  CO2 24 23  GLUCOSE 129* 279*  BUN 12 14  CREATININE 1.50* 1.46*  CALCIUM 9.7 9.2   ------------------------------------------------------------------------------------------------------------------  ------------------------------------------------------------------------------------------------------------------ GFR: Estimated Creatinine Clearance: 112.8 mL/min (A) (by C-G formula based on SCr of 1.46 mg/dL (H)). Liver Function Tests: No results for input(s): AST, ALT, ALKPHOS, BILITOT, PROT, ALBUMIN in the last 168 hours. No results for input(s): LIPASE, AMYLASE in the last 168 hours. No results for input(s): AMMONIA in the last 168 hours. Coagulation Profile: No results for input(s): INR, PROTIME in the last 168 hours. Cardiac Enzymes: No results for input(s): CKTOTAL, CKMB, CKMBINDEX, TROPONINI in the last 168 hours. BNP (last 3 results) No results for input(s): PROBNP in the last 8760 hours. HbA1C: No results for input(s): HGBA1C in the last 72 hours. CBG: Recent Labs  Lab 06/12/19 2353  GLUCAP 173*   Lipid Profile: No results for input(s): CHOL, HDL, LDLCALC, TRIG, CHOLHDL, LDLDIRECT in the last 72 hours. Thyroid Function Tests: No results for input(s): TSH, T4TOTAL, FREET4, T3FREE, THYROIDAB in the  last 72 hours. Anemia Panel: No results for input(s): VITAMINB12, FOLATE, FERRITIN, TIBC, IRON, RETICCTPCT in the last 72 hours.  --------------------------------------------------------------------------------------------------------------- Urine analysis: No results found for: COLORURINE, APPEARANCEUR, LABSPEC, PHURINE, GLUCOSEU, HGBUR, BILIRUBINUR, KETONESUR, PROTEINUR, UROBILINOGEN, NITRITE, LEUKOCYTESUR    Imaging Results:    DG Chest 2 View  Result Date: 06/12/2019 CLINICAL DATA:  Patient c/o chest pain SOB as well as back pain x's 2 days EXAM: CHEST - 2 VIEW COMPARISON:  Chest radiograph 06/09/2019 FINDINGS: Stable cardiomediastinal contours with normal heart size. The lungs are clear. No pneumothorax or pleural effusion. There is flowing osteophytosis throughout the thoracic spine. IMPRESSION: No acute cardiopulmonary disease. Electronically Signed   By: Audie Pinto M.D.   On: 06/12/2019 19:47   CT Angio Chest/Abd/Pel for Dissection W and/or Wo Contrast  Result Date: 06/12/2019 CLINICAL DATA:  Acute aortic syndrome suspected EXAM: CT ANGIOGRAPHY CHEST, ABDOMEN AND PELVIS TECHNIQUE: Multidetector  CT imaging through the chest, abdomen and pelvis was performed using the standard protocol during bolus administration of intravenous contrast. Multiplanar reconstructed images and MIPs were obtained and reviewed to evaluate the vascular anatomy. CONTRAST:  137m OMNIPAQUE IOHEXOL 350 MG/ML SOLN COMPARISON:  Chest radiograph 06/12/2019 FINDINGS: Imaging quality is severely degraded due to patient body habitus. Angiographic images are centrally nondiagnostic for evaluation of the aorta. CTA CHEST FINDINGS Cardiovascular: Suboptimal visualization of the aorta both on noncontrast and postcontrast imaging due to extensive photon starvation secondary to body habitus. Images are essentially nondiagnostic for evaluation of luminal abnormality. No periaortic stranding, hemorrhage or gas is seen. Normal  caliber thoracic aorta. Shared origin of the brachiocephalic and left common carotid arteries. Cardiac size at the upper limits of normal. Central pulmonary arteries are normal caliber with luminal evaluation precluded by degraded imaging quality. Mediastinum/Nodes: No mediastinal fluid or gas. Normal thyroid gland and thoracic inlet. No acute abnormality of the trachea or esophagus. No worrisome mediastinal, hilar or axillary adenopathy. Lungs/Pleura: No consolidation, features of edema, pneumothorax, or effusion. No suspicious pulmonary nodules or masses. Musculoskeletal: Multilevel degenerative changes are present in the imaged portions of the spine. Some flowing anterior osteophytosis may be reflective of diffuse a pathic skeletal hyperostosis (DISH). Review of the MIP images confirms the above findings. CTA ABDOMEN AND PELVIS FINDINGS VASCULAR Evaluation of the abdominopelvic vasculature is severely degraded due to patient body habitus and resulting photon starvation artifact. Aorta: No gross aneurysmal dilatation. Luminal evaluation is precluded. Celiac: Poorly visualized.  No gross aneurysmal dilation. SMA: Poorly visualized.  Normal course and caliber of the vessel. Renals: Poorly visualized IMA: Poorly visualized Inflow: Poorly visualized. Appears opacified normally without aneurysm or ectasia. Luminal evaluation precluded. Veins: Major venous structures are unremarkable. Review of the MIP images confirms the above findings. NON-VASCULAR Hepatobiliary: No gross hepatic abnormality. Hepatomegaly is noted with liver measuring nearly 30 cm craniocaudal. Gallbladder i and biliary tree are poorly visualized. Pancreas: Grossly normal appearance of the pancreas Spleen: Normal splenic size.  No gross splenic abnormality. Adrenals/Urinary Tract: Normal adrenal glands. Kidneys are normal and symmetric. No visible or contour deforming renal lesions. No evident hydronephrosis. Urinary bladder is grossly unremarkable.  Stomach/Bowel: Small hiatal hernia. Stomach and duodenal sweep are unremarkable. No evidence of small-bowel obstruction. No frank dilatation or wall thickening. Appendix is poorly visualized. No colonic dilatation or wall thickening. Scattered colonic diverticula without focal pericolonic inflammation to suggest diverticulitis. No colonic thickening or dilatation. Lymphatic: No suspicious or enlarged lymph nodes in the included lymphatic chains. Reproductive: Dense The prostate and seminal vesicles are unremarkable. Other: No free fluid. No free air. No bowel containing hernias. Extensive body wall edema. Incomplete inclusion artifact as well as photon starvation significantly degrade imaging quality. Musculoskeletal: Multilevel degenerative changes are present in the imaged portions of the spine. No acute osseous abnormality or suspicious osseous lesion. Review of the MIP images confirms the above findings. IMPRESSION: 1. Patient's body habitus results in significant photon starvation and incomplete inclusion artifact with multidirectional beam hardening throughout the chest, abdomen and pelvis. This significantly degrades imaging quality in limits diagnostic utility of this angiographic study. 2. Luminal evaluation of the aorta and branch vessels is severely limited. No gross aneurysmal change or periaortic inflammation is seen. 3. Evaluation of the pulmonary arteries is nondiagnostic. 4. Hepatomegaly. 5. Mild cardiomegaly. 6. Aortic Atherosclerosis (ICD10-I70.0). These results and limitations of this examination were called by telephone at the time of interpretation on 06/12/2019 at 11:06 pm to [facility],  who verbally acknowledged these results. Electronically Signed   By: Lovena Le M.D.   On: 06/12/2019 23:08       Assessment & Plan:    Principal Problem:   Chest pain Active Problems:   Essential hypertension, benign   Hyperlipidemia   Type 2 diabetes mellitus with hyperglycemia  (HCC)  Chest pain  Tele Trop I  Check hga1c, lipid Check cardiac echo Start Aspirin 80m po qday Cont Losartan/ hydrochlorothiazide Start Carvedilol 3.129mpo bid Cont Crestor 2031mo qhs Please consult cardiology in [place] regarding chest pain  Hypertension uncontrolled Labetalol 7m51m x1 Start Carvedilol as above Cont Losartan/ hydrochlorothiazide as above  Dm2 Hold Metformin Hold Farxiga fsbs q4h, ISS  Gerd Cont PPI   DVT Prophylaxis-   Lovenox - SCDs   AM Labs Ordered, also please review Full Orders  Family Communication: Admission, patients condition and plan of care including tests being ordered have been discussed with the patient and  who indicate understanding and agree with the plan and Code Status.  Code Status:  FULL  CODE per patient, notified spouse of [facility] Observation: Based on patients clinical presentation and evaluation of above clinical data, I have made determination that patient meets Observation criteria at this time.  Time spent in minutes : 70 minutes   JameJani Gravel on 06/13/2019 at 12:23 AM

## 2019-06-13 NOTE — Discharge Summary (Signed)
Physician Discharge Summary  Christopher Skinner INO:[id number] DOB: 1971-03-08 DOA: 06/12/2019  PCP: Carlena Hurl, PA-C  Admit date: 06/12/2019 Discharge date: 06/14/2019  Admitted From: Home Discharge disposition: Home   Recommendations for Outpatient Follow-Up:   On imaging: Some flowing anterior osteophytosis may be reflective of diffuse a pathic skeletal hyperostosis (DISH)-- encouraged weight loss, consider outpatient PT referral Titrate BP meds as able Has follow-up with cardiology on the 17th of this month -Encouraged lifestyle modification -Needs further work-up for sleep apnea/compliance with CPAP at night   Discharge Diagnosis:   Principal Problem:   Chest pain Active Problems:   Essential hypertension, benign   Hyperlipidemia   Type 2 diabetes mellitus with hyperglycemia ([place])    Discharge Condition: Improved.  Diet recommendation: Low sodium, heart healthy.  Carbohydrate-modified.   Wound care: None.  Code status: Full.   History of Present Illness:   Christopher Skinner  is a 49 y.o. male, w hypertension, hyperlipidemia, Dm2, OSA, recent covid-19 infection 05/02/2019 w Chest pain from his lower back into his chest,  "sharp" "central"  Typically lasting a few minutes,  Started Friday.   Pt initially was at rest on Friday morning when the chest pain started.  Slight dry cough.  Pt denies fever, chills, palp, sob, n/v, diarrhea, brbpr, black stool, dysuria, hematuria.    Hospital Course by Problem:   Atypical Chest pain -patient describes as starting in his lumbar spine going underneath his groin and radiating up to his chest -Cardiac enzymes negative -CTA done due to elevated D-dimer but due to body habitus unclear, lower extremity duplexes done and were negative, doubt PE disease due to size unable to do VQ scan) -Echo:Left ventricular ejection fraction, by estimation, is 55 to 60%. The  left ventricle has normal function. The left ventricle has  no regional  wall motion abnormalities. Left ventricular diastolic parameters were  Normal. -On beta-blocker and statin  Hypertension uncontrolled Start Carvedilol but increase dose Cont Losartan/ hydrochlorothiazide as above -Outpatient titration  Dm2 -Resume home meds -Encourage weight loss  Gerd Cont PPI    Medical Consultants:    None  Discharge Exam:   Vitals:   06/13/19 0949 06/13/19 1204  BP: (!) 169/98 (!) 150/83  Pulse: 89 92  Resp:    Temp:  97.9 F (36.6 C)  SpO2:  100%   Vitals:   06/13/19 0350 06/13/19 0735 06/13/19 0949 06/13/19 1204  BP: (!) 155/90 (!) 176/100 (!) 169/98 (!) 150/83  Pulse: 89 89 89 92  Resp:  18    Temp:  98.1 F (36.7 C)  97.9 F (36.6 C)  TempSrc:  Oral  Oral  SpO2:  100%  100%  Weight:      Height:        General exam: Appears calm and comfortable.  Symptoms resolved and would like to go home  The results of significant diagnostics from this hospitalization (including imaging, microbiology, ancillary and laboratory) are listed below for reference.     Procedures and Diagnostic Studies:   DG Chest 2 View  Result Date: 06/12/2019 CLINICAL DATA:  Patient c/o chest pain SOB as well as back pain x's 2 days EXAM: CHEST - 2 VIEW COMPARISON:  Chest radiograph 06/09/2019 FINDINGS: Stable cardiomediastinal contours with normal heart size. The lungs are clear. No pneumothorax or pleural effusion. There is flowing osteophytosis throughout the thoracic spine. IMPRESSION: No acute cardiopulmonary disease. Electronically Signed   By: Audie Pinto M.D.   On:  06/12/2019 19:47   DG Lumbar Spine 2-3 Views  Result Date: 06/13/2019 CLINICAL DATA:  Low back pain. EXAM: LUMBAR SPINE - 2-3 VIEW COMPARISON:  02/09/12. FINDINGS: Markedly diminished exam detail secondary to body habitus. The alignment appears normal. No fracture identified. Mild multilevel endplate spurring identified within the lumbar spine. IMPRESSION: 1. Markedly  diminished exam detail as above.  No acute findings. 2. Mild degenerative disc disease. Electronically Signed   By: Kerby Moors M.D.   On: 06/13/2019 09:31   ECHOCARDIOGRAM COMPLETE  Result Date: 06/13/2019    ECHOCARDIOGRAM REPORT   Patient Name:   Christopher Skinner Date of Exam: 06/13/2019 Medical Rec #:  [id number]            Height:       69.0 in Accession #:    [id number]           Weight:       493.0 lb Date of Birth:  March 19, 1971             BSA:          3.03 m Patient Age:    36 years             BP:           176/100 mmHg Patient Gender: M                    HR:           90 bpm. Exam Location:  Inpatient Procedure: 2D Echo, Cardiac Doppler and Color Doppler Indications:    Chest Pain 786.50  History:        Patient has prior history of Echocardiogram examinations, most                 recent 05/01/2006. Abnormal ECG, Signs/Symptoms:Shortness of                 Breath and Chest Pain; Risk Factors:Hypertension, Diabetes,                 Dyslipidemia and Non-Smoker.  Sonographer:    Vickie Epley RDCS Referring Phys: [phone] Jani Gravel  Sonographer Comments: Image acquisition challenging due to patient body habitus. IMPRESSIONS  1. Left ventricular ejection fraction, by estimation, is 55 to 60%. The left ventricle has normal function. The left ventricle has no regional wall motion abnormalities. Left ventricular diastolic parameters were normal.  2. Right ventricular systolic function is normal. The right ventricular size is normal.  3. Left atrial size was mildly dilated.  4. The mitral valve is normal in structure and function. No evidence of mitral valve regurgitation. No evidence of mitral stenosis.  5. The aortic valve is tricuspid. Aortic valve regurgitation is not visualized. Mild aortic valve sclerosis is present, with no evidence of aortic valve stenosis.  6. The inferior vena cava is normal in size with greater than 50% respiratory variability, suggesting right atrial pressure of 3 mmHg. FINDINGS   Left Ventricle: Left ventricular ejection fraction, by estimation, is 55 to 60%. The left ventricle has normal function. The left ventricle has no regional wall motion abnormalities. The left ventricular internal cavity size was normal in size. There is  no left ventricular hypertrophy. Left ventricular diastolic parameters were normal. Right Ventricle: The right ventricular size is normal. No increase in right ventricular wall thickness. Right ventricular systolic function is normal. Left Atrium: Left atrial size was mildly dilated. Right Atrium: Right atrial size was normal in size. Pericardium:  There is no evidence of pericardial effusion. Mitral Valve: The mitral valve is normal in structure and function. There is mild thickening of the mitral valve leaflet(s). There is mild calcification of the mitral valve leaflet(s). Normal mobility of the mitral valve leaflets. Mild mitral annular calcification. No evidence of mitral valve regurgitation. No evidence of mitral valve stenosis. Tricuspid Valve: The tricuspid valve is normal in structure. Tricuspid valve regurgitation is trivial. No evidence of tricuspid stenosis. Aortic Valve: The aortic valve is tricuspid. Aortic valve regurgitation is not visualized. Mild aortic valve sclerosis is present, with no evidence of aortic valve stenosis. Pulmonic Valve: The pulmonic valve was normal in structure. Pulmonic valve regurgitation is not visualized. No evidence of pulmonic stenosis. Aorta: The aortic root is normal in size and structure. Venous: The inferior vena cava was not well visualized. The inferior vena cava is normal in size with greater than 50% respiratory variability, suggesting right atrial pressure of 3 mmHg. IAS/Shunts: The interatrial septum was not well visualized.  LEFT VENTRICLE PLAX 2D LVIDd:         5.10 cm      Diastology LVIDs:         3.90 cm      LV e' lateral:   11.50 cm/s LV PW:         0.90 cm      LV E/e' lateral: 7.4 LV IVS:        0.90 cm       LV e' medial:    6.64 cm/s LVOT diam:     2.30 cm      LV E/e' medial:  12.8 LV SV:         84.34 ml LV SV Index:   16.78 LVOT Area:     4.15 cm  LV Volumes (MOD) LV vol d, MOD A2C: 119.0 ml LV vol d, MOD A4C: 181.0 ml LV vol s, MOD A2C: 59.1 ml LV vol s, MOD A4C: 81.1 ml LV SV MOD A2C:     59.9 ml LV SV MOD A4C:     181.0 ml LV SV MOD BP:      84.1 ml RIGHT VENTRICLE RV S prime:     15.40 cm/s TAPSE (M-mode): 1.6 cm LEFT ATRIUM             Index       RIGHT ATRIUM           Index LA diam:        3.90 cm 1.29 cm/m  RA Area:     14.60 cm LA Vol (A2C):   39.5 ml 13.03 ml/m RA Volume:   29.10 ml  9.60 ml/m LA Vol (A4C):   36.2 ml 11.94 ml/m LA Biplane Vol: 41.4 ml 13.66 ml/m  AORTIC VALVE LVOT Vmax:   105.00 cm/s LVOT Vmean:  77.200 cm/s LVOT VTI:    0.203 m  AORTA Ao Root diam: 3.20 cm MITRAL VALVE MV Area (PHT): 6.83 cm     SHUNTS MV Decel Time: 111 msec     Systemic VTI:  0.20 m MV E velocity: 85.30 cm/s   Systemic Diam: 2.30 cm MV A velocity: 115.00 cm/s MV E/A ratio:  0.74 Jenkins Rouge MD Electronically signed by Jenkins Rouge MD Signature Date/Time: 06/13/2019/12:03:35 PM    Final    CT Angio Chest/Abd/Pel for Dissection W and/or Wo Contrast  Result Date: 06/12/2019 CLINICAL DATA:  Acute aortic syndrome suspected EXAM: CT ANGIOGRAPHY CHEST, ABDOMEN AND PELVIS TECHNIQUE: Multidetector CT  imaging through the chest, abdomen and pelvis was performed using the standard protocol during bolus administration of intravenous contrast. Multiplanar reconstructed images and MIPs were obtained and reviewed to evaluate the vascular anatomy. CONTRAST:  148m OMNIPAQUE IOHEXOL 350 MG/ML SOLN COMPARISON:  Chest radiograph 06/12/2019 FINDINGS: Imaging quality is severely degraded due to patient body habitus. Angiographic images are centrally nondiagnostic for evaluation of the aorta. CTA CHEST FINDINGS Cardiovascular: Suboptimal visualization of the aorta both on noncontrast and postcontrast imaging due to extensive  photon starvation secondary to body habitus. Images are essentially nondiagnostic for evaluation of luminal abnormality. No periaortic stranding, hemorrhage or gas is seen. Normal caliber thoracic aorta. Shared origin of the brachiocephalic and left common carotid arteries. Cardiac size at the upper limits of normal. Central pulmonary arteries are normal caliber with luminal evaluation precluded by degraded imaging quality. Mediastinum/Nodes: No mediastinal fluid or gas. Normal thyroid gland and thoracic inlet. No acute abnormality of the trachea or esophagus. No worrisome mediastinal, hilar or axillary adenopathy. Lungs/Pleura: No consolidation, features of edema, pneumothorax, or effusion. No suspicious pulmonary nodules or masses. Musculoskeletal: Multilevel degenerative changes are present in the imaged portions of the spine. Some flowing anterior osteophytosis may be reflective of diffuse a pathic skeletal hyperostosis (DISH). Review of the MIP images confirms the above findings. CTA ABDOMEN AND PELVIS FINDINGS VASCULAR Evaluation of the abdominopelvic vasculature is severely degraded due to patient body habitus and resulting photon starvation artifact. Aorta: No gross aneurysmal dilatation. Luminal evaluation is precluded. Celiac: Poorly visualized.  No gross aneurysmal dilation. SMA: Poorly visualized.  Normal course and caliber of the vessel. Renals: Poorly visualized IMA: Poorly visualized Inflow: Poorly visualized. Appears opacified normally without aneurysm or ectasia. Luminal evaluation precluded. Veins: Major venous structures are unremarkable. Review of the MIP images confirms the above findings. NON-VASCULAR Hepatobiliary: No gross hepatic abnormality. Hepatomegaly is noted with liver measuring nearly 30 cm craniocaudal. Gallbladder i and biliary tree are poorly visualized. Pancreas: Grossly normal appearance of the pancreas Spleen: Normal splenic size.  No gross splenic abnormality. Adrenals/Urinary  Tract: Normal adrenal glands. Kidneys are normal and symmetric. No visible or contour deforming renal lesions. No evident hydronephrosis. Urinary bladder is grossly unremarkable. Stomach/Bowel: Small hiatal hernia. Stomach and duodenal sweep are unremarkable. No evidence of small-bowel obstruction. No frank dilatation or wall thickening. Appendix is poorly visualized. No colonic dilatation or wall thickening. Scattered colonic diverticula without focal pericolonic inflammation to suggest diverticulitis. No colonic thickening or dilatation. Lymphatic: No suspicious or enlarged lymph nodes in the included lymphatic chains. Reproductive: Dense The prostate and seminal vesicles are unremarkable. Other: No free fluid. No free air. No bowel containing hernias. Extensive body wall edema. Incomplete inclusion artifact as well as photon starvation significantly degrade imaging quality. Musculoskeletal: Multilevel degenerative changes are present in the imaged portions of the spine. No acute osseous abnormality or suspicious osseous lesion. Review of the MIP images confirms the above findings. IMPRESSION: 1. Patient's body habitus results in significant photon starvation and incomplete inclusion artifact with multidirectional beam hardening throughout the chest, abdomen and pelvis. This significantly degrades imaging quality in limits diagnostic utility of this angiographic study. 2. Luminal evaluation of the aorta and branch vessels is severely limited. No gross aneurysmal change or periaortic inflammation is seen. 3. Evaluation of the pulmonary arteries is nondiagnostic. 4. Hepatomegaly. 5. Mild cardiomegaly. 6. Aortic Atherosclerosis (ICD10-I70.0). These results and limitations of this examination were called by telephone at the time of interpretation on 06/12/2019 at 11:06 pm to [facility],  who verbally acknowledged these results. Electronically Signed   By: Lovena Le M.D.   On: 06/12/2019 23:08      Labs:   Basic Metabolic Panel: Recent Labs  Lab 06/10/19 1402 06/10/19 1402 06/12/19 1922 06/13/19 0547  NA 139  --  136 137  K 4.1   < > 4.2 3.8  CL 100  --  100 102  CO2 24  --  23 23  GLUCOSE 129*  --  279* 159*  BUN 12  --  14 13  CREATININE 1.50*  --  1.46* 1.24  CALCIUM 9.7  --  9.2 9.3   < > = values in this interval not displayed.   GFR Estimated Creatinine Clearance: 133 mL/min (by C-G formula based on SCr of 1.24 mg/dL). Liver Function Tests: Recent Labs  Lab 06/13/19 0547  AST 26  ALT 31  ALKPHOS 65  BILITOT 0.5  PROT 7.9  ALBUMIN 3.2*   No results for input(s): LIPASE, AMYLASE in the last 168 hours. No results for input(s): AMMONIA in the last 168 hours. Coagulation profile No results for input(s): INR, PROTIME in the last 168 hours.  CBC: Recent Labs  Lab 06/10/19 1402 06/12/19 1922 06/13/19 0547  WBC 7.9 9.2 10.1  NEUTROABS 5.3  --   --   HGB 11.5* 11.9* 11.5*  HCT 35.1* 37.7* 35.9*  MCV 87 89.8 90.2  PLT 389 435* 407*   Cardiac Enzymes: No results for input(s): CKTOTAL, CKMB, CKMBINDEX, TROPONINI in the last 168 hours. BNP: Invalid input(s): POCBNP CBG: Recent Labs  Lab 06/12/19 2353 06/13/19 0305 06/13/19 0623 06/13/19 1201  GLUCAP 173* 151* 134* 200*   D-Dimer Recent Labs    06/12/19 1922  DDIMER 1.54*   Hgb A1c Recent Labs    06/13/19 0547  HGBA1C 8.0*   Lipid Profile Recent Labs    06/13/19 0547  CHOL 203*  HDL 39*  LDLCALC 146*  TRIG 90  CHOLHDL 5.2   Thyroid function studies No results for input(s): TSH, T4TOTAL, T3FREE, THYROIDAB in the last 72 hours.  Invalid input(s): FREET3 Anemia work up No results for input(s): VITAMINB12, FOLATE, FERRITIN, TIBC, IRON, RETICCTPCT in the last 72 hours. Microbiology Recent Results (from the past 240 hour(s))  Respiratory Panel by RT PCR (Flu A&B, Covid) - Nasopharyngeal Swab     Status: None   Collection Time: 06/13/19 12:02 AM   Specimen: Nasopharyngeal  Swab  Result Value Ref Range Status   SARS Coronavirus 2 by RT PCR NEGATIVE NEGATIVE Final    Comment: (NOTE) SARS-CoV-2 target nucleic acids are NOT DETECTED. The SARS-CoV-2 RNA is generally detectable in upper respiratoy specimens during the acute phase of infection. The lowest concentration of SARS-CoV-2 viral copies this assay can detect is 131 copies/mL. A negative result does not preclude SARS-Cov-2 infection and should not be used as the sole basis for treatment or other patient management decisions. A negative result may occur with  improper specimen collection/handling, submission of specimen other than nasopharyngeal swab, presence of viral mutation(s) within the areas targeted by this assay, and inadequate number of viral copies (<131 copies/mL). A negative result must be combined with clinical observations, patient history, and epidemiological information. The expected result is Negative. Fact Sheet for Patients:  PinkCheek.be Fact Sheet for Healthcare Providers:  GravelBags.it This test is not yet ap proved or cleared by the [place] FDA and  has been authorized for detection and/or diagnosis of SARS-CoV-2 by FDA under an Emergency Use Authorization (EUA).  This EUA will remain  in effect (meaning this test can be used) for the duration of the COVID-19 declaration under Section 564(b)(1) of the Act, 21 U.S.C. section 360bbb-3(b)(1), unless the authorization is terminated or revoked sooner.    Influenza A by PCR NEGATIVE NEGATIVE Final   Influenza B by PCR NEGATIVE NEGATIVE Final    Comment: (NOTE) The Xpert Xpress SARS-CoV-2/FLU/RSV assay is intended as an aid in  the diagnosis of influenza from Nasopharyngeal swab specimens and  should not be used as a sole basis for treatment. Nasal washings and  aspirates are unacceptable for Xpert Xpress SARS-CoV-2/FLU/RSV  testing. Fact Sheet for  Patients: PinkCheek.be Fact Sheet for Healthcare Providers: GravelBags.it This test is not yet approved or cleared by the [place] FDA and  has been authorized for detection and/or diagnosis of SARS-CoV-2 by  FDA under an Emergency Use Authorization (EUA). This EUA will remain  in effect (meaning this test can be used) for the duration of the  Covid-19 declaration under Section 564(b)(1) of the Act, 21  U.S.C. section 360bbb-3(b)(1), unless the authorization is  terminated or revoked. Performed at [facility], [place] [address], [place], [place] [address]      Discharge Instructions:   Discharge Instructions    Diet - low sodium heart healthy   Complete by: As directed    Diet Carb Modified   Complete by: As directed    Discharge instructions   Complete by: As directed    Echo shows your heart pumps well No blood clots seen Needs to work on lifestyle modifications: 1. Weight loss 2. BP control 3. Blood sugar control You have an appointment with cardiology coming up in a few days, be sure to go   Increase activity slowly   Complete by: As directed      Allergies as of 06/13/2019   No Known Allergies     Medication List    STOP taking these medications   azithromycin 250 MG tablet Commonly known as: ZITHROMAX   predniSONE 20 MG tablet Commonly known as: DELTASONE     TAKE these medications   albuterol 108 (90 Base) MCG/ACT inhaler Commonly known as: VENTOLIN HFA Inhale 2 puffs into the lungs every 4 (four) hours as needed for wheezing or shortness of breath. What changed: Another medication with the same name was removed. Continue taking this medication, and follow the directions you see here.   blood glucose meter kit and supplies Kit Dispense based on patient and insurance preference. Use 1-2 times daily as directed. (FOR ICD-10 DE11.69).   carvedilol 6.25 MG tablet Commonly known as:  COREG Take 1 tablet (6.25 mg total) by mouth 2 (two) times daily with a meal. What changed:   medication strength  how much to take   Farxiga 5 MG Tabs tablet Generic drug: dapagliflozin propanediol Take 5 mg by mouth daily.   glucose blood test strip True metrix strips to test 1-2 times daily   Lancets Misc Use as instructed. Needs lancets based on pt's meter (true metrix)   losartan-hydrochlorothiazide 100-12.5 MG tablet Commonly known as: HYZAAR Take 1 tablet by mouth daily.   metFORMIN 850 MG tablet Commonly known as: GLUCOPHAGE Take 1 tablet (850 mg total) by mouth 2 (two) times daily with a meal. Start taking on: June 15, 2019   RABEprazole 20 MG tablet Commonly known as: ACIPHEX Take 1 tablet (20 mg total) by mouth daily.   rosuvastatin 20 MG tablet Commonly known as: Crestor  Take 1 tablet (20 mg total) by mouth at bedtime.   True Metrix Meter Devi 1 Device by Does not apply route daily.     ASK your doctor about these medications   HYDROcodone-homatropine 5-1.5 MG/5ML syrup Commonly known as: HYCODAN Take 5 mLs by mouth every 8 (eight) hours as needed for up to 5 days. Ask about: Should I take this medication?      Follow-up Information    Tysinger, [name], PA-C Follow up in 1 week(s).   Specialty: Family Medicine Contact information: [address] [place] [address] [phone]            Time coordinating discharge: 25 minutes  Signed:  Geradine Girt DO  [facility] 06/14/2019, 1:11 PM

## 2019-06-13 NOTE — Progress Notes (Signed)
  Echocardiogram 2D Echocardiogram has been performed.  Christopher Skinner 06/13/2019, 11:59 AM

## 2019-06-13 NOTE — Progress Notes (Signed)
VASCULAR LAB PRELIMINARY  PRELIMINARY  PRELIMINARY  PRELIMINARY  Bilateral lower extremity venous duplex completed.    Preliminary report:  See CV proc for preliminary results.   Gatlin Kittell, RVT 06/13/2019, 3:04 PM

## 2019-06-15 NOTE — Progress Notes (Signed)
Patient referred by Carlena Hurl, PA-C for screening for heart disease  Subjective:   Christopher Skinner, male    DOB: 12-Mar-1971, 49 y.o.   MRN: [id number]   Chief Complaint  Patient presents with  . Hypertension  . Obesity  . Abnormal ECG  . New Patient (Initial Visit)    HPI  49 y.o. African American male with morbid obesity (BMI 72), hypertension, type 2 diabetes mellitus, referred for screening for heart disease.   Patient owns a [employer]. His physical activity is limited to his work. He is trying to walks more. He denies chest pain, palpitations, leg edema, orthopnea, PND, TIA/syncope. He reports shortness of breath with walking, but this is not new for him. He reports having gained weight in last 20 years. While he reports that he is trying to eat more vegetables, he admits drinking sugary drinks like fruit juices.   Past Medical History:  Diagnosis Date  . COVID-19 05/02/2019  . Diabetes mellitus without complication ([place]) 4627  . GERD (gastroesophageal reflux disease)   . H/O exercise stress test 2009   reportedly normal per patient  . Hyperlipidemia   . Hypertension 2008  . Microalbuminuria 2019  . Obesity   . Sleep apnea    last used CPAP 2014; quit on his own     Past Surgical History:  Procedure Laterality Date  . CARPAL TUNNEL RELEASE     left  . COLONOSCOPY  2010   normal per patient  . HAND SURGERY     growth resection, right     Social History   Tobacco Use  Smoking Status Never Smoker  Smokeless Tobacco Never Used    Social History   Substance and Sexual Activity  Alcohol Use No     Family History  Problem Relation Age of Onset  . Hyperlipidemia Other   . Hypertension Other   . Sleep apnea Other   . Obesity Other   . Stroke Mother   . Hypertension Mother   . Hypertension Father   . Hypertension Sister   . Hypertension Brother   . Hypertension Sister   . Hypertension Brother   . Hypertension Sister   .  Hypertension Sister   . Hypertension Sister   . Hypertension Sister   . Hypertension Sister   . Hypertension Brother   . Hypertension Brother   . Hypertension Brother   . Hypertension Brother   . Hypertension Brother   . Hypertension Brother   . Hypertension Brother   . Hypertension Brother   . Hypertension Brother   . Hypertension Brother   . Hypertension Brother   . Hypertension Brother   . Hypertension Brother   . Hypertension Brother   . Hypertension Brother   . Hypertension Sister   . Hypertension Sister   . Heart disease Neg Hx   . Cancer Neg Hx   . Diabetes Neg Hx      Current Outpatient Medications on File Prior to Visit  Medication Sig Dispense Refill  . albuterol (PROVENTIL HFA;VENTOLIN HFA) 108 (90 Base) MCG/ACT inhaler Inhale 2 puffs into the lungs every 4 (four) hours as needed for wheezing or shortness of breath. 1 Inhaler 0  . blood glucose meter kit and supplies KIT Dispense based on patient and insurance preference. Use 1-2 times daily as directed. (FOR ICD-10 DE11.69). 1 each 0  . Blood Glucose Monitoring Suppl (TRUE METRIX METER) DEVI 1 Device by Does not apply route daily. 1 each 0  .  carvedilol (COREG) 6.25 MG tablet Take 1 tablet (6.25 mg total) by mouth 2 (two) times daily with a meal. 60 tablet 0  . dapagliflozin propanediol (FARXIGA) 5 MG TABS tablet Take 5 mg by mouth daily. 30 tablet 3  . glucose blood test strip True metrix strips to test 1-2 times daily 100 each 12  . Lancets MISC Use as instructed. Needs lancets based on pt's meter (true metrix) 100 each 12  . losartan-hydrochlorothiazide (HYZAAR) 100-12.5 MG tablet Take 1 tablet by mouth daily. 90 tablet 3  . metFORMIN (GLUCOPHAGE) 850 MG tablet Take 1 tablet (850 mg total) by mouth 2 (two) times daily with a meal. 60 tablet 3  . RABEprazole (ACIPHEX) 20 MG tablet Take 1 tablet (20 mg total) by mouth daily. 90 tablet 1  . rosuvastatin (CRESTOR) 20 MG tablet Take 1 tablet (20 mg total) by mouth at  bedtime. 90 tablet 3   No current facility-administered medications on file prior to visit.    Cardiovascular and other pertinent studies:  EKG 06/16/2019: Sinus rhythm 93 bpm.  Low voltage in precordial leads.  Nonspecific T-abnormality.     Recent labs: 06/13/2019: Glucose 159, BUN/Cr 13/1.24. EGFR >60. Na/K 137/3.8.  Albumin: 3.2 H/H 11.5/35.9. MCV 90.2. Platelets 407 HbA1C 8.0 % Chol 203, TG 90, HDL 39, LDL 146   Review of Systems  Constitution: Positive for weight gain.  Cardiovascular: Positive for dyspnea on exertion. Negative for chest pain, leg swelling, palpitations and syncope.         Vitals:   06/16/19 1000 06/16/19 1011  BP: (!) 182/96 (!) 177/115  Pulse: (!) 103 92  Resp: 16   Temp: (!) 93 F (33.9 C)   SpO2: 98%      Body mass index is 72.73 kg/m. Filed Weights   06/16/19 1000  Weight: (!) 492 lb 8 oz (223.4 kg)     Objective:   Physical Exam  Constitutional: He appears well-developed and well-nourished.  Morbid obesity  Neck: No JVD present.  Cardiovascular: Normal rate, regular rhythm, normal heart sounds and intact distal pulses.  No murmur heard. Pulmonary/Chest: Effort normal and breath sounds normal. He has no wheezes. He has no rales.  Musculoskeletal:        General: No edema.  Nursing note and vitals reviewed.       Assessment & Recommendations:   49 y.o. African American male with morbid obesity (BMI 72), hypertension, type 2 diabetes mellitus, referred for screening for heart disease.   Risk factors for CAD include hypertension, type 2 DM. However, his most important medical issue clearly is morbid obesity. I had a long discussion with the patient regarding calorie negative diet, regular walking, and considering surgical weight loss. I explained to the patient that surgical weight loss would not be sustainable, unless he made significant changes to his diet and lifestyle. He doe snot have any symptoms s/o CAD or heart  failure at this time. I do not recommend any imaging study, as they would be fraught with the risk of artifact. Increased carvedilol to 12.5 mg bid and Crestor to 40 mg daily. Continue follow up with PCP.  I will see him on as needed basis.    Thank you for referring the patient to [place]. Please feel free to contact with any questions.  Nigel Mormon, MD [facility]. [facility] Pager: [phone] Office: [phone]

## 2019-06-16 ENCOUNTER — Encounter: Payer: Self-pay | Admitting: Cardiology

## 2019-06-16 ENCOUNTER — Other Ambulatory Visit: Payer: Self-pay

## 2019-06-16 ENCOUNTER — Ambulatory Visit (INDEPENDENT_AMBULATORY_CARE_PROVIDER_SITE_OTHER): Payer: 59 | Admitting: Cardiology

## 2019-06-16 VITALS — BP 177/115 | HR 92 | Temp 93.0°F | Resp 16 | Ht 69.0 in | Wt >= 6400 oz

## 2019-06-16 DIAGNOSIS — Z136 Encounter for screening for cardiovascular disorders: Secondary | ICD-10-CM | POA: Diagnosis not present

## 2019-06-16 DIAGNOSIS — I10 Essential (primary) hypertension: Secondary | ICD-10-CM | POA: Diagnosis not present

## 2019-06-16 DIAGNOSIS — G4733 Obstructive sleep apnea (adult) (pediatric): Secondary | ICD-10-CM | POA: Diagnosis not present

## 2019-06-16 DIAGNOSIS — E1165 Type 2 diabetes mellitus with hyperglycemia: Secondary | ICD-10-CM

## 2019-06-16 DIAGNOSIS — E785 Hyperlipidemia, unspecified: Secondary | ICD-10-CM

## 2019-06-16 MED ORDER — ROSUVASTATIN CALCIUM 40 MG PO TABS: 40.0000 mg | ORAL_TABLET | Freq: Every day | ORAL | 1 refills | Status: DC

## 2019-06-16 MED ORDER — CARVEDILOL 12.5 MG PO TABS: 12.5000 mg | ORAL_TABLET | Freq: Two times a day (BID) | ORAL | 1 refills | Status: DC

## 2019-06-18 ENCOUNTER — Telehealth: Payer: Self-pay

## 2019-06-18 NOTE — Telephone Encounter (Signed)
Have him try taking metformin 1/2 tablet 3 times daily until his follow up to see if this improves the symptoms

## 2019-06-18 NOTE — Telephone Encounter (Signed)
Patient called and stated he is having diarrhea since his meds has been increased. Possibly caused by the metformin. Patient also stated he is having back pain near is kidney. Please advise as patient has an appointment next Wednesday.

## 2019-06-21 NOTE — Telephone Encounter (Signed)
Patient stated his body has to get adjusted to the medication. He's doing ine on the prescribed dose.

## 2019-06-23 ENCOUNTER — Ambulatory Visit (INDEPENDENT_AMBULATORY_CARE_PROVIDER_SITE_OTHER): Payer: 59 | Admitting: Medical

## 2019-06-23 ENCOUNTER — Encounter: Payer: Self-pay | Admitting: Medical

## 2019-06-23 ENCOUNTER — Other Ambulatory Visit: Payer: Self-pay

## 2019-06-23 VITALS — BP 148/86 | HR 99 | Temp 98.0°F | Ht 69.0 in | Wt >= 6400 oz

## 2019-06-23 DIAGNOSIS — R05 Cough: Secondary | ICD-10-CM | POA: Diagnosis not present

## 2019-06-23 DIAGNOSIS — R0602 Shortness of breath: Secondary | ICD-10-CM

## 2019-06-23 DIAGNOSIS — R160 Hepatomegaly, not elsewhere classified: Secondary | ICD-10-CM | POA: Insufficient documentation

## 2019-06-23 DIAGNOSIS — R059 Cough, unspecified: Secondary | ICD-10-CM

## 2019-06-23 DIAGNOSIS — E785 Hyperlipidemia, unspecified: Secondary | ICD-10-CM

## 2019-06-23 DIAGNOSIS — E1165 Type 2 diabetes mellitus with hyperglycemia: Secondary | ICD-10-CM | POA: Diagnosis not present

## 2019-06-23 DIAGNOSIS — I10 Essential (primary) hypertension: Secondary | ICD-10-CM

## 2019-06-23 DIAGNOSIS — I700 Atherosclerosis of aorta: Secondary | ICD-10-CM

## 2019-06-23 DIAGNOSIS — D649 Anemia, unspecified: Secondary | ICD-10-CM | POA: Insufficient documentation

## 2019-06-23 DIAGNOSIS — G4733 Obstructive sleep apnea (adult) (pediatric): Secondary | ICD-10-CM

## 2019-06-23 MED ORDER — VITAMIN B-12 1000 MCG PO TABS: 1000.0000 ug | ORAL_TABLET | Freq: Every day | ORAL | 3 refills | Status: DC

## 2019-06-23 MED ORDER — DAPAGLIFLOZIN PROPANEDIOL 10 MG PO TABS: 10.0000 mg | ORAL_TABLET | Freq: Every day | ORAL | 1 refills | Status: DC

## 2019-06-23 NOTE — Progress Notes (Signed)
Subjective: Chief Complaint  Patient presents with  . Follow-up   Here for follow-up.  I saw him on 06/10/2019 for ongoing cough and shortness of breath lasting for several weeks.  After last visit we had check labs and chest x-ray, but he came back positive for D-dimer.  Ended up going to the emergency department 06/12/2019 for evaluation.  He was admitted 06/12/2019 through 06/14/2019.  He was worked up for chest pain.  Cardiac enzymes were negative, CTA done but due to body habitus was difficult to get a clear evaluation, he did have hepatomegaly, cardiomegaly mild and aortic atherosclerosis on CT.  Echocardiogram showed EF 55 to 60%, normal LV function.  In the hospital he was started on carvedilol, he was continued on losartan HCT.  He was resumed on the same diabetes medicines Farxiga 5 mg and Metformin 850 mg twice daily  Ended up seeing cardiology outpatient on 06/16/2019 with Dr. Virgina Jock.  They titrated his carvedilol but felt he needed no other cardiac evaluation at this time  Overall today he feels much improved, not feeling shortness of breath now.  Not much cough.  Symptoms have finally calmed down  When we went up on Metformin recently had a little diarrhea but that has calmed down.  He still cannot seem to get a glucometer from [place] although we called last time to verify that he has a prescription  No other new complaint  Past Medical History:  Diagnosis Date  . COVID-19 05/02/2019  . Diabetes mellitus without complication ([place]) 0017  . GERD (gastroesophageal reflux disease)   . H/O exercise stress test 2009   reportedly normal per patient  . Hyperlipidemia   . Hypertension 2008  . Microalbuminuria 2019  . Obesity   . Sleep apnea    last used CPAP 2014; quit on his own   Current Outpatient Medications on File Prior to Visit  Medication Sig Dispense Refill  . albuterol (PROVENTIL HFA;VENTOLIN HFA) 108 (90 Base) MCG/ACT inhaler Inhale 2 puffs into the lungs every 4  (four) hours as needed for wheezing or shortness of breath. 1 Inhaler 0  . blood glucose meter kit and supplies KIT Dispense based on patient and insurance preference. Use 1-2 times daily as directed. (FOR ICD-10 DE11.69). 1 each 0  . Blood Glucose Monitoring Suppl (TRUE METRIX METER) DEVI 1 Device by Does not apply route daily. 1 each 0  . carvedilol (COREG) 12.5 MG tablet Take 1 tablet (12.5 mg total) by mouth 2 (two) times daily with a meal. 60 tablet 1  . glucose blood test strip True metrix strips to test 1-2 times daily 100 each 12  . Lancets MISC Use as instructed. Needs lancets based on pt's meter (true metrix) 100 each 12  . losartan-hydrochlorothiazide (HYZAAR) 100-12.5 MG tablet Take 1 tablet by mouth daily. 90 tablet 3  . metFORMIN (GLUCOPHAGE) 850 MG tablet Take 1 tablet (850 mg total) by mouth 2 (two) times daily with a meal. 60 tablet 3  . RABEprazole (ACIPHEX) 20 MG tablet Take 1 tablet (20 mg total) by mouth daily. 90 tablet 1  . rosuvastatin (CRESTOR) 40 MG tablet Take 1 tablet (40 mg total) by mouth at bedtime. 30 tablet 1   No current facility-administered medications on file prior to visit.   Review of systems as in subjective   Objective: BP (!) 148/86   Pulse 99   Temp 98 F (36.7 C)   Ht '5\' 9"'$  (1.753 m)   Wt (!) 493 lb (  223.6 kg)   SpO2 96%   BMI 72.80 kg/m    BP Readings from Last 3 Encounters:  06/23/19 (!) 148/86  06/16/19 (!) 177/115  06/13/19 (!) 150/83   Wt Readings from Last 3 Encounters:  06/23/19 (!) 493 lb (223.6 kg)  06/16/19 (!) 492 lb 8 oz (223.4 kg)  06/13/19 (!) 493 lb (223.6 kg)   Gen: wd, wn, nad Lungs clear, no wheezing, no rhonchi, no rales Heart rrr, normal s1, s2, no murmurs Legs no edema, pulses 1+ bilat   Assessment: Encounter Diagnoses  Name Primary?  . SOB (shortness of breath) Yes  . Cough   . Type 2 diabetes mellitus with hyperglycemia, unspecified whether long term insulin use ([place])   . Essential hypertension,  benign   . OSA (obstructive sleep apnea)   . Morbid obesity ([place])   . Hyperlipidemia, unspecified hyperlipidemia type   . Hepatomegaly   . Aortic atherosclerosis ([place])   . Anemia, unspecified type     Plan: Diabetes-I again wrote a prescription for glucometer testing supplies.  Advised to go to a different pharmacy since he is having so much problems.  Start checking blood sugars.  Goal less than 130 fasting.  We discussed potential side effects of medications.  If he continues having problems with Metformin we can back down the dose to 500 twice daily.  But for now continue to 850 twice daily.  I increased [place] today.  We discussed the need for good water intake.  Follow-up 3 months  Obesity-referral to bariatric surgery clinic to start the process and to get him hooked in with nutritionist and counseling.   Spent some time counseling on low-carb diet, exercise hydration  Sleep apnea-he notes prior sleep study greater than 5 years ago positive for sleep apnea.  He was not able to tolerate CPAP device.  I gave him the option of trying this again and going for updated sleep study.  He really would not give me an answer today.  He will consider  His shortness of breath and cough have pretty much resolved.  I reviewed the hospital discharge summary, labs, scans, recommendations.  He was started on carvedilol.  Medicines reconciled today.  I reviewed his cardiology notes from recent consult.  They increase the carvedilol but decided not to do any other cardiac testing at this time  Hyperlipidemia-continue statin  We discussed the chest CT findings from the hospital visit showing hepatomegaly, aortic atherosclerosis, and mild cardiomegaly  Continue current medications.  We will make a referral to bariatric clinic to start this process.  Counseled on low-carb diet, exercise, and being diligent with trying to lose weight.  Stylianos was seen today for follow-up.  Diagnoses and all orders for  this visit:  SOB (shortness of breath)  Cough  Type 2 diabetes mellitus with hyperglycemia, unspecified whether long term insulin use ([facility])  Essential hypertension, benign  OSA (obstructive sleep apnea)  Morbid obesity ([facility]) -     Amb Referral to Bariatric Surgery  Hyperlipidemia, unspecified hyperlipidemia type  Hepatomegaly  Aortic atherosclerosis ([facility])  Anemia, unspecified type  Other orders -     dapagliflozin propanediol (FARXIGA) 10 MG TABS tablet; Take 10 mg by mouth daily before breakfast. -     vitamin B-12 (CYANOCOBALAMIN) 1000 MCG tablet; Take 1 tablet (1,000 mcg total) by mouth daily.

## 2019-06-23 NOTE — Patient Instructions (Signed)
Exercise: I recommend exercising most days of the week using a type of exercise that you would enjoy and stick to such as walking, running, swimming, hiking, biking, aerobics, etc. This needs to be at least 30-40 minutes at a time, at least 5 days/week with moderate intensity.  This would be 60-70% of your maximum heart rate.  For example, your maximal heart rate would be 200- your age, multiplied x 0.6 to equal 60% of your maximal heart rate.    Thus, a person age 57 would have a maximum heart rate of 160.  60% of this would be 96 beats per minute.  Thus for fat burning exercise, a 50 year old would want to exercise has sustained heart rate of about 96 bpm for fat burning.  However for heart health, they would want to exercise at a higher heart rate of about 75% of maximum heart rate which would be a heart rate of about 120 beats per minute.  So I recommend a combination of doing some exercise at moderate intensity, and some exercise at vigorous intensity for heart health.   Low Carb Diet recommendations:  I recommend you drink water throughout the day.   70 ounces or 2 liters would be a good amount.  If you have been accustomed to drinking juice or soda, try water with lemon or water with lime, or try using no calorie flavor dropper.  I recommend the following as an example meal plan that includes 3 meals per day.   You can skip some meals periodically for intermittent fasting.  Breakfast (choose one): Marland Kitchen Omelette with egg, can include a little bit of cheese, your choice of mushrooms, peppers, onions, salsa . Smoothie with handful of spinach or kale, 1 cup of milk or water, 1 cup of berries, 1 packet of artificial sweetener such as stevia . Yogurt with fruit   Mid morning snack: . 1 fruit serving and 1 protein such as 8 almonds or 8 nuts, or vegetable such as carrots and humus or other similar vegetable   Lunch: . Salad with 3-4 ounces of lean grilled or baked meat such as fish, chicken or  [place]   Mid afternoon snack: . 1 fruit serving and 1 protein such as 8 almonds or 8 nuts, or vegetable such as carrots and humus or other similar vegetable   Dinner: . Large serving of vegetables and 3-4 ounces of lean grilled or baked meat such as fish, chicken or [place] . Or vegetarian dish without meat    Avoid  . Chips, cookies, cake, donuts, soda, sweet tea, juices, candy, fast food . For now , avoid, or significantly limit grains

## 2019-06-29 ENCOUNTER — Ambulatory Visit: Payer: 59 | Admitting: Family Medicine

## 2019-06-30 ENCOUNTER — Ambulatory Visit: Payer: 59 | Admitting: Family Medicine

## 2019-07-01 ENCOUNTER — Ambulatory Visit: Payer: Self-pay

## 2019-07-01 ENCOUNTER — Ambulatory Visit (INDEPENDENT_AMBULATORY_CARE_PROVIDER_SITE_OTHER): Payer: 59 | Admitting: Family Medicine

## 2019-07-01 ENCOUNTER — Telehealth: Payer: Self-pay | Admitting: Family Medicine

## 2019-07-01 ENCOUNTER — Telehealth: Payer: Self-pay

## 2019-07-01 ENCOUNTER — Other Ambulatory Visit: Payer: Self-pay | Admitting: Medical

## 2019-07-01 ENCOUNTER — Other Ambulatory Visit: Payer: Self-pay

## 2019-07-01 ENCOUNTER — Other Ambulatory Visit: Payer: Self-pay | Admitting: Family Medicine

## 2019-07-01 ENCOUNTER — Encounter: Payer: Self-pay | Admitting: Family Medicine

## 2019-07-01 ENCOUNTER — Ambulatory Visit (INDEPENDENT_AMBULATORY_CARE_PROVIDER_SITE_OTHER): Payer: 59

## 2019-07-01 VITALS — BP 130/90 | HR 93 | Ht 69.0 in | Wt >= 6400 oz

## 2019-07-01 DIAGNOSIS — M25561 Pain in right knee: Secondary | ICD-10-CM

## 2019-07-01 MED ORDER — HYDROCODONE-ACETAMINOPHEN 5-325 MG PO TABS: 1.0000 | ORAL_TABLET | Freq: Four times a day (QID) | ORAL | 0 refills | Status: DC | PRN

## 2019-07-01 MED ORDER — JARDIANCE 10 MG PO TABS: 10.0000 mg | ORAL_TABLET | Freq: Every day | ORAL | 1 refills | Status: DC

## 2019-07-01 NOTE — Telephone Encounter (Signed)
Pt. Aware of change in medication and will pick it up at the pharmacy.

## 2019-07-01 NOTE — Progress Notes (Signed)
Subjective:    CC: R knee pain  I, Molly Weber, LAT, ATC, am serving as scribe for Dr. Lynne Leader.  HPI: Pt is 49 y/o male presenting w/ c/o R knee pain x1-2 months. He rates his knee pain at a 8-10/10 and describes his pain as sharp and stabbing. Patient states that the pain used to be on and off but for about 2 weeks has been constant and keeps him up at night.  He does not recall a specific injury but notes the pain started after he did stand up from a seated position.  Radiating pain: no R knee swelling: has notices some puffy spots R knee mechanical symptoms: Aggravating factors: sitting to standing, laying down, walking Treatments tried: Tylenol and OTC topical ointments   Diagnostic imaging: R knee XR- 02/09/12  Pertinent review of Systems: No fevers or chills.  Relevant historical information: Hypertension, morbid obesity, diabetes   Objective:    Vitals:   07/01/19 1038  BP: 130/90  Pulse: 93  SpO2: 97%   General: Morbidly obese, well nourished, and in no acute distress.   MSK: Right knee: Large habitus.  Moderate effusion. No deformity. Range of motion 5-100 degrees. Tender palpation medial joint line. No laxity but pain with MCL stress test. No pain or laxity with LCL stress test. Some guarding but no obvious laxity with anterior posterior drawer test. Guarding with strength testing however extension and flexion strength is intact. Antalgic gait.   Lab and Radiology Results  X-ray images right knee obtained today personally and independently reviewed. Moderate degenerative changes medial and lateral joint spaces. No acute fractures. Await formal radiology review.  Diagnostic Limited MSK Ultrasound of: Right knee Quad tendon intact moderate effusion.  Patellar tendon intact normal-appearing. Medial joint line narrowed medial meniscus.  MCL appears to be intact. Lateral joint line slightly narrowed intact LCL. Impression: Joint effusion MCL  strain probable medial meniscus tear  Procedure: Real-time Ultrasound Guided Injection of right knee Device: Philips Affiniti 50G Images permanently stored and available for review in the ultrasound unit. Verbal informed consent obtained.  Discussed risks and benefits of procedure. Warned about infection bleeding damage to structures skin hypopigmentation and fat atrophy among others. Patient expresses understanding and agreement Time-out conducted.   Noted no overlying erythema, induration, or other signs of local infection.   Skin prepped in a sterile fashion.   Local anesthesia: Topical Ethyl chloride.   With sterile technique and under real time ultrasound guidance:  40 mg of Kenalog and 4 mL of Marcaine injected easily.   Completed without difficulty   Pain partially resolved suggesting accurate placement of the medication.   Advised to call if fevers/chills, erythema, induration, drainage, or persistent bleeding.   Images permanently stored and available for review in the ultrasound unit.  Impression: Technically successful ultrasound guided injection.        Impression and Recommendations:    Assessment and Plan: 49 y.o. male with right knee pain ongoing for few months.  Likely secondary to MCL strain or medial meniscus tear as well as exacerbation of DJD.  Plan for injection as above Voltaren gel limited hydrocodone.  Try to fit a hinged knee brace however I do not have one that will fit in clinic today.  Will contact [employer] representative to see if we can order in a brace that will fit him better. Recheck 2 to 4 weeks..  Morbid obesity is obviously a factor here.  We will bring this up and discuss  it further at the next visit.  PDMP reviewed during this encounter. Orders Placed This Encounter  Procedures  . [place] LIMITED JOINT SPACE STRUCTURES LOW RIGHT    Standing Status:   Future    Number of Occurrences:   1    Standing Expiration Date:   08/30/2020    Order Specific  Question:   Reason for Exam (SYMPTOM  OR DIAGNOSIS REQUIRED)    Answer:   R knee pain    Order Specific Question:   Preferred imaging location?    Answer:   [place]   Meds ordered this encounter  Medications  . HYDROcodone-acetaminophen (NORCO/VICODIN) 5-325 MG tablet    Sig: Take 1 tablet by mouth every 6 (six) hours as needed.    Dispense:  15 tablet    Refill:  0    Discussed warning signs or symptoms. Please see discharge instructions. Patient expresses understanding.   The above documentation has been reviewed and is accurate and complete Lynne Leader

## 2019-07-01 NOTE — Telephone Encounter (Signed)
I submitted a PA for the pts. Christopher Skinner and it was denied by [employer], so the pts. Medication will need to be changed.

## 2019-07-01 NOTE — Telephone Encounter (Signed)
I sent alternate medicine Jardiance as replacement.  Let us see if this is covered

## 2019-07-01 NOTE — Telephone Encounter (Signed)
Called pt and informed him of the [employer] brace situation.

## 2019-07-01 NOTE — Telephone Encounter (Signed)
I discussed the brace situation with the [employer] representative.  They make a triple XL off-the-shelf brace that might fit.  Expect to hear from one of the [employer] representatives to discuss fitting you for the brace.  They should be able to get this done in [place].

## 2019-07-01 NOTE — Patient Instructions (Signed)
Thank you for coming in today. I will ask about a bigger brace.  Use the hydrocodone for severe pain,  Use over the counter voltaren gel up to 4x daily.  Recheck with me in 2-4 weeks.   I think you probably strained your MCL and perhaps tore your medial mensicus.    Medial Collateral Knee Ligament Sprain  The medial collateral ligament (MCL) is a tough band of tissue in the knee that connects the thigh bone to the shin bone. Your MCL prevents your knee from moving too far inward and helps to keep your knee stable. An MCL sprain is a stretch or tear in the MCL. What are the causes? This condition may be caused by:  A hard, direct hit (trauma) to the inside of your knee. This is a common cause.  Your knee falling inward when you run, change directions quickly (cut), jump, or pivot.  Repeatedly overstretching the MCL. What increases the risk? The following factors make you more likely to develop this condition:  Playing contact sports, such as wrestling or football.  Participating in sports that involve sudden movements of cutting, twisting, or turning. These movements are common in hockey, skiing, and soccer.  Having weak hip and core muscles. What are the signs or symptoms? Symptoms of this condition include:  Feeling or hearing a popping at the time of injury.  Pain on the inside of the knee.  Swelling in the knee.  Bruising around the knee.  Tenderness when pressing the inside of the knee.  Feeling unstable when you stand, like your knee will give way.  Difficulty walking on uneven surfaces. How is this diagnosed? This condition may be diagnosed based on:  Your medical history.  A physical exam.  Imaging tests, such as an X-ray, ultrasound, or MRI. During your physical exam, your health care provider will check for pain, limited motion, and instability. How is this treated? Treatment for this condition depends on how severe the injury is. Treatment may  include:  Keeping weight off the knee until swelling and pain improve.  Raising (elevating) the knee above the level of your heart. This helps to reduce swelling.  Icing the knee. This helps to reduce swelling.  Taking an NSAID, such as ibuprofen. This helps to reduce pain and swelling.  Using a knee brace, elastic sleeve, or crutches while the injury heals.  Using a knee brace when participating in athletic activities.  Doing rehab exercises (physical therapy).  Surgery. This may be needed if: ? Your MCL tore all the way through. ? Your knee is unstable. ? Your knee is not getting better with other treatments. Follow these instructions at home: If you have a brace or sleeve:  Wear it as told by your health care provider. Remove it only as told by your health care provider.  Loosen the brace or remove the sleeve if your toes tingle, become numb, or turn cold and blue.  Keep the brace or sleeve clean.  If the brace or sleeve is not waterproof: ? Do not let it get wet. ? Cover it with a watertight covering when you take a bath or shower. Managing pain, stiffness, and swelling   If directed, put ice on the inside area of your knee. ? If you have a removable brace or sleeve, remove it as told by your health care provider. ? Put ice in a plastic bag. ? Place a towel between your skin and the bag. ? Leave the ice on for  20 minutes, 2-3 times a day.  Move your foot and toes often to reduce stiffness and swelling.  Elevate the injured area above the level of your heart while you are sitting or lying down. Activity  Ask your health care provider when it is safe to drive if you have a brace or sleeve on your leg.  Return to your normal activities as told by your health care provider. Ask your health care provider what activities are safe for you.  Do exercises as told by your health care provider.  Do not use the injured leg to support your body weight until your health care  provider says that you can. Use crutches as told by your health care provider. General instructions  Take over-the-counter and prescription medicines only as told by your health care provider.  Do not use any products that contain nicotine or tobacco, such as cigarettes, e-cigarettes, and chewing tobacco. These can delay healing. If you need help quitting, ask your health care provider.  Keep all follow-up visits as told by your health care provider. This is important. How is this prevented?  Warm up and stretch before being active.  Cool down and stretch after being active.  Give your body time to rest between periods of activity.  Make sure to use equipment that fits you.  Be safe and responsible while being active. This will help you avoid falls.  Do at least 150 minutes of moderate-intensity exercise each week, such as brisk walking or water aerobics.  Maintain physical fitness, including: ? Strength. ? Flexibility. ? Cardiovascular fitness. ? Endurance. Contact a health care provider if:  Your symptoms do not improve.  Your symptoms get worse. Summary  An MCL sprain is a knee injury that is caused by stretching the MCL too far. The injury can involve a tear in the MCL.  Treatment for this condition depends on how severe the injury is. It may include rest, wearing a brace, or surgery.  Do not use the injured leg to support your body weight until your health care provider says that you can. Use crutches as told by your health care provider.  Contact a health care provider if your symptoms do not get better or they get worse.  Keep all follow-up visits as told by your health care provider. This is important. This information is not intended to replace advice given to you by your health care provider. Make sure you discuss any questions you have with your health care provider. Document Revised: 12/03/2017 Document Reviewed: 12/03/2017 Elsevier Patient Education  [place].

## 2019-07-02 NOTE — Progress Notes (Signed)
X-ray right knee shows no fractures.

## 2019-07-04 ENCOUNTER — Other Ambulatory Visit: Payer: Self-pay | Admitting: Medical

## 2019-07-04 DIAGNOSIS — K219 Gastro-esophageal reflux disease without esophagitis: Secondary | ICD-10-CM

## 2019-07-07 ENCOUNTER — Telehealth: Payer: Self-pay | Admitting: Family Medicine

## 2019-07-07 NOTE — Telephone Encounter (Signed)
Pt wife called to inquire if the knee brace we ordered for him has arrived. [phone]

## 2019-07-08 NOTE — Telephone Encounter (Signed)
Pt has been contacted by [facility] rep and will be getting brace this Monday at 1:30 pm.

## 2019-07-22 ENCOUNTER — Ambulatory Visit: Payer: 59 | Admitting: Family Medicine

## 2019-07-30 ENCOUNTER — Other Ambulatory Visit: Payer: Self-pay

## 2019-07-30 ENCOUNTER — Emergency Department (HOSPITAL_COMMUNITY): Payer: 59

## 2019-07-30 ENCOUNTER — Encounter (HOSPITAL_COMMUNITY): Payer: Self-pay

## 2019-07-30 ENCOUNTER — Emergency Department (HOSPITAL_COMMUNITY)
Admission: EM | Admit: 2019-07-30 | Discharge: 2019-07-30 | Disposition: A | Discharge status: Home / Self Care | Payer: 59 | Attending: Emergency Medicine | Admitting: Emergency Medicine

## 2019-07-30 DIAGNOSIS — S8991XA Unspecified injury of right lower leg, initial encounter: Secondary | ICD-10-CM

## 2019-07-30 DIAGNOSIS — Y999 Unspecified external cause status: Secondary | ICD-10-CM | POA: Diagnosis not present

## 2019-07-30 DIAGNOSIS — Z79899 Other long term (current) drug therapy: Secondary | ICD-10-CM | POA: Insufficient documentation

## 2019-07-30 DIAGNOSIS — Z7984 Long term (current) use of oral hypoglycemic drugs: Secondary | ICD-10-CM | POA: Insufficient documentation

## 2019-07-30 DIAGNOSIS — W010XXA Fall on same level from slipping, tripping and stumbling without subsequent striking against object, initial encounter: Secondary | ICD-10-CM | POA: Insufficient documentation

## 2019-07-30 DIAGNOSIS — I10 Essential (primary) hypertension: Secondary | ICD-10-CM | POA: Insufficient documentation

## 2019-07-30 DIAGNOSIS — E119 Type 2 diabetes mellitus without complications: Secondary | ICD-10-CM | POA: Insufficient documentation

## 2019-07-30 DIAGNOSIS — Y92009 Unspecified place in unspecified non-institutional (private) residence as the place of occurrence of the external cause: Secondary | ICD-10-CM | POA: Diagnosis not present

## 2019-07-30 DIAGNOSIS — Y9301 Activity, walking, marching and hiking: Secondary | ICD-10-CM | POA: Diagnosis not present

## 2019-07-30 MED ORDER — IBUPROFEN 800 MG PO TABS
800.0000 mg | ORAL_TABLET | Freq: Once | ORAL | Status: AC
  Administered 2019-07-30: 800 mg via ORAL
  Filled 2019-07-30: qty 1

## 2019-07-30 NOTE — ED Notes (Signed)
Pt in bed, pt reports R knee pain, ice packs placed, pt reports pain with movement, no swelling noted

## 2019-07-30 NOTE — ED Notes (Signed)
Ace wrap placed to R knee, pt states that this feels well

## 2019-07-30 NOTE — ED Triage Notes (Addendum)
Pt to er room number 8 via ems, pt states that he slipped out the door and the rug moved and his legs spit and he hurt his R knee.  Pt c/o R knee pain.

## 2019-07-30 NOTE — ED Notes (Signed)
Pt sitting up in Chair, pt states that he is ready to go home, family at bedside, pt ambulatory to [facility].  Pt verbalized understanding d/c instructions and follow up, family states that they will come back and get walker when it arrives from supply.  Phone number given, reviewed follow up and RICE, rest ice elevate and compress. Pt from dpt via wc with family

## 2019-07-30 NOTE — Discharge Instructions (Addendum)
As discussed, your x-ray was negative for any bony fractures.  Follow-up with your orthopedic doctor this week for further evaluation of your right knee pain.  You may take over-the-counter ibuprofen or Tylenol as needed for pain.  You may also use the walker provided in the ED as needed for ambulation.  Return to the ER for new or worsening symptoms.

## 2019-07-30 NOTE — ED Provider Notes (Signed)
[facility] EMERGENCY DEPARTMENT Provider Note   CSN: [id number] Arrival date & time: 07/30/19  1201     History Chief Complaint  Patient presents with  . Fall    Christopher Skinner is a 49 y.o. male with a past medical history significant for diabetes mellitus, hypertension, GERD, hyperlipidemia, and morbid obesity who presents to the ED due to sudden onset of right knee pain after falling directly on his right knee.  Patient states he was walking when he slipped on a rug which caused his left leg to go forward with his right leg to bend inward.  Patient notes he fell directly on the anterior aspect of his right knee.  Has previous history of right knee injury with sprained ACL and torn medial meniscus per the patient.  No treatment prior to arrival.  Denies associated numbness/tingling down the right lower extremity.  Rates pain a 10/10 worse with movement.  Patient unable to ambulate on his own without assistance following the accident.  Denies right hip pain and right ankle pain.  Denies head injury and loss of consciousness.  He is not currently on any blood thinners.  History obtained from patient and past medical records. No interpreter used during encounter.      Past Medical History:  Diagnosis Date  . COVID-19 05/02/2019  . Diabetes mellitus without complication ([place]) 5638  . GERD (gastroesophageal reflux disease)   . H/O exercise stress test 2009   reportedly normal per patient  . Hyperlipidemia   . Hypertension 2008  . Microalbuminuria 2019  . Obesity   . Sleep apnea    last used CPAP 2014; quit on his own    Patient Active Problem List   Diagnosis Date Noted  . Hepatomegaly 06/23/2019  . Aortic atherosclerosis ([place]) 06/23/2019  . Anemia 06/23/2019  . Chest pain 06/12/2019  . SOB (shortness of breath) 06/08/2019  . Cough 06/08/2019  . Fatigue 06/08/2019  . Chronic pain of both knees 04/16/2019  . Screening for heart disease 10/05/2018  . Essential  hypertension, benign 10/05/2018  . Hyperlipidemia 10/05/2018  . Microalbuminuria 10/05/2018  . Gastroesophageal reflux disease 10/05/2018  . Vaccine counseling 10/05/2018  . Type 2 diabetes mellitus with hyperglycemia ([place]) 10/05/2018  . OSA (obstructive sleep apnea) 10/05/2018  . Abnormal EKG 10/05/2018  . Morbid obesity ([place]) 10/26/2012    Past Surgical History:  Procedure Laterality Date  . CARPAL TUNNEL RELEASE     left  . COLONOSCOPY  2010   normal per patient  . HAND SURGERY     growth resection, right       Family History  Problem Relation Age of Onset  . Hyperlipidemia Other   . Hypertension Other   . Sleep apnea Other   . Obesity Other   . Stroke Mother   . Hypertension Mother   . Hypertension Father   . Hypertension Sister   . Hypertension Brother   . Hypertension Sister   . Hypertension Brother   . Hypertension Sister   . Hypertension Sister   . Hypertension Sister   . Hypertension Sister   . Hypertension Sister   . Hypertension Brother   . Hypertension Brother   . Hypertension Brother   . Hypertension Brother   . Hypertension Brother   . Hypertension Brother   . Hypertension Brother   . Hypertension Brother   . Hypertension Brother   . Hypertension Brother   . Hypertension Brother   . Hypertension Brother   . Hypertension  Brother   . Hypertension Brother   . Hypertension Brother   . Hypertension Sister   . Hypertension Sister   . Heart disease Neg Hx   . Cancer Neg Hx   . Diabetes Neg Hx     Social History   Tobacco Use  . Smoking status: Never Smoker  . Smokeless tobacco: Never Used  Substance Use Topics  . Alcohol use: No  . Drug use: No    Home Medications Prior to Admission medications   Medication Sig Start Date End Date Taking? Authorizing Provider  albuterol (PROVENTIL HFA;VENTOLIN HFA) 108 (90 Base) MCG/ACT inhaler Inhale 2 puffs into the lungs every 4 (four) hours as needed for wheezing or shortness of breath. 03/22/17   Yes Lacretia Leigh, MD  blood glucose meter kit and supplies KIT Dispense based on patient and insurance preference. Use 1-2 times daily as directed. (FOR ICD-10 DE11.69). 06/08/19  Yes Tysinger, [name], PA-C  Blood Glucose Monitoring Suppl (TRUE METRIX METER) DEVI 1 Device by Does not apply route daily. 06/10/19  Yes Tysinger, [name], PA-C  carvedilol (COREG) 12.5 MG tablet Take 1 tablet (12.5 mg total) by mouth 2 (two) times daily with a meal. 06/16/19  Yes Patwardhan, Manish J, MD  empagliflozin (JARDIANCE) 10 MG TABS tablet Take 10 mg by mouth daily before breakfast. 07/01/19  Yes Tysinger, [name], PA-C  glucose blood test strip True metrix strips to test 1-2 times daily 06/10/19  Yes Tysinger, [name], PA-C  HYDROcodone-acetaminophen (NORCO/VICODIN) 5-325 MG tablet Take 1 tablet by mouth every 6 (six) hours as needed. 07/01/19  Yes Gregor Hams, MD  Lancets MISC Use as instructed. Needs lancets based on pt's meter (true metrix) 06/10/19  Yes Tysinger, [name], PA-C  losartan-hydrochlorothiazide (HYZAAR) 100-12.5 MG tablet Take 1 tablet by mouth daily. 10/06/18  Yes Tysinger, [name], PA-C  metFORMIN (GLUCOPHAGE) 850 MG tablet Take 1 tablet (850 mg total) by mouth 2 (two) times daily with a meal. 06/15/19  Yes Geradine Girt, DO  RABEprazole (ACIPHEX) 20 MG tablet Take 1 tablet by mouth once daily 07/05/19  Yes Tysinger, [name], PA-C  vitamin B-12 (CYANOCOBALAMIN) 1000 MCG tablet Take 1 tablet (1,000 mcg total) by mouth daily. 06/23/19  Yes Tysinger, [name], PA-C    Allergies    Patient has no known allergies.  Review of Systems   Review of Systems  Constitutional: Negative for chills and fever.  Musculoskeletal: Positive for arthralgias (right knee), gait problem and joint swelling. Negative for back pain and neck pain.  Neurological: Negative for numbness.  All other systems reviewed and are negative.   Physical Exam Updated Vital Signs BP (!) 138/98 (BP Location: Right Arm)   Pulse 88    Temp 98.9 F (37.2 C) (Oral)   Resp 17   Ht _0  (1.753 m)   Wt (!) 219.1 kg   SpO2 100%   BMI 71.33 kg/m   Physical Exam Vitals and nursing note reviewed.  Constitutional:      General: He is not in acute distress.    Appearance: He is obese. He is not ill-appearing.  HENT:     Head: Normocephalic.  Eyes:     Conjunctiva/sclera: Conjunctivae normal.  Cardiovascular:     Rate and Rhythm: Normal rate and regular rhythm.     Pulses: Normal pulses.     Heart sounds: Normal heart sounds. No murmur. No friction rub. No gallop.   Pulmonary:     Effort:  Pulmonary effort is normal.     Breath sounds: Normal breath sounds.  Abdominal:     General: Abdomen is flat. There is no distension.     Palpations: Abdomen is soft.     Tenderness: There is no abdominal tenderness. There is no guarding or rebound.  Musculoskeletal:     Cervical back: Neck supple.     Comments: Tenderness to palpation over lateral aspect of right knee with limited ROM due to pain. Full ROM of right hip and right ankle. RLE neurovascularly intact. Soft compartments.   Skin:    General: Skin is warm and dry.  Neurological:     General: No focal deficit present.     Mental Status: He is alert.  Psychiatric:        Mood and Affect: Mood normal.        Behavior: Behavior normal.     ED Results / Procedures / Treatments   Labs (all labs ordered are listed, but only abnormal results are displayed) Labs Reviewed - No data to display  EKG None  Radiology DG Knee Complete 4 Views Right  Result Date: 07/30/2019 CLINICAL DATA:  Pain EXAM: RIGHT KNEE - COMPLETE 4+ VIEW COMPARISON:  07/01/2019 for FINDINGS: No evidence of fracture, dislocation, or joint effusion. No evidence of arthropathy or other focal bone abnormality. Soft tissues are unremarkable. IMPRESSION: Negative. Electronically Signed   By: Lucrezia Europe M.D.   On: 07/30/2019 13:18    Procedures Procedures (including critical care time)  Medications  Ordered in ED Medications  ibuprofen (ADVIL) tablet 800 mg (800 mg Oral Given 07/30/19 1225)    ED Course  I have reviewed the triage vital signs and the nursing notes.  Pertinent labs & imaging results that were available during my care of the patient were reviewed by me and considered in my medical decision making (see chart for details).    MDM Rules/Calculators/A&P                     49 year old male presents to the ED via EMS after a mechanical fall causing acute on chronic right knee pain.  Able vitals.  Patient no acute distress and non-ill-appearing.  Tenderness palpation over lateral aspect of right knee with limited range of motion due to pain.  Right lower extremity neurovascularly intact with soft compartments.  Full range of motion without tenderness to right hip and right ankle.  Will obtain x-ray to rule out bony fractures.  X-ray personally reviewed which is negative for any bony fractures or acute abnormalities. Patient able to ambulate in the room without difficulty. A walker is being delivered to the hospital later this afternoon for patient. Advised patient to follow-up with his orthopedic doctor next week for further evaluation. Instructed patient to take over the counter ibuprofen or Tylenol as needed for pain. Strict ED precautions discussed with patient. Patient states understanding and agrees to plan. Patient discharged home in no acute distress and stable vitals  Final Clinical Impression(s) / ED Diagnoses Final diagnoses:  Injury of right knee, initial encounter    Rx / DC Orders ED Discharge Orders    None       Karie Kirks 07/30/19 1601    Nat Christen, MD 08/02/19 [phone]

## 2019-07-31 NOTE — ED Notes (Signed)
Pt given prescription for bariatric walker to take to [facility]

## 2019-08-04 ENCOUNTER — Ambulatory Visit: Payer: Self-pay

## 2019-08-04 ENCOUNTER — Other Ambulatory Visit: Payer: Self-pay

## 2019-08-04 ENCOUNTER — Ambulatory Visit (INDEPENDENT_AMBULATORY_CARE_PROVIDER_SITE_OTHER): Payer: 59 | Admitting: Family Medicine

## 2019-08-04 ENCOUNTER — Encounter: Payer: Self-pay | Admitting: Family Medicine

## 2019-08-04 VITALS — BP 142/80 | HR 103 | Ht 69.0 in | Wt >= 6400 oz

## 2019-08-04 DIAGNOSIS — M25561 Pain in right knee: Secondary | ICD-10-CM

## 2019-08-04 DIAGNOSIS — S8991XA Unspecified injury of right lower leg, initial encounter: Secondary | ICD-10-CM

## 2019-08-04 NOTE — Progress Notes (Signed)
I, [name], LAT, ATC, am serving as scribe for Dr. Lynne Leader.  Christopher Skinner is a 49 y.o. male who presents to [place] [facility] today for R knee pain f/u.  He was last seen by Dr. Georgina Snell on 07/01/19 and had progressively worsening knee pain x 2 weeks.  He received a corticosteroid injection in his R knee and an order was placed for him to obtained a hinged knee brace.  Subsequently, he was seen at the [facility] ED on 07/30/19 after suffering a fall w/ his R knee falling into a valgus stress.  Since his trip to the ED, pt reports that his R knee pain is improved and is now able to put weight through his R LE.  He has been icing his R knee and using a compression bandage.  He con't to take the hydrocodone-acetaminophen to help control his pain.  He locates his pain to his R lateral knee currently whereas before his pain was more medial.  He reports that he had to re-schedule his appt w/ the [employer] rep so will be getting fitted for a R knee brace this week.  Diagnostic imaging: R knee XR- 06/13/19, 07/30/19  Pertinent review of systems: No fevers or chills  Relevant historical information: Morbid obesity   Exam:  BP (!) 142/80 (BP Location: Left Arm, Patient Position: Sitting, Cuff Size: Large)   Pulse (!) 103   Ht 5\' 9"  (1.753 m)   Wt (!) 494 lb (224.1 kg)   SpO2 97%   BMI 72.95 kg/m  General: Well Developed, well nourished, and in no acute distress.   MSK: Right knee relatively normal-appearing with no deformity or severe effusion.  Range of motion 0-100 degrees. Mildly tender palpation lateral joint line nontender otherwise. Stable ligamentous exam without laxity. Unable to perform McMurray's testing due to guarding. Intact strength to flexion and extension.    Lab and Radiology Results EXAM: RIGHT KNEE - COMPLETE 4+ VIEW  COMPARISON:  07/01/2019 for  FINDINGS: No evidence of fracture, dislocation, or joint effusion. No evidence of  arthropathy or other focal bone abnormality. Soft tissues are unremarkable.  IMPRESSION: Negative.   Electronically Signed   By: Lucrezia Europe M.D.   On: 07/30/2019 13:18 I, Lynne Leader, personally (independently) visualized and performed the interpretation of the images attached in this note.  Diagnostic Limited MSK Ultrasound of: Right knee Quad tendon intact appearing mild joint effusion present. Patellar tendon intact. Medial joint line narrowed with degenerative appearing meniscus no MCL full-thickness tear visible. Lateral joint line narrowed with degenerative appearing meniscus.  LCL is intact without obvious tear. 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     Assessment and Plan: 49 y.o. male with exacerbation of knee pain after recent fall.  Patient likely strained LCL or lateral meniscus tear.  Ideally neck step would be hinged knee brace.  He is already being fitted for a custom hinged knee brace due to his body habitus which should arrive soon.  Fortunately he is improved quite a bit since his emergency room visit on April 2.  Plan for continued relative rest and Voltaren gel and knee brace.  Recheck in 1 month.  Could consider repeat injection at that point if needed.  He had great benefit from injection last month.   PDMP not reviewed this encounter. Orders Placed This Encounter  Procedures  . [place] LIMITED JOINT SPACE STRUCTURES LOW RIGHT(NO LINKED CHARGES)    Order Specific Question:  Reason for Exam (SYMPTOM  OR DIAGNOSIS REQUIRED)    Answer:   R knee pain    Order Specific Question:   Preferred imaging location?    Answer:   [place]   No orders of the defined types were placed in this encounter.    Discussed warning signs or symptoms. Please see discharge instructions. Patient expresses understanding.   The above documentation has been reviewed and is accurate and complete Lynne Leader   New right knee injury visit today.

## 2019-08-04 NOTE — Patient Instructions (Signed)
Thank you for coming in today. We will plan for the knee brace.  Recheck in about 1 month.  Return sooner if needed.  Let me know if you are having a problem.

## 2019-09-07 ENCOUNTER — Encounter: Payer: Self-pay | Admitting: Family Medicine

## 2019-09-07 ENCOUNTER — Ambulatory Visit: Payer: 59 | Admitting: Family Medicine

## 2019-09-07 ENCOUNTER — Ambulatory Visit (INDEPENDENT_AMBULATORY_CARE_PROVIDER_SITE_OTHER): Payer: 59 | Admitting: Family Medicine

## 2019-09-07 ENCOUNTER — Other Ambulatory Visit: Payer: Self-pay

## 2019-09-07 ENCOUNTER — Ambulatory Visit: Payer: Self-pay

## 2019-09-07 VITALS — BP 140/100 | HR 98 | Ht 69.0 in | Wt >= 6400 oz

## 2019-09-07 DIAGNOSIS — M25561 Pain in right knee: Secondary | ICD-10-CM

## 2019-09-07 NOTE — Progress Notes (Signed)
Christopher Skinner, am serving as a Education administrator for Dr. Lynne Leader.  Christopher Skinner is a 49 y.o. male who presents to [place] at [facility] today for Follow up on right knee was last seen by Dr. Georgina Snell om 08/04/2019 where patient reported R knee pain is improved and is now able to put weight through his R LE.  He has been icing his R knee and using a compression bandage.  He con't to take the hydrocodone-acetaminophen to help control his pain.  He locates his pain to his R lateral knee currently whereas before his pain was more medial.  He reports that he had to re-schedule his appt w/ the DJO rep so will be getting fitted for a R knee brace this week. Since last visit patient reports he has had his brace for about a month now and it seems to help a little bit, but the pain is now back to the medial R knee where it originally started. Patient is having to walk very slowly and with a limp and is wanting to get a handicap sticker.    Pertinent review of systems: No fevers or chills  Relevant historical information: Morbid obesity   Exam:  Ht 5\' 9"  (1.753 m)   BMI 72.95 kg/m  General: Well Developed, well nourished, and in no acute distress.   MSK: Right knee small joint effusion however difficult to tell due to body habitus. 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 Antalgic gait.    Lab and Radiology Results  Procedure: Real-time Ultrasound Guided Injection of right knee superior patellar space Device: Philips Affiniti 50G Images permanently stored and available for review in the ultrasound unit. Verbal informed consent obtained.  Discussed risks and benefits of procedure. Warned about infection bleeding damage to structures skin hypopigmentation and fat atrophy among others. Patient expresses understanding and agreement Time-out conducted.   Noted no overlying erythema, induration, or other signs of local infection.   Skin prepped in a sterile  fashion.   Local anesthesia: Topical Ethyl chloride.   With sterile technique and under real time ultrasound guidance:  40 mg of Kenalog and 3 mL of Marcaine injected easily.   Completed without difficulty   Pain immediately resolved suggesting accurate placement of the medication.   Advised to call if fevers/chills, erythema, induration, drainage, or persistent bleeding.   Images permanently stored and available for review in the ultrasound unit.  Impression: Technically successful ultrasound guided injection.      Assessment and Plan: 49 y.o. male with right knee pain due to DJD.  Patient may have a component of meniscus tear causing his pain as well.  Additionally less likely could be due to ACL tear.  Lateral collateral ligament tear less likely given stable ligamentous exam previously. Discussed next steps if this repeat steroid injection does not last very long.  Could consider hyaluronic acid injection or potentially plan for MRI.  However his BMI is 72.  He is not going to be a very good surgical candidate for most of the things that were going to find an MRI such as meniscus tear or ACL tear.  Additionally his BMI precludes total joint replacement at this time as well. Discussed this with him.  Likely will proceed with hyaluronic acid if needed however hopeful that repeat steroid injection will provide better relief.     Discussed warning signs or symptoms. Please see discharge instructions. Patient expresses understanding.   The above documentation has been reviewed  and is accurate and complete Lynne Leader

## 2019-09-07 NOTE — Progress Notes (Deleted)
   I, [name], LAT, ATC, am serving as scribe for Dr. Lynne Leader.  Christopher Skinner is a 49 y.o. male who presents to [place] at [facility] today for f/u of R knee pain.  He was last seen by Dr. Georgina Snell on 08/04/19 and was prescribed hydrocodone-acetaminophen and referred for a custom knee brace w/ DJO.  He had a R knee injection on 07/01/19.  Since his last visit, pt reports  Diagnostic testing: R knee XR- 07/30/19, 07/01/19  Pertinent review of systems: ***  Relevant historical information: ***   Exam:  There were no vitals taken for this visit. General: Well Developed, well nourished, and in no acute distress.   MSK: ***    Lab and Radiology Results No results found for this or any previous visit (from the past 72 hour(s)). No results found.     Assessment and Plan: 49 y.o. male with ***   PDMP not reviewed this encounter. No orders of the defined types were placed in this encounter.  No orders of the defined types were placed in this encounter.    Discussed warning signs or symptoms. Please see discharge instructions. Patient expresses understanding.   ***

## 2019-09-07 NOTE — Patient Instructions (Signed)
Thank you for coming in today. Recheck in about 1 month.  If not better next step is likely Gel shots.  Could consider MRI also.

## 2019-09-20 ENCOUNTER — Encounter: Payer: Self-pay | Admitting: Medical

## 2019-09-20 ENCOUNTER — Ambulatory Visit (INDEPENDENT_AMBULATORY_CARE_PROVIDER_SITE_OTHER): Payer: 59 | Admitting: Medical

## 2019-09-20 ENCOUNTER — Other Ambulatory Visit: Payer: Self-pay

## 2019-09-20 VITALS — BP 138/84 | HR 89 | Ht 69.0 in | Wt >= 6400 oz

## 2019-09-20 DIAGNOSIS — R160 Hepatomegaly, not elsewhere classified: Secondary | ICD-10-CM

## 2019-09-20 DIAGNOSIS — E1165 Type 2 diabetes mellitus with hyperglycemia: Secondary | ICD-10-CM | POA: Diagnosis not present

## 2019-09-20 DIAGNOSIS — M25561 Pain in right knee: Secondary | ICD-10-CM

## 2019-09-20 DIAGNOSIS — R809 Proteinuria, unspecified: Secondary | ICD-10-CM

## 2019-09-20 DIAGNOSIS — E785 Hyperlipidemia, unspecified: Secondary | ICD-10-CM

## 2019-09-20 DIAGNOSIS — I10 Essential (primary) hypertension: Secondary | ICD-10-CM | POA: Diagnosis not present

## 2019-09-20 DIAGNOSIS — Z1211 Encounter for screening for malignant neoplasm of colon: Secondary | ICD-10-CM

## 2019-09-20 DIAGNOSIS — G8929 Other chronic pain: Secondary | ICD-10-CM

## 2019-09-20 DIAGNOSIS — I700 Atherosclerosis of aorta: Secondary | ICD-10-CM

## 2019-09-20 DIAGNOSIS — D649 Anemia, unspecified: Secondary | ICD-10-CM

## 2019-09-20 MED ORDER — ROSUVASTATIN CALCIUM 40 MG PO TABS: 40.0000 mg | ORAL_TABLET | Freq: Every day | ORAL | 3 refills | Status: DC

## 2019-09-20 NOTE — Patient Instructions (Signed)
We are rechecking diabetes labs today  Recommendations  Follow up with Dr. Amalia Hailey about pain, gel injection  Consider arm bike for exercise at the gym.   30 minutes daily with gradual increase to 60 minutes daily over the next 1.5 months  Drink water throughout the day  Restart Crestor /Rosuvastatin cholesterol medicaiton at bedtime daily, ongoing  Your blood pressure looks good today. Continue the same BP medications  We will refer to gastroenterology for colonoscopy and anemia evaluation  Let me know if you want to repeat sleep study.   If we were to restart a type of CPAP mask, we would need a new sleep study if last one was > 24 years old  Pending labs, I will likely make some changes to the diabetes medicaiton to help with weight loss efforts   Exercise: Arm bicycle daily   Low Carb Diet recommendations:  I recommend you drink water throughout the day.   70 ounces or 2 liters would be a good amount.  If you have been accustomed to drinking juice or soda, try water with lemon or water with lime, or try using no calorie flavor dropper.  I recommend the following as an example meal plan that includes 3 meals per day.   You can skip some meals periodically for intermittent fasting.  Breakfast (choose one): Marland Kitchen Omelette with egg, can include a little bit of cheese, your choice of mushrooms, peppers, onions, salsa . Smoothie with handful of spinach or kale, 1 cup of milk or water, 1 cup of berries, 1 packet of artificial sweetener such as stevia . Yogurt with fruit   Mid morning snack: . 1 fruit serving and 1 protein such as 8 almonds or 8 nuts, or vegetable such as carrots and humus or other similar vegetable   Lunch: . Salad with 3-4 ounces of lean grilled or baked meat such as fish, chicken or [place]   Mid afternoon snack: . 1 fruit serving and 1 protein such as 8 almonds or 8 nuts, or vegetable such as carrots and humus or other similar vegetable   Dinner: . Large  serving of vegetables and 3-4 ounces of lean grilled or baked meat such as fish, chicken or [place] . Or vegetarian dish without meat    Avoid  . Chips, cookies, cake, donuts, soda, sweet tea, juices, candy, fast food . For now , avoid, or significantly limit grains

## 2019-09-20 NOTE — Progress Notes (Signed)
Subjective:  Christopher Skinner is a 49 y.o. male who presents for Chief Complaint  Patient presents with  . Follow-up    sob      Here for med check and follow-up  I saw him in February for shortness of breath.  He ends up and seen by the emergency department to rule out PE/DVT.  He denies shortness of breath now  His main ongoing problems with right knee pain.  He saw orthopedics, had steroid injection, but still having problems.  He wants advice on whether this can be done.  He is wearing a brace  Here for follow-up on diabetes.  He does note some polyuria polydipsia but no blurred vision.  He is compliant with Metformin and Farxiga.  He is compliant with his blood pressure medicine without complaint  He is not sure if he is taking the cholesterol medicine or not, may have run out of this  He cannot really exercise due to the right knee pain.  He is not currently going to the gym  He has a history of prior CPAP use but not using CPAP in several years.  Last study was over 5 years ago.  He could not tolerate the CPAP   no other aggravating or relieving factors.    No other c/o.  Past Medical History:  Diagnosis Date  . COVID-19 05/02/2019  . Diabetes mellitus without complication ([place]) 1749  . GERD (gastroesophageal reflux disease)   . H/O exercise stress test 2009   reportedly normal per patient  . Hyperlipidemia   . Hypertension 2008  . Microalbuminuria 2019  . Obesity   . Sleep apnea    last used CPAP 2014; quit on his own   Current Outpatient Medications on File Prior to Visit  Medication Sig Dispense Refill  . blood glucose meter kit and supplies KIT Dispense based on patient and insurance preference. Use 1-2 times daily as directed. (FOR ICD-10 DE11.69). 1 each 0  . Blood Glucose Monitoring Suppl (TRUE METRIX METER) DEVI 1 Device by Does not apply route daily. 1 each 0  . carvedilol (COREG) 12.5 MG tablet Take 1 tablet (12.5 mg total) by mouth 2 (two) times  daily with a meal. 60 tablet 1  . empagliflozin (JARDIANCE) 10 MG TABS tablet Take 10 mg by mouth daily before breakfast. 90 tablet 1  . glucose blood test strip True metrix strips to test 1-2 times daily 100 each 12  . Lancets MISC Use as instructed. Needs lancets based on pt's meter (true metrix) 100 each 12  . losartan-hydrochlorothiazide (HYZAAR) 100-12.5 MG tablet Take 1 tablet by mouth daily. 90 tablet 3  . metFORMIN (GLUCOPHAGE) 850 MG tablet Take 1 tablet (850 mg total) by mouth 2 (two) times daily with a meal. 60 tablet 3  . RABEprazole (ACIPHEX) 20 MG tablet Take 1 tablet by mouth once daily 90 tablet 0  . vitamin B-12 (CYANOCOBALAMIN) 1000 MCG tablet Take 1 tablet (1,000 mcg total) by mouth daily. 90 tablet 3  . albuterol (PROVENTIL HFA;VENTOLIN HFA) 108 (90 Base) MCG/ACT inhaler Inhale 2 puffs into the lungs every 4 (four) hours as needed for wheezing or shortness of breath. (Patient not taking: Reported on 09/20/2019) 1 Inhaler 0  . HYDROcodone-acetaminophen (NORCO/VICODIN) 5-325 MG tablet Take 1 tablet by mouth every 6 (six) hours as needed. (Patient not taking: Reported on 09/20/2019) 15 tablet 0   No current facility-administered medications on file prior to visit.    The following portions of  the patient's history were reviewed and updated as appropriate: allergies, current medications, past family history, past medical history, past social history, past surgical history and problem list.  ROS Otherwise as in subjective above    Objective: BP 138/84   Pulse 89   Ht _0  (1.753 m)   Wt (!) 486 lb 3.2 oz (220.5 kg)   SpO2 97%   BMI 71.80 kg/m   General appearance: alert, no distress, well developed, well nourished Morbidly obese Neck: supple, no lymphadenopathy, no thyromegaly, no masses, no bruits Heart: RRR, normal S1, S2, no murmurs Lungs: CTA bilaterally, no wheezes, rhonchi, or rales Pulses: 2+ radial pulses, 2+ pedal pulses, normal cap refill Ext: no  edema   Assessment: Encounter Diagnoses  Name Primary?  . Type 2 diabetes mellitus with hyperglycemia, unspecified whether long term insulin use ([place]) Yes  . Hyperlipidemia, unspecified hyperlipidemia type   . Aortic atherosclerosis ([place])   . Essential hypertension, benign   . Morbid obesity ([place])   . Microalbuminuria   . Anemia, unspecified type   . Hepatomegaly   . Chronic pain of right knee   . Screen for colon cancer      Plan: Diabetes type 2-labs today, congratulated him on some recent weight loss.  Discussed glucose monitoring, healthy diet and exercise, continue Metformin and Jardiance  Hyperlipidemia, aortic atherosclerosis -restart Crestor at the 40 mg tablet daily.  Reviewed recent labs in the chart record  Hypertension-continue current medication  Obesity-counseled on need for lifestyle changes, diet changes.  We spent some time discussing healthy food options.  Consider nutrition consult but he declines for now.  Microalbuminuria-on ARB  Anemia-referral for GI consult for colonoscopy, check an iron level today   Right knee pain, DJD - Reviewed ortho note from 09/07/2019 regarding right knee pain with Dr. Lynne Leader.    they suspect meniscus tear vs ligament tear.  They did steroid joint injection, but if not improving, they are considering hyaluronic acid injection or plan for MRI.  His BMI precludes joint replacement.  He will follow up with orthopedics regarding his chronic knee pain, and likely gel injection and /or MRI   Christopher Skinner was seen today for follow-up.  Diagnoses and all orders for this visit:  Type 2 diabetes mellitus with hyperglycemia, unspecified whether long term insulin use ([facility]) -     Hemoglobin A1c -     Comprehensive metabolic panel  Hyperlipidemia, unspecified hyperlipidemia type -     rosuvastatin (CRESTOR) 40 MG tablet; Take 1 tablet (40 mg total) by mouth at bedtime.  Aortic atherosclerosis (HCC)  Essential hypertension, benign -      Hemoglobin A1c  Morbid obesity ([facility])  Microalbuminuria  Anemia, unspecified type -     Hemoglobin A1c -     Iron -     Ambulatory referral to Gastroenterology  Hepatomegaly  Chronic pain of right knee  Screen for colon cancer -     Ambulatory referral to Gastroenterology    Follow up: Pending labs

## 2019-09-20 NOTE — Progress Notes (Signed)
Done

## 2019-09-21 ENCOUNTER — Other Ambulatory Visit: Payer: Self-pay | Admitting: Medical

## 2019-09-21 ENCOUNTER — Encounter: Payer: Self-pay | Admitting: Internal Medicine

## 2019-09-21 DIAGNOSIS — E1165 Type 2 diabetes mellitus with hyperglycemia: Secondary | ICD-10-CM

## 2019-09-21 LAB — HEMOGLOBIN A1C
Est. average glucose Bld gHb Est-mCnc: 180 mg/dL
Hgb A1c MFr Bld: 7.9 % — ABNORMAL HIGH (ref 4.8–5.6)

## 2019-09-21 LAB — COMPREHENSIVE METABOLIC PANEL
ALT: 32 IU/L (ref 0–44)
AST: 23 IU/L (ref 0–40)
Albumin/Globulin Ratio: 1.3 (ref 1.2–2.2)
Albumin: 4.3 g/dL (ref 4.0–5.0)
Alkaline Phosphatase: 86 IU/L (ref 48–121)
BUN/Creatinine Ratio: 15 (ref 9–20)
BUN: 22 mg/dL (ref 6–24)
Bilirubin Total: 0.3 mg/dL (ref 0.0–1.2)
CO2: 20 mmol/L (ref 20–29)
Calcium: 9.8 mg/dL (ref 8.7–10.2)
Chloride: 104 mmol/L (ref 96–106)
Creatinine, Ser: 1.42 mg/dL — ABNORMAL HIGH (ref 0.76–1.27)
GFR calc Af Amer: 66 mL/min/{1.73_m2} (ref >59)
GFR calc non Af Amer: 57 mL/min/{1.73_m2} — ABNORMAL LOW (ref >59)
Globulin, Total: 3.4 g/dL (ref 1.5–4.5)
Glucose: 143 mg/dL — ABNORMAL HIGH (ref 65–99)
Potassium: 5 mmol/L (ref 3.5–5.2)
Sodium: 138 mmol/L (ref 134–144)
Total Protein: 7.7 g/dL (ref 6.0–8.5)

## 2019-09-21 LAB — IRON: Iron: 61 ug/dL (ref 38–169)

## 2019-09-21 MED ORDER — METFORMIN HCL 850 MG PO TABS: 850.0000 mg | ORAL_TABLET | Freq: Two times a day (BID) | ORAL | 3 refills | Status: DC

## 2019-09-21 MED ORDER — OZEMPIC (0.25 OR 0.5 MG/DOSE) 2 MG/1.5ML ~~LOC~~ SOPN: 0.5000 mg | PEN_INJECTOR | SUBCUTANEOUS | 2 refills | Status: DC

## 2019-10-05 ENCOUNTER — Ambulatory Visit: Payer: 59 | Admitting: Family Medicine

## 2019-10-05 NOTE — Progress Notes (Deleted)
   I, [name], LAT, ATC, am serving as scribe for Dr. Lynne Leader.  Christopher Skinner is a 49 y.o. male who presents to [place] at [facility] today for f/u of R knee pain.  He was last seen by Dr. Georgina Snell on 09/07/19 and had a R knee injection.  He has been fitted for a DJO knee brace and has been prescribed hydrocodone-acetaminophen.  Since his last visit, pt reports  Diagnostic testing: R knee XR- 07/30/19   Pertinent review of systems: ***  Relevant historical information: ***   Exam:  There were no vitals taken for this visit. General: Well Developed, well nourished, and in no acute distress.   MSK: ***    Lab and Radiology Results No results found for this or any previous visit (from the past 72 hour(s)). No results found.     Assessment and Plan: 49 y.o. male with ***   PDMP not reviewed this encounter. No orders of the defined types were placed in this encounter.  No orders of the defined types were placed in this encounter.    Discussed warning signs or symptoms. Please see discharge instructions. Patient expresses understanding.   ***

## 2019-10-11 ENCOUNTER — Ambulatory Visit: Payer: Self-pay

## 2019-10-11 ENCOUNTER — Ambulatory Visit (INDEPENDENT_AMBULATORY_CARE_PROVIDER_SITE_OTHER): Payer: 59 | Admitting: Family Medicine

## 2019-10-11 ENCOUNTER — Encounter: Payer: Self-pay | Admitting: Family Medicine

## 2019-10-11 VITALS — BP 120/82 | HR 100 | Ht 69.0 in | Wt >= 6400 oz

## 2019-10-11 DIAGNOSIS — M25561 Pain in right knee: Secondary | ICD-10-CM | POA: Diagnosis not present

## 2019-10-11 NOTE — Progress Notes (Signed)
   Rito Ehrlich, am serving as a Education administrator for Dr. Lynne Leader.  Christopher Skinner is a 49 y.o. male who presents to [place] at [facility] today for Follow up of right knee pain. Patient was last seen by Dr. Georgina Snell on 09/07/2019 and stated he has had his brace for about a month now and it seems to help a little bit, but the pain is now back to the medial R knee where it originally started. Patient is having to walk very slowly and with a limp. Next step is Gel shots, ran for orthovisc and awaiting insurance coverage. Since last visit patient states states knee is feeling a lot better about 90% better. States can go 4-5 days no problems then will get a pop feeling and have a small amount of pain.    Pertinent review of systems: No fevers or chills  Relevant historical information: Morbid obesity   Exam:  BP 120/82 (BP Location: Left Arm, Patient Position: Sitting, Cuff Size: Large)   Pulse 100   Ht 5\' 9"  (1.753 m)   Wt (!) 488 lb (221.4 kg)   SpO2 96%   BMI 72.07 kg/m  General: Well Developed, well nourished, and in no acute distress.   MSK: Right knee normal motion mild antalgic gait.       Assessment and Plan: 49 y.o. male with right knee pain improved.  Patient is doing a lot of home exercises on his own.  We will hold off on any further invasive management at this time.  We will go ahead and start working on authorization now for hyaluronic acid injection so can proceed with that the future if needed.  Check back with me as needed.   PDMP not reviewed this encounter. Orders Placed This Encounter  Procedures  . [place] LIMITED JOINT SPACE STRUCTURES LOW RIGHT    Standing Status:   Future    Number of Occurrences:   1    Standing Expiration Date:   10/10/2020    Order Specific Question:   Reason for Exam (SYMPTOM  OR DIAGNOSIS REQUIRED)    Answer:   right knee pain    Order Specific Question:   Preferred imaging location?    Answer:   [facility]   No orders of the defined types were placed in this encounter.    Discussed warning signs or symptoms. Please see discharge instructions. Patient expresses understanding.   The above documentation has been reviewed and is accurate and complete Lynne Leader, M.D.

## 2019-10-11 NOTE — Patient Instructions (Addendum)
Thank you for coming in today.  Keep doing what you are doing.  We will get the gel shots approved soon.  Can do those in the future.  Keep me updated and recheck as needed.

## 2019-10-21 ENCOUNTER — Ambulatory Visit: Payer: 59 | Admitting: Medical

## 2019-10-26 ENCOUNTER — Encounter: Payer: Self-pay | Admitting: Medical

## 2019-10-26 ENCOUNTER — Other Ambulatory Visit: Payer: Self-pay

## 2019-10-26 ENCOUNTER — Ambulatory Visit (INDEPENDENT_AMBULATORY_CARE_PROVIDER_SITE_OTHER): Payer: 59 | Admitting: Medical

## 2019-10-26 VITALS — BP 128/76 | HR 88 | Ht 69.0 in | Wt >= 6400 oz

## 2019-10-26 DIAGNOSIS — G4733 Obstructive sleep apnea (adult) (pediatric): Secondary | ICD-10-CM

## 2019-10-26 DIAGNOSIS — R809 Proteinuria, unspecified: Secondary | ICD-10-CM

## 2019-10-26 DIAGNOSIS — I700 Atherosclerosis of aorta: Secondary | ICD-10-CM | POA: Diagnosis not present

## 2019-10-26 DIAGNOSIS — E785 Hyperlipidemia, unspecified: Secondary | ICD-10-CM

## 2019-10-26 DIAGNOSIS — E1165 Type 2 diabetes mellitus with hyperglycemia: Secondary | ICD-10-CM

## 2019-10-26 DIAGNOSIS — Z7185 Encounter for immunization safety counseling: Secondary | ICD-10-CM

## 2019-10-26 DIAGNOSIS — D649 Anemia, unspecified: Secondary | ICD-10-CM

## 2019-10-26 DIAGNOSIS — I10 Essential (primary) hypertension: Secondary | ICD-10-CM | POA: Diagnosis not present

## 2019-10-26 DIAGNOSIS — Z1211 Encounter for screening for malignant neoplasm of colon: Secondary | ICD-10-CM

## 2019-10-26 DIAGNOSIS — Z7189 Other specified counseling: Secondary | ICD-10-CM

## 2019-10-26 MED ORDER — OZEMPIC (1 MG/DOSE) 2 MG/1.5ML ~~LOC~~ SOPN: 1.0000 mg | PEN_INJECTOR | SUBCUTANEOUS | 11 refills | Status: DC

## 2019-10-26 MED ORDER — CARVEDILOL 12.5 MG PO TABS: 12.5000 mg | ORAL_TABLET | Freq: Two times a day (BID) | ORAL | 1 refills | Status: DC

## 2019-10-26 MED ORDER — LOSARTAN POTASSIUM-HCTZ 100-12.5 MG PO TABS: 1.0000 | ORAL_TABLET | Freq: Every day | ORAL | 3 refills | Status: DC

## 2019-10-26 NOTE — Patient Instructions (Signed)
Let us know if you can't get in touch with [facility] about sleep study  Expect a call back about bariatric clinic appointment    Vaccines: As a diabetic and being over 67, we recommend the following vaccines:   We recommend a yearly flu shot  Shingles vaccine:  I recommend you have a shingles vaccine to help prevent shingles or herpes zoster outbreak.   Please call your insurer to inquire about coverage for the Shingrix vaccine given in 2 doses.   Some insurers cover this vaccine after age 35, some cover this after age 32.  If your insurer covers this, then call to schedule appointment to have this vaccine here.  We recommend the pneumococcal 23 pneumonia vaccine once  We recommend Covid vaccine  We recommend screening for hepatis and vaccination if not immune

## 2019-10-26 NOTE — Progress Notes (Signed)
Subjective: Chief Complaint  Patient presents with  . Follow-up    diabetes    Here for 1 month follow-up on diabetes. Last visit we added Ozempic weekly, continue Jardiance and Metformin.  He is taking Jardiance 10 mg daily, Metformin 850 mg twice daily.  He is currently using 0.55m of the Ozempic weekly.  He is on 2 weeks of the 0.526mdaily.   Having no side effects of the medicaiton other than some mild nausea.   Had bariatric clinic consult in the past but was lost to follow up.    Hypertension-compliant with carvedilol 12.5 mg twice daily, losartan HCT 100/12.5 mg daily  Hyperlipidemia-last visit we restarted Crestor 40 mg daily as he was not taking this.  Last visit we referred to gastroenterology for colonoscopy screening and for anemia.  The appt is a ways off in August though.  History of sleep apnea- last visit I encouraged him to repeat sleep study since his last one was over 50ears old and he is not compliant with CPAP.  He has been in contact with [facility] is awaiting call back about updated sleep study.   Past Medical History:  Diagnosis Date  . COVID-19 05/02/2019  . Diabetes mellitus without complication [id number]. GERD (gastroesophageal reflux disease)   . H/O exercise stress test 2009   reportedly normal per patient  . Hyperlipidemia   . Hypertension 2008  . Microalbuminuria 2019  . Obesity   . Sleep apnea    last used CPAP 2014; quit on his own   Current Outpatient Medications on File Prior to Visit  Medication Sig Dispense Refill  . blood glucose meter kit and supplies KIT Dispense based on patient and insurance preference. Use 1-2 times daily as directed. (FOR ICD-10 DE11.69). 1 each 0  . Blood Glucose Monitoring Suppl (TRUE METRIX METER) DEVI 1 Device by Does not apply route daily. 1 each 0  . empagliflozin (JARDIANCE) 10 MG TABS tablet Take 10 mg by mouth daily before breakfast. 90 tablet 1  . glucose blood test strip True metrix strips to test  1-2 times daily 100 each 12  . Lancets MISC Use as instructed. Needs lancets based on pt's meter (true metrix) 100 each 12  . metFORMIN (GLUCOPHAGE) 850 MG tablet Take 1 tablet (850 mg total) by mouth 2 (two) times daily with a meal. 180 tablet 3  . RABEprazole (ACIPHEX) 20 MG tablet Take 1 tablet by mouth once daily 90 tablet 0  . rosuvastatin (CRESTOR) 40 MG tablet Take 1 tablet (40 mg total) by mouth at bedtime. 90 tablet 3  . vitamin B-12 (CYANOCOBALAMIN) 1000 MCG tablet Take 1 tablet (1,000 mcg total) by mouth daily. 90 tablet 3  . albuterol (PROVENTIL HFA;VENTOLIN HFA) 108 (90 Base) MCG/ACT inhaler Inhale 2 puffs into the lungs every 4 (four) hours as needed for wheezing or shortness of breath. (Patient not taking: Reported on 10/26/2019) 1 Inhaler 0  . HYDROcodone-acetaminophen (NORCO/VICODIN) 5-325 MG tablet Take 1 tablet by mouth every 6 (six) hours as needed. (Patient not taking: Reported on 10/26/2019) 15 tablet 0   No current facility-administered medications on file prior to visit.   Review of systems as in subjective   Objective: BP 128/76   Pulse 88   Ht _0  (1.753 m)   Wt (!) 487 lb 3.2 oz (221 kg)   SpO2 96%   BMI 71.95 kg/m   Wt Readings from Last 3 Encounters:  10/26/19 (!) 487  lb 3.2 oz (221 kg)  10/11/19 (!) 488 lb (221.4 kg)  09/20/19 (!) 486 lb 3.2 oz (220.5 kg)   General appearence: alert, no distress, WD/WN,  Heart: RRR, normal S1, S2, no murmurs Lungs: CTA bilaterally, no wheezes, rhonchi, or rales Pulses: 2+ symmetric, upper and lower extremities, normal cap refill Ext: no edema    Assessment: Encounter Diagnoses  Name Primary?  . Type 2 diabetes mellitus with hyperglycemia, unspecified whether long term insulin use ([place]) Yes  . Essential hypertension, benign   . Aortic atherosclerosis ([place])   . OSA (obstructive sleep apnea)   . Morbid obesity ([place])   . Screen for colon cancer   . Hyperlipidemia, unspecified hyperlipidemia type   .  Microalbuminuria   . Anemia, unspecified type   . Vaccine counseling      Plan: Diabetes-he is tolerating the starter dose of Ozempic.  Go to the higher dose of Ozempic weekly.  Continue Jardiance and Metformin.  Continue glucose monitoring.  Obesity-counseled on diet, exercise, referral back to bariatric clinic.  He already went through most of the Program, but was lost to follow-up for surgery scheduling  He has already call [facility] about sleep study follow-up.  He is waiting to hear back from them on scheduling updated sleep study  Hypertension-continue current medication  Hyperlipidemia continue Crestor that he restarted last visit  Screen for colon cancer and anemia-continue plan to see gastroenterology in August  Echocardiogram from 06/13/2019 reviewed: Heart disease screening-EF 55-60%, normal LV function, no wall motion abnormalities, mild aortic valve sclerosis present  Vaccine counseling today  In 3 months we will see him back including labs for hemoglobin A1c, lipid, CMET  Patient Instructions  Let us know if you can't get in touch with [facility] about sleep study  Expect a call back about bariatric clinic appointment    Vaccines: As a diabetic and being over 50, we recommend the following vaccines:   We recommend a yearly flu shot  Shingles vaccine:  I recommend you have a shingles vaccine to help prevent shingles or herpes zoster outbreak.   Please call your insurer to inquire about coverage for the Shingrix vaccine given in 2 doses.   Some insurers cover this vaccine after age 48, some cover this after age 50.  If your insurer covers this, then call to schedule appointment to have this vaccine here.  We recommend the pneumococcal 23 pneumonia vaccine once  We recommend Covid vaccine  We recommend screening for hepatis and vaccination if not immune       Daimian was seen today for follow-up.  Diagnoses and all orders for this  visit:  Type 2 diabetes mellitus with hyperglycemia, unspecified whether long term insulin use (HCC)  Essential hypertension, benign -     carvedilol (COREG) 12.5 MG tablet; Take 1 tablet (12.5 mg total) by mouth 2 (two) times daily with a meal. -     losartan-hydrochlorothiazide (HYZAAR) 100-12.5 MG tablet; Take 1 tablet by mouth daily.  Aortic atherosclerosis (HCC)  OSA (obstructive sleep apnea)  Morbid obesity (HCC)  Screen for colon cancer  Hyperlipidemia, unspecified hyperlipidemia type  Microalbuminuria  Anemia, unspecified type  Vaccine counseling  Other orders -     Semaglutide, 1 MG/DOSE, (OZEMPIC, 1 MG/DOSE,) 2 MG/1.5ML SOPN; Inject 0.75 mLs (1 mg total) into the skin once a week.  f/u 37mo

## 2019-11-08 ENCOUNTER — Other Ambulatory Visit: Payer: Self-pay

## 2019-11-08 DIAGNOSIS — G4733 Obstructive sleep apnea (adult) (pediatric): Secondary | ICD-10-CM

## 2019-11-16 ENCOUNTER — Telehealth: Payer: Self-pay | Admitting: *Deleted

## 2019-11-16 NOTE — Telephone Encounter (Signed)
Dr. Carlean Purl patient was scheduled for a screening colon with you , but is 487 lbs and BMI 71.  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  Is diabetic and OSA.  Normal EF in Feb 55-60%

## 2019-11-16 NOTE — Telephone Encounter (Signed)
Patient's wife notified of the need for OV.  He is scheduled for 01/25/20 10:10. New patient letter sent

## 2019-11-16 NOTE — Telephone Encounter (Signed)
I would like to see him in the office to review this - the other reason was "anemia" which I think is most likely chronic disease anemia

## 2019-11-24 ENCOUNTER — Other Ambulatory Visit: Payer: Self-pay

## 2019-11-25 ENCOUNTER — Ambulatory Visit: Payer: 59

## 2019-12-02 ENCOUNTER — Other Ambulatory Visit: Payer: Self-pay | Admitting: Medical

## 2019-12-02 DIAGNOSIS — K219 Gastro-esophageal reflux disease without esophagitis: Secondary | ICD-10-CM

## 2019-12-08 ENCOUNTER — Ambulatory Visit: Payer: 59 | Admitting: Dietician

## 2019-12-10 ENCOUNTER — Encounter: Payer: 59 | Admitting: Internal Medicine

## 2019-12-15 ENCOUNTER — Encounter: Payer: Self-pay | Admitting: Dietician

## 2019-12-15 ENCOUNTER — Encounter: Payer: 59 | Attending: Medical | Admitting: Dietician

## 2019-12-15 ENCOUNTER — Other Ambulatory Visit: Payer: Self-pay

## 2019-12-15 DIAGNOSIS — E1165 Type 2 diabetes mellitus with hyperglycemia: Secondary | ICD-10-CM | POA: Diagnosis not present

## 2019-12-15 NOTE — Patient Instructions (Addendum)
Goals:   Check blood sugar 2 times a day. Once in the morning before you eat, and once 2 hours after you begin eating dinner. Take a log of your blood sugar readings to bring to our next appointment.  Fasting goal: Under 130 mg/dL  2 hours after a meal: under 180 mg/ dL   If you feel symptoms of a low, check blood sugar. If it is below 70, drink 4 oz of juice or 3-4 hard candies, wait 15 minutes, and recheck blood sugar. If it is still low, repeat the process.   Try to eat within an hour of waking up.   Aim to hit 4 carb choices per meal (60 g carbs), balance the meal with protein, and non starchy vegetables.   Try the orange juice that is fortified with Calcium and Vitamin D!   Keep up the good work!

## 2019-12-15 NOTE — Progress Notes (Signed)
Diabetes Self-Management Education  Visit Type: First/Initial  Appt. Start Time: 1400 Appt. End Time: 5188  12/15/2019  [name], identified by name and date of birth, is a 49 y.o. male with a diagnosis of Diabetes: Type 2.   ASSESSMENT  Pt reports changing his diet since the beginning of this year, and has lost 30 pounds.  Pt reports enjoying cooking. 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 Pt reports diarrhea from medication, but is not sure which one is responsible. Pt reports his daughter just graduated from [employer], and will be attending [employer] this fall for Sports Medicine. Pt considers his health fair because he was in a bad way in his past, but is now on the rebound. Pt reports a history of depression, and has recovered and is now living stress free. Pt has cut back all starches, choosing whole grains now. Pt is doing baked meats and rarely eats fried. Pt reports cutting out beef and pork.  Pt reports not eating after 5 pm anymore, and believes it has really helped a lot. Pt reports trying to stay active at work, is on his feet a lot. Pt reports his knee begins to give him problems after an hour or two. Pt uses a cane and a knee brace. Pt shows interest in getting back to the gym. Pt has a goal of losing about 75-100 pounds over the next year. Pt checks blood sugar fasting in the morning, and in the evening about 30 or more minutes after dinner. Pt reports Ozempic has made an amazing difference in his quality of life. Pt feels it has stopped his frequent urination at night. Pt enjoys cucumbers as a snack.   Height 5\' 9"  (1.753 m), weight (!) 475 lb 8 oz (215.7 kg). Body mass index is 70.22 kg/m.   Diabetes Self-Management Education - 12/15/19 1417      Visit Information   Visit Type First/Initial      Initial Visit   Diabetes Type Type 2    Are you currently following a meal plan? Yes    What type of meal plan do you  follow? Low starches    Are you taking your medications as prescribed? Yes    Date Diagnosed 2017      Health Coping   How would you rate your overall health? Fair      Psychosocial Assessment   Self-care barriers None    Self-management support Family   Wife   Patient Concerns Nutrition/Meal planning    Special Needs None    Preferred Learning Style No preference indicated    Learning Readiness Ready    How often do you need to have someone help you when you read instructions, pamphlets, or other written materials from your doctor or pharmacy? 1 - Never    What is the last grade level you completed in school? High school graduate      Pre-Education Assessment   Patient understands the diabetes disease and treatment process. Needs Review    Patient understands incorporating nutritional management into lifestyle. Needs Review    Patient undertands incorporating physical activity into lifestyle. Needs Review    Patient understands using medications safely. Needs Review    Patient understands monitoring blood glucose, interpreting and using results Needs Review    Patient understands prevention, detection, and treatment of acute complications. Needs Instruction    Patient understands prevention, detection, and treatment of chronic complications. Needs Instruction  Patient understands how to develop strategies to address psychosocial issues. Needs Review    Patient understands how to develop strategies to promote health/change behavior. Needs Review      Complications   Last HgB A1C per patient/outside source 7.9 %   09/20/2019   How often do you check your blood sugar? 1-2 times/day    Fasting Blood glucose range (mg/dL) 130-179    Postprandial Blood glucose range (mg/dL) 180-200    Number of hypoglycemic episodes per month 0    Number of hyperglycemic episodes per week 5    Can you tell when your blood sugar is high? Yes    What do you do if your blood sugar is high? Pt was  unaware of what to do. Symptoms have subsided since starting Ozempic.    Have you had a dilated eye exam in the past 12 months? No   Pt reports he has one upcoming   Have you had a dental exam in the past 12 months? No    Are you checking your feet? Yes    How many days per week are you checking your feet? 7      Dietary Intake   Breakfast none    Snack (morning) none    Lunch Grilled bologna and cheese, orange juice    Snack (afternoon) none    Dinner [employer] Baked Chicken, green beans, macaroni and cheese    Snack (evening) bowl of cucumbers with [place] dressing and vinegar    Beverage(s) Minutemaid juice, orange juice, water      Exercise   Exercise Type ADL's;Light (walking / raking leaves)    How many days per week to you exercise? 0    How many minutes per day do you exercise? 0    Total minutes per week of exercise 0      Patient Education   Previous Diabetes Education Yes (please comment)   Diabetes Core classes in 2016   Disease state  Explored patient's options for treatment of their diabetes;Factors that contribute to the development of diabetes    Nutrition management  Carbohydrate counting;Reviewed blood glucose goals for pre and post meals and how to evaluate the patients' food intake on their blood glucose level.;Role of diet in the treatment of diabetes and the relationship between the three main macronutrients and blood glucose level    Physical activity and exercise  Identified with patient nutritional and/or medication changes necessary with exercise.    Medications Reviewed patients medication for diabetes, action, purpose, timing of dose and side effects.    Monitoring Purpose and frequency of SMBG.;Taught/discussed recording of test results and interpretation of SMBG.    Acute complications Taught treatment of hypoglycemia - the 15 rule.;Discussed and identified patients' treatment of hyperglycemia.    Chronic complications Retinopathy and reason for yearly dilated eye  exams;Assessed and discussed foot care and prevention of foot problems    Psychosocial adjustment Role of stress on diabetes;Helped patient identify a support system for diabetes management    Personal strategies to promote health Lifestyle issues that need to be addressed for better diabetes care      Individualized Goals (developed by patient)   Nutrition Follow meal plan discussed;General guidelines for healthy choices and portions discussed    Physical Activity Exercise 1-2 times per week   As tolerated with knee pain   Medications take my medication as prescribed    Monitoring  test my blood glucose as discussed    Reducing Risk examine  blood glucose patterns;do foot checks daily      Post-Education Assessment   Patient understands the diabetes disease and treatment process. Needs Review    Patient understands incorporating nutritional management into lifestyle. Needs Review    Patient undertands incorporating physical activity into lifestyle. Needs Review    Patient understands using medications safely. Needs Review    Patient understands monitoring blood glucose, interpreting and using results Needs Review    Patient understands prevention, detection, and treatment of acute complications. Needs Review    Patient understands prevention, detection, and treatment of chronic complications. Needs Review    Patient understands how to develop strategies to address psychosocial issues. Needs Review    Patient understands how to develop strategies to promote health/change behavior. Needs Review      Outcomes   Expected Outcomes Demonstrated interest in learning. Expect positive outcomes    Future DMSE 4-6 wks    Program Status Not Completed           Individualized Plan for Diabetes Self-Management Training:   Learning Objective:  Patient will have a greater understanding of diabetes self-management. Patient education plan is to attend individual and/or group sessions per assessed  needs and concerns.   Plan:   Patient Instructions  Goals:   Check blood sugar 2 times a day. Once in the morning before you eat, and once 2 hours after you begin eating dinner. Take a log of your blood sugar readings to bring to our next appointment.  Fasting goal: Under 130 mg/dL  2 hours after a meal: under 180 mg/ dL   If you feel symptoms of a low, check blood sugar. If it is below 70, drink 4 oz of juice or 3-4 hard candies, wait 15 minutes, and recheck blood sugar. If it is still low, repeat the process.   Try to eat within an hour of waking up.   Aim to hit 4 carb choices per meal (60 g carbs), balance the meal with protein, and non starchy vegetables.   Try the orange juice that is fortified with Calcium and Vitamin D!   Keep up the good work!   Expected Outcomes:  Demonstrated interest in learning. Expect positive outcomes  Education material provided: ADA - How to Thrive: A Guide for Your Journey with Diabetes, Meal plan card and My Plate  If problems or questions, patient to contact team via:  Phone and Email  Future DSME appointment: 4-6 wks

## 2019-12-21 ENCOUNTER — Other Ambulatory Visit: Payer: Self-pay

## 2019-12-21 ENCOUNTER — Telehealth (INDEPENDENT_AMBULATORY_CARE_PROVIDER_SITE_OTHER): Payer: 59 | Admitting: Medical

## 2019-12-21 ENCOUNTER — Encounter: Payer: Self-pay | Admitting: Medical

## 2019-12-21 VITALS — Ht 69.0 in | Wt >= 6400 oz

## 2019-12-21 DIAGNOSIS — L744 Anhidrosis: Secondary | ICD-10-CM | POA: Diagnosis not present

## 2019-12-21 DIAGNOSIS — I700 Atherosclerosis of aorta: Secondary | ICD-10-CM

## 2019-12-21 DIAGNOSIS — E785 Hyperlipidemia, unspecified: Secondary | ICD-10-CM

## 2019-12-21 DIAGNOSIS — E1165 Type 2 diabetes mellitus with hyperglycemia: Secondary | ICD-10-CM

## 2019-12-21 DIAGNOSIS — I10 Essential (primary) hypertension: Secondary | ICD-10-CM

## 2019-12-21 NOTE — Progress Notes (Signed)
Subjective:     Patient ID: Christopher Skinner, male   DOB: 1970-12-13, 49 y.o.   MRN: [id number]  This visit type was conducted due to national recommendations for restrictions regarding the COVID-19 Pandemic (e.g. social distancing) in an effort to limit this patient's exposure and mitigate transmission in our community.  Due to their co-morbid illnesses, this patient is at least at moderate risk for complications without adequate follow up.  This format is felt to be most appropriate for this patient at this time.    Documentation for virtual audio and video telecommunications through [place] encounter:  The patient was located at home. The provider was located in the office. The patient did consent to this visit and is aware of possible charges through their insurance for this visit.  The other persons participating in this telemedicine service were none. Time spent on call was 20 minutes and in review of previous records 20 minutes total.  This virtual service is not related to other E/M service within previous 7 days.   HPI Chief Complaint  Patient presents with  . Consult    discuss not sweating    Virtual consult today.   He notes problems with not sweating.  Body feels warm or hot but not sweating.  Drinks a good amount of water and liquids in the day.  He is exercising some, walking.  He does not even sweat outside.  He feels fine otherwise.  No shortness of breath no chest pain no dizziness.  Checking glucose son.  His wife does not use her meter anymore so he is going to use hers.  He does start checking recently.  He is compliant with medications.  Review of Systems As in subjective      Objective:   Physical Exam Due to coronavirus pandemic stay at home measures, patient visit was virtual and they were not examined in person.   Ht 5\' 9"  (1.753 m)   Wt (!) 475 lb (215.5 kg)   BMI 70.15 kg/m       Assessment:     Encounter Diagnoses  Name Primary?  Mar[place] Kitchen  Anhidrosis Yes  . Essential hypertension, benign   . Aortic atherosclerosis ([place])   . Type 2 diabetes mellitus with hyperglycemia, unspecified whether long term insulin use ([place])   . Morbid obesity ([place])   . Hyperlipidemia, unspecified hyperlipidemia type        Plan:     We discussed symptoms and concerns.  He is consuming quite a bit of salt and not drinking a lot of water.  I advised he drink at least a gallon of water a day, limit salt.  We also discussed that some of his medicines could be playing a role with the lack of sweating.  He will come in for some labs to additionally evaluate this concern  Hypertension-continue carvedilol 12.5 mg twice daily, losartan HCT 100/12.5 mg daily  Diabetes- continue Metformin 850 mg twice daily, Ozempic weekly, continue Jardiance 10 mg daily.  Counseled on glucose monitoring.  Hyperlipidemia-continue Crestor 40 mg daily  Obesity work on efforts to lose weight through lifestyle changes  Return soon for fasting labs  Christopher Skinner was seen today for consult.  Diagnoses and all orders for this visit:  Anhidrosis -     Comprehensive metabolic panel; Future -     Hemoglobin A1c; Future -     TSH; Future -     Prolactin; Future -     Lipid panel; Future  Essential  hypertension, benign -     Comprehensive metabolic panel; Future -     TSH; Future  Aortic atherosclerosis (HCC)  Type 2 diabetes mellitus with hyperglycemia, unspecified whether long term insulin use (HCC) -     Comprehensive metabolic panel; Future -     Hemoglobin A1c; Future  Morbid obesity ([place])  Hyperlipidemia, unspecified hyperlipidemia type -     Comprehensive metabolic panel; Future -     Lipid panel; Future  f/u for fasting labs, nurse visit

## 2019-12-28 ENCOUNTER — Telehealth: Payer: Self-pay | Admitting: Family Medicine

## 2019-12-28 NOTE — Telephone Encounter (Signed)
She said that she did confirm that her insurance is still active.  They mentioned to her that his ID number is supposed to be different than hers so you may want to try [id number].

## 2019-12-28 NOTE — Telephone Encounter (Signed)
Patient's wife called asking if we have received approval for the patient to have gel injections for his knees? I did not see anything in his chart.  Please advise.

## 2019-12-28 NOTE — Telephone Encounter (Signed)
Faythe Ghee will run this tomorrow

## 2019-12-28 NOTE — Telephone Encounter (Signed)
Can you look into patients insurance. The insurance I have is [employer] ID [id number] and on the form it says that patients policy is in suspension period and considered inactive as of 09/27/2019. Medicaid does not cover Gel injections. Patient may need to call insurance to see what is going on.

## 2019-12-29 NOTE — Telephone Encounter (Signed)
Re-ran with new ID given awaiting reply from insurance

## 2019-12-30 ENCOUNTER — Telehealth: Payer: Self-pay | Admitting: Family Medicine

## 2019-12-30 NOTE — Telephone Encounter (Signed)
Pt wife called, he is really struggling with pain in his R knee. We are awaiting approval for gel injections from his insurance, is it too soon for him to have another cortisone injection in the interim?

## 2019-12-31 NOTE — Telephone Encounter (Signed)
Left message for patient to call back to schedule.  °

## 2019-12-31 NOTE — Telephone Encounter (Signed)
Can you schedule patient for an appointment for R knee injection while I await the approval of the Gel injection please?

## 2019-12-31 NOTE — Telephone Encounter (Signed)
He can schedule follow-up with me for steroid injections if he would like.

## 2020-01-04 NOTE — Telephone Encounter (Signed)
Appt made for 9/8.

## 2020-01-05 ENCOUNTER — Ambulatory Visit: Payer: 59 | Admitting: Family Medicine

## 2020-01-11 ENCOUNTER — Ambulatory Visit (INDEPENDENT_AMBULATORY_CARE_PROVIDER_SITE_OTHER): Payer: 59 | Admitting: Family Medicine

## 2020-01-11 ENCOUNTER — Other Ambulatory Visit: Payer: Self-pay

## 2020-01-11 ENCOUNTER — Ambulatory Visit: Payer: Self-pay

## 2020-01-11 ENCOUNTER — Encounter: Payer: Self-pay | Admitting: Family Medicine

## 2020-01-11 VITALS — BP 110/82 | HR 96 | Ht 69.0 in | Wt >= 6400 oz

## 2020-01-11 DIAGNOSIS — M25561 Pain in right knee: Secondary | ICD-10-CM

## 2020-01-11 NOTE — Progress Notes (Signed)
Rito Ehrlich, am serving as a Education administrator for Dr. Lynne Leader.  Christopher Skinner is a 49 y.o. male who presents to [place] at [facility] today for f/u of R knee pain.  He was last seen by Dr. Georgina Snell on 10/11/19.  He has a custom DJO knee brace which helps some in terms of pain w/ ambulation.  He had a R knee injection on 09/07/19.  We have attempted to get gel injections authorized for his R knee but these are not covered by his insurance.  Since his last visit, pt reports he has been wearing a brace all the time on his knee and states his knee has given out on him a couple times. Patient states about 2 weeks ago he was having a bad episode at work while wearing the brace. Patient wanting an injection.   He is also working on weight loss.  He has an appointment with his bariatric surgeon for consultation soon.  He is also working with a nutritionist now.  Diagnostic testing: R knee XR- 07/30/19, 07/01/19   Pertinent review of systems: No fevers or chills  Relevant historical information: Morbid obesity   Exam:  BP 110/82 (BP Location: Left Arm, Patient Position: Sitting, Cuff Size: Normal)   Pulse 96   Ht 5\' 9"  (1.753 m)   Wt (!) 487 lb (220.9 kg)   SpO2 99%   BMI 71.92 kg/m  General: Well Developed, well nourished, and in no acute distress.   MSK: Right knee difficult to tell effusion based on body habitus. Range of motion 0-100 degrees with crepitation. Stable ligamentous exam.    Lab and Radiology Results  Procedure: Real-time Ultrasound Guided Injection of right knee lateral superior patellar space Device: Philips Affiniti 50G Images permanently stored and available for review in PACS Verbal informed consent obtained.  Discussed risks and benefits of procedure. Warned about infection bleeding damage to structures skin hypopigmentation and fat atrophy among others. Patient expresses understanding and agreement Time-out conducted.   Noted no overlying  erythema, induration, or other signs of local infection.   Skin prepped in a sterile fashion.   Local anesthesia: Topical Ethyl chloride.   With sterile technique and under real time ultrasound guidance:  40 mg of Kenalog and 2 mL of Marcaine injected easily.   Completed without difficulty   Pain immediately resolved suggesting accurate placement of the medication.   Advised to call if fevers/chills, erythema, induration, drainage, or persistent bleeding.   Images permanently stored and available for review in the ultrasound unit.  Impression: Technically successful ultrasound guided injection.      Assessment and Plan: 49 y.o. male with right knee pain due to DJD.  Hyaluronic acid injections are no longer an option due to insurance.  We will proceed with repeated steroid injections.  Last injection was in May of this year.  Can do injections every 3 months.  However ultimately he will require total knee replacement and BMI needs to be less than 40 for that.  He is in the process of proceed with thoracic surgery which I think is his best option.  Recheck back with me as needed.    Orders Placed This Encounter  Procedures  . [place] LIMITED JOINT SPACE STRUCTURES LOW RIGHT(NO LINKED CHARGES)    Order Specific Question:   Reason for Exam (SYMPTOM  OR DIAGNOSIS REQUIRED)    Answer:   R knee pain    Order Specific Question:   Preferred imaging location?  Answer:   [place]   No orders of the defined types were placed in this encounter.    Discussed warning signs or symptoms. Please see discharge instructions. Patient expresses understanding.   The above documentation has been reviewed and is accurate and complete Lynne Leader, M.D.

## 2020-01-11 NOTE — Patient Instructions (Signed)
Thank you for coming in today. Call or go to the ER if you develop a large red swollen joint with extreme pain or oozing puss.  Weight loss will help more than anything else.  Agree with bariatric surgery.  Recheck with me as needed.

## 2020-01-13 ENCOUNTER — Ambulatory Visit: Payer: 59 | Admitting: Dietician

## 2020-01-18 ENCOUNTER — Encounter: Payer: Self-pay | Admitting: Medical

## 2020-01-18 ENCOUNTER — Other Ambulatory Visit: Payer: Self-pay

## 2020-01-18 ENCOUNTER — Telehealth (INDEPENDENT_AMBULATORY_CARE_PROVIDER_SITE_OTHER): Payer: 59 | Admitting: Medical

## 2020-01-18 VITALS — Ht 69.0 in | Wt >= 6400 oz

## 2020-01-18 DIAGNOSIS — H10022 Other mucopurulent conjunctivitis, left eye: Secondary | ICD-10-CM

## 2020-01-18 MED ORDER — POLYMYXIN B-TRIMETHOPRIM 10000-0.1 UNIT/ML-% OP SOLN: 1.0000 [drp] | OPHTHALMIC | 0 refills | Status: DC

## 2020-01-18 NOTE — Progress Notes (Signed)
   Subjective: Christopher Skinner is a 49 y.o. male who presents for possible pink eye.   Patient presents for evaluation of discharge, erythema, itching and tearing in the left eye.  They have noticed the above symptoms for 3 days.  Onset was gradual. Patient denies blurred vision, foreign body sensation and pain. There is a history of no exposure, no other aggravating or relieving factors.  No other c/o.  Past Medical History:  Diagnosis Date  . COVID-19 05/02/2019  . Diabetes mellitus without complication ([place]) 0177  . GERD (gastroesophageal reflux disease)   . H/O exercise stress test 2009   reportedly normal per patient  . Hyperlipidemia   . Hypertension 2008  . Microalbuminuria 2019  . Obesity   . Sleep apnea    last used CPAP 2014; quit on his own    ROS as in subjective   Objective: Ht 5\' 9"  (1.753 m)   Wt (!) 470 lb (213.2 kg)   BMI 69.41 kg/m   General appearance: alert, no distress Left eye: conjunctiva with erythema, mild crusting and watery discharge, no swelling, eyelids with crusting Right eye: conjunctiva normal, no discharge, no swelling, eyelids normal appearing       Assessment  Encounter Diagnosis  Name Primary?  . Pink eye, left Yes      Plan: Discussed diagnosis of conjunctivitis/pink eye.  Begin medication below.  Advised that pink eye is very contagious and spreads by direct contact.  Discussed treatment including moist warm compresses, antibiotic drops, avoid rubbing eyes, do not wear contact lenses or makeup until infection is resolved.  Discussed prevention, hand washing, not rubbing eyes.    Patient was advised to call or return if worse or not improving in the next few days.    Patient voiced understanding of diagnosis, recommendations, and treatment plan.   Christopher Skinner was seen today for conjunctivitis.  Diagnoses and all orders for this visit:  Pink eye, left  Other orders -     trimethoprim-polymyxin b (POLYTRIM) ophthalmic  solution; Place 1 drop into the left eye every 4 (four) hours.   F/u prn

## 2020-01-20 ENCOUNTER — Ambulatory Visit: Payer: 59 | Admitting: Dietician

## 2020-01-25 ENCOUNTER — Ambulatory Visit: Payer: 59 | Admitting: Internal Medicine

## 2020-01-26 ENCOUNTER — Ambulatory Visit: Payer: 59 | Admitting: Medical

## 2020-02-10 ENCOUNTER — Encounter: Payer: 59 | Attending: Medical | Admitting: Dietician

## 2020-02-10 DIAGNOSIS — E1165 Type 2 diabetes mellitus with hyperglycemia: Secondary | ICD-10-CM | POA: Insufficient documentation

## 2020-02-16 ENCOUNTER — Ambulatory Visit: Payer: 59 | Admitting: Medical

## 2020-02-16 LAB — HM DIABETES EYE EXAM

## 2020-02-17 ENCOUNTER — Encounter: Payer: Self-pay | Admitting: Medical

## 2020-03-13 ENCOUNTER — Encounter: Payer: Self-pay | Admitting: Internal Medicine

## 2020-03-13 ENCOUNTER — Ambulatory Visit (INDEPENDENT_AMBULATORY_CARE_PROVIDER_SITE_OTHER): Payer: 59 | Admitting: Internal Medicine

## 2020-03-13 ENCOUNTER — Other Ambulatory Visit (INDEPENDENT_AMBULATORY_CARE_PROVIDER_SITE_OTHER): Payer: 59

## 2020-03-13 VITALS — BP 128/74 | HR 93 | Ht 69.0 in | Wt >= 6400 oz

## 2020-03-13 DIAGNOSIS — D649 Anemia, unspecified: Secondary | ICD-10-CM

## 2020-03-13 DIAGNOSIS — Z1211 Encounter for screening for malignant neoplasm of colon: Secondary | ICD-10-CM

## 2020-03-13 LAB — CBC WITH DIFFERENTIAL/PLATELET
Basophils Absolute: 0 10*3/uL (ref 0.0–0.1)
Basophils Relative: 0.4 % (ref 0.0–3.0)
Eosinophils Absolute: 0.1 10*3/uL (ref 0.0–0.7)
Eosinophils Relative: 1.6 % (ref 0.0–5.0)
HCT: 39.8 % (ref 39.0–52.0)
Hemoglobin: 13.1 g/dL (ref 13.0–17.0)
Lymphocytes Relative: 29.2 % (ref 12.0–46.0)
Lymphs Abs: 1.7 10*3/uL (ref 0.7–4.0)
MCHC: 32.8 g/dL (ref 30.0–36.0)
MCV: 88.5 fl (ref 78.0–100.0)
Monocytes Absolute: 0.5 10*3/uL (ref 0.1–1.0)
Monocytes Relative: 8.9 % (ref 3.0–12.0)
Neutro Abs: 3.5 10*3/uL (ref 1.4–7.7)
Neutrophils Relative %: 59.9 % (ref 43.0–77.0)
Platelets: 360 10*3/uL (ref 150.0–400.0)
RBC: 4.49 Mil/uL (ref 4.22–5.81)
RDW: 15.8 % — ABNORMAL HIGH (ref 11.5–15.5)
WBC: 5.9 10*3/uL (ref 4.0–10.5)

## 2020-03-13 LAB — VITAMIN B12: Vitamin B-12: 318 pg/mL (ref 211–911)

## 2020-03-13 LAB — FERRITIN: Ferritin: 90.7 ng/mL (ref 22.0–322.0)

## 2020-03-13 MED ORDER — NA SULFATE-K SULFATE-MG SULF 17.5-3.13-1.6 GM/177ML PO SOLN: 1.0000 | Freq: Once | ORAL | 0 refills | Status: AC

## 2020-03-13 NOTE — Progress Notes (Signed)
Christopher Skinner 49 y.o. [birth year] [id number]  Assessment & Plan:   Encounter Diagnoses  Name Primary?  . Mild anemia Yes  . Colon cancer screening     Evaluated anemia with labs as below and it has resolved. Had arranged for EGD and colonoscopy given the anemia but he really only needs a screening colonoscopy so will amend orders such that he is only undergoing screening colonoscopy. SuPrep will be used as wife indicated she liked that for her colonoscopy.  The risks and benefits as well as alternatives of endoscopic procedure(s) have been discussed and reviewed. All questions answered. The patient agrees to proceed.   Lab Results  Component Value Date   WBC 5.9 03/13/2020   HGB 13.1 03/13/2020   HCT 39.8 03/13/2020   MCV 88.5 03/13/2020   PLT 360.0 03/13/2020   Lab Results  Component Value Date   FERRITIN 90.7 03/13/2020   Lab Results  Component Value Date   [id number] 703 03/13/2020    JK:[id number], [name], PA-C  Subjective:   Chief Complaint: colon cancer screening  HPI 49 yo bm with morbid obesity, mild anemia, htn, DM here to discuss screening colonoscopy exam. I requested he have an office visit due to chart review revealing a mild anemia w/ hgb in 11 range this year, normocytic. Iron level ok but no ferritin or B12. He denies any active GO sxs at this time.    Allergies  Allergen Reactions  . Shellfish Allergy Nausea And Vomiting   Current Meds  Medication Sig  . albuterol (PROVENTIL HFA;VENTOLIN HFA) 108 (90 Base) MCG/ACT inhaler Inhale 2 puffs into the lungs every 4 (four) hours as needed for wheezing or shortness of breath.  . blood glucose meter kit and supplies KIT Dispense based on patient and insurance preference. Use 1-2 times daily as directed. (FOR ICD-10 DE11.69).  . Blood Glucose Monitoring Suppl (TRUE METRIX METER) DEVI 1 Device by Does not apply route daily.  . carvedilol (COREG) 12.5 MG tablet Take 1 tablet (12.5 mg total) by mouth  2 (two) times daily with a meal.  . empagliflozin (JARDIANCE) 10 MG TABS tablet Take 10 mg by mouth daily before breakfast.  . glucose blood test strip True metrix strips to test 1-2 times daily  . Lancets MISC Use as instructed. Needs lancets based on pt's meter (true metrix)  . losartan-hydrochlorothiazide (HYZAAR) 100-12.5 MG tablet Take 1 tablet by mouth daily.  . metFORMIN (GLUCOPHAGE) 850 MG tablet Take 1 tablet (850 mg total) by mouth 2 (two) times daily with a meal.  . OZEMPIC, 1 MG/DOSE, 4 MG/3ML SOPN INJECT 0.75 ML (1 MG TOTAL) INTO THE SKIN ONCE A WEEK.  . RABEprazole (ACIPHEX) 20 MG tablet Take 1 tablet by mouth once daily  . rosuvastatin (CRESTOR) 40 MG tablet Take 1 tablet (40 mg total) by mouth at bedtime.  . vitamin B-12 (CYANOCOBALAMIN) 1000 MCG tablet Take 1 tablet (1,000 mcg total) by mouth daily.   Past Medical History:  Diagnosis Date  . COVID-19 05/02/2019  . Diabetes mellitus without complication ([place]) 9371  . GERD (gastroesophageal reflux disease)   . H/O exercise stress test 2009   reportedly normal per patient  . Hyperlipidemia   . Hypertension 2008  . Microalbuminuria 2019  . Obesity   . Sleep apnea    last used CPAP 2014; quit on his own   Past Surgical History:  Procedure Laterality Date  . CARPAL TUNNEL RELEASE     left  . COLONOSCOPY  2010   normal per patient  . HAND SURGERY     growth resection, right   Social History   Social History Narrative   Married, has 1 daughter, Christopher Skinner, owns catering business, has Quarry manager.   09/2018.     family history includes Hyperlipidemia in an other family member; Hypertension in his brother, brother, brother, brother, brother, brother, brother, brother, brother, brother, brother, brother, brother, brother, brother, brother, brother, father, mother, sister, sister, sister, sister, sister, sister, sister, sister, sister, and another family member; Obesity in an other family member; Sleep apnea in an other  family member; Stomach cancer in his brother; Stroke in his mother.   Review of Systems As above  Objective:   Physical Exam BP 128/74   Pulse 93   Ht _0  (1.753 m)   Wt (!) 461 lb (209.1 kg)   BMI 68.08 kg/m  Morbidly obese bm NAD Eyes anicteric Lungs cta Cor distant s1s2 no rmg Abdomen is morbidly obese Alert and oriented x 3

## 2020-03-13 NOTE — Patient Instructions (Signed)
Your provider has requested that you go to the basement level for lab work before leaving today. Press "B" on the elevator. The lab is located at the first door on the left as you exit the elevator.  Due to recent changes in healthcare laws, you may see the results of your imaging and laboratory studies on MyChart before your provider has had a chance to review them.  We understand that in some cases there may be results that are confusing or concerning to you. Not all laboratory results come back in the same time frame and the provider may be waiting for multiple results in order to interpret others.  Please give [place] 48 hours in order for your provider to thoroughly review all the results before contacting the office for clarification of your results.   You have been scheduled for an endoscopy and colonoscopy. Please follow the written instructions given to you at your visit today. Please pick up your prep supplies at the pharmacy within the next 1-3 days. If you use inhalers (even only as needed), please bring them with you on the day of your procedure.  I appreciate the opportunity to care for you. Silvano Rusk, MD, [facility]

## 2020-04-17 ENCOUNTER — Telehealth: Payer: Self-pay | Admitting: Internal Medicine

## 2020-04-17 NOTE — Progress Notes (Signed)
Spoke with pt and his wife whom stated they are going to need to rescheduled the procedure. They were to contact MD office for rescheduling.

## 2020-04-17 NOTE — Telephone Encounter (Signed)
Patients wife called to reschedule the procedure at the hospital

## 2020-04-17 NOTE — Telephone Encounter (Signed)
Patient has been rescheduled to 05/30/19.  He will need to arrive at 7:00 for an 8:30.  COVID screen has been rescheduled for 05/26/19.  Patient's wife is aware

## 2020-04-20 ENCOUNTER — Other Ambulatory Visit (HOSPITAL_COMMUNITY): Payer: 59

## 2020-04-24 ENCOUNTER — Other Ambulatory Visit (HOSPITAL_COMMUNITY): Payer: 59

## 2020-05-20 ENCOUNTER — Other Ambulatory Visit: Payer: Self-pay | Admitting: Medical

## 2020-05-20 DIAGNOSIS — K219 Gastro-esophageal reflux disease without esophagitis: Secondary | ICD-10-CM

## 2020-05-20 DIAGNOSIS — I10 Essential (primary) hypertension: Secondary | ICD-10-CM

## 2020-05-22 NOTE — Telephone Encounter (Signed)
Patient has appointment tomorrow

## 2020-05-23 ENCOUNTER — Ambulatory Visit: Payer: Self-pay | Admitting: Medical

## 2020-05-25 ENCOUNTER — Other Ambulatory Visit (HOSPITAL_COMMUNITY): Payer: 59

## 2020-05-26 ENCOUNTER — Telehealth: Payer: Self-pay | Admitting: Internal Medicine

## 2020-05-26 NOTE — Telephone Encounter (Signed)
Case was already cancelled by the hospital.

## 2020-05-26 NOTE — Telephone Encounter (Signed)
Pt's spouse is requesting a call back to cancel the pt's procedure scheduled at the hospital 1/31.

## 2020-05-26 NOTE — Progress Notes (Signed)
Another RN spoke to this patient . They stated they wanted to cancel this procedure and were advised to call Dr.Gessner's office to reschedule.

## 2020-05-29 ENCOUNTER — Encounter (HOSPITAL_COMMUNITY): Admission: RE | Payer: Self-pay | Source: Home / Self Care

## 2020-05-29 ENCOUNTER — Ambulatory Visit (HOSPITAL_COMMUNITY): Admission: RE | Admit: 2020-05-29 | Payer: 59 | Source: Home / Self Care | Admitting: Internal Medicine

## 2020-05-29 SURGERY — COLONOSCOPY WITH PROPOFOL
Anesthesia: Monitor Anesthesia Care

## 2020-06-01 ENCOUNTER — Other Ambulatory Visit: Payer: Self-pay

## 2020-06-01 DIAGNOSIS — K219 Gastro-esophageal reflux disease without esophagitis: Secondary | ICD-10-CM

## 2020-06-01 MED ORDER — RABEPRAZOLE SODIUM 20 MG PO TBEC: 20.0000 mg | DELAYED_RELEASE_TABLET | Freq: Every day | ORAL | 0 refills | Status: DC

## 2020-06-17 ENCOUNTER — Ambulatory Visit (HOSPITAL_COMMUNITY)
Admission: EM | Admit: 2020-06-17 | Discharge: 2020-06-17 | Disposition: A | Discharge status: Home / Self Care | Payer: 59 | Attending: Urgent Care | Admitting: Urgent Care

## 2020-06-17 ENCOUNTER — Other Ambulatory Visit: Payer: Self-pay

## 2020-06-17 ENCOUNTER — Encounter (HOSPITAL_COMMUNITY): Payer: Self-pay

## 2020-06-17 DIAGNOSIS — W57XXXA Bitten or stung by nonvenomous insect and other nonvenomous arthropods, initial encounter: Secondary | ICD-10-CM

## 2020-06-17 DIAGNOSIS — S0006XA Insect bite (nonvenomous) of scalp, initial encounter: Secondary | ICD-10-CM | POA: Diagnosis not present

## 2020-06-17 DIAGNOSIS — H539 Unspecified visual disturbance: Secondary | ICD-10-CM

## 2020-06-17 MED ORDER — MUPIROCIN 2 % EX OINT: 1.0000 "application " | TOPICAL_OINTMENT | Freq: Three times a day (TID) | CUTANEOUS | 0 refills | Status: DC

## 2020-06-17 NOTE — ED Provider Notes (Signed)
[place]   MRN: [id number] DOB: 1971-02-02  Subjective:   Christopher Skinner is a 50 y.o. male presenting for 3-day history of acute onset of posterior focal head pain from an insect bite.  Patient states that he felt an insect bite right over the back of his head on the right side.  He can't recall what type of insect it was and can't member seeing it but just felt it.  States that he has a scab over the area.  Denies fever, headache, confusion, weakness, numbness or tingling.  Denies history of stroke.  However, that is a primary concern as well.  Patient does have risk factors of diabetes, high blood pressure, hyperlipidemia.  States that he has had intermittent blurred vision of his right eye.  Denies any active vision change.  No current facility-administered medications for this encounter.  Current Outpatient Medications:  .  albuterol (PROVENTIL HFA;VENTOLIN HFA) 108 (90 Base) MCG/ACT inhaler, Inhale 2 puffs into the lungs every 4 (four) hours as needed for wheezing or shortness of breath., Disp: 1 Inhaler, Rfl: 0 .  blood glucose meter kit and supplies KIT, Dispense based on patient and insurance preference. Use 1-2 times daily as directed. (FOR ICD-10 DE11.69)., Disp: 1 each, Rfl: 0 .  Blood Glucose Monitoring Suppl (TRUE METRIX METER) DEVI, 1 Device by Does not apply route daily., Disp: 1 each, Rfl: 0 .  carvedilol (COREG) 12.5 MG tablet, Take 1 tablet (12.5 mg total) by mouth 2 (two) times daily with a meal., Disp: 180 tablet, Rfl: 1 .  empagliflozin (JARDIANCE) 10 MG TABS tablet, Take 10 mg by mouth daily before breakfast., Disp: 90 tablet, Rfl: 1 .  glucose blood test strip, True metrix strips to test 1-2 times daily, Disp: 100 each, Rfl: 12 .  Lancets MISC, Use as instructed. Needs lancets based on pt's meter (true metrix), Disp: 100 each, Rfl: 12 .  losartan-hydrochlorothiazide (HYZAAR) 100-12.5 MG tablet, Take 1 tablet by mouth daily., Disp: 90 tablet, Rfl:  3 .  metFORMIN (GLUCOPHAGE) 850 MG tablet, Take 1 tablet (850 mg total) by mouth 2 (two) times daily with a meal., Disp: 180 tablet, Rfl: 3 .  OZEMPIC, 1 MG/DOSE, 4 MG/3ML SOPN, INJECT 0.75 ML (1 MG TOTAL) INTO THE SKIN ONCE A WEEK., Disp: , Rfl:  .  RABEprazole (ACIPHEX) 20 MG tablet, Take 1 tablet (20 mg total) by mouth daily., Disp: 90 tablet, Rfl: 0 .  rosuvastatin (CRESTOR) 40 MG tablet, Take 1 tablet (40 mg total) by mouth at bedtime., Disp: 90 tablet, Rfl: 3 .  vitamin B-12 (CYANOCOBALAMIN) 1000 MCG tablet, Take 1 tablet (1,000 mcg total) by mouth daily., Disp: 90 tablet, Rfl: 3   Allergies  Allergen Reactions  . Shellfish Allergy Nausea And Vomiting    Past Medical History:  Diagnosis Date  . COVID-19 05/02/2019  . Diabetes mellitus without complication ([place]) 6789  . GERD (gastroesophageal reflux disease)   . H/O exercise stress test 2009   reportedly normal per patient  . Hyperlipidemia   . Hypertension 2008  . Microalbuminuria 2019  . Obesity   . Sleep apnea    last used CPAP 2014; quit on his own     Past Surgical History:  Procedure Laterality Date  . CARPAL TUNNEL RELEASE     left  . COLONOSCOPY  2010   normal per patient  . HAND SURGERY     growth resection, right    Family History  Problem Relation  Age of Onset  . Hyperlipidemia Other   . Hypertension Other   . Sleep apnea Other   . Obesity Other   . Stroke Mother   . Hypertension Mother   . Hypertension Father   . Hypertension Sister   . Hypertension Brother   . Hypertension Sister   . Hypertension Brother   . Hypertension Sister   . Hypertension Sister   . Hypertension Sister   . Hypertension Sister   . Hypertension Sister   . Hypertension Brother   . Hypertension Brother   . Stomach cancer Brother   . Hypertension Brother   . Hypertension Brother   . Hypertension Brother   . Hypertension Brother   . Hypertension Brother   . Hypertension Brother   . Hypertension Brother   .  Hypertension Brother   . Hypertension Brother   . Hypertension Brother   . Hypertension Brother   . Hypertension Brother   . Hypertension Brother   . Hypertension Sister   . Hypertension Sister   . Heart disease Neg Hx   . Cancer Neg Hx   . Diabetes Neg Hx   . Colon cancer Neg Hx     Social History   Tobacco Use  . Smoking status: Never Smoker  . Smokeless tobacco: Never Used  Vaping Use  . Vaping Use: Never used  Substance Use Topics  . Alcohol use: No  . Drug use: No    ROS   Objective:   Vitals: BP (!) 155/87 (BP Location: Left Arm)   Pulse 85   Temp 98.5 F (36.9 C) (Oral)   Resp 20   SpO2 95%   Physical Exam Constitutional:      General: He is not in acute distress.    Appearance: Normal appearance. He is well-developed. He is obese. He is not ill-appearing, toxic-appearing or diaphoretic.  HENT:     Head: Normocephalic and atraumatic.      Right Ear: External ear normal.     Left Ear: External ear normal.     Nose: Nose normal.     Mouth/Throat:     Pharynx: Oropharynx is clear.  Eyes:     General: No scleral icterus.       Right eye: No discharge.        Left eye: No discharge.     Extraocular Movements: Extraocular movements intact.     Conjunctiva/sclera: Conjunctivae normal.     Pupils: Pupils are equal, round, and reactive to light.  Cardiovascular:     Rate and Rhythm: Normal rate.  Pulmonary:     Effort: Pulmonary effort is normal.  Musculoskeletal:     Cervical back: Normal range of motion.  Neurological:     General: No focal deficit present.     Mental Status: He is alert and oriented to person, place, and time.     Cranial Nerves: No cranial nerve deficit.     Motor: No weakness or pronator drift.     Coordination: Romberg sign negative. Coordination normal.     Gait: Gait normal.     Deep Tendon Reflexes: Reflexes normal.     Comments: Ambulates at expected normal pace without assistance.  Psychiatric:        Mood and Affect:  Mood normal.        Behavior: Behavior normal.        Thought Content: Thought content normal.        Judgment: Judgment normal.     Assessment and  Plan :   PDMP not reviewed this encounter.  1. Insect bite of scalp, initial encounter   2. Vision changes     Will address patient's concern for an infected insect bite of the scalp with mupirocin.  At this time, patient has a normal neurologic exam but I explained to the patient that he can only have a rule out of a stroke in the emergency room.  Advised that I have a very low threshold to present to the ER should his symptoms change as discussed in clinic. Counseled patient on potential for adverse effects with medications prescribed/recommended today, ER and return-to-clinic precautions discussed, patient verbalized understanding.    Jaynee Eagles, PA-C 06/17/20 1052

## 2020-06-17 NOTE — ED Triage Notes (Signed)
Pt presents with insect bite on back of neck X 3 days.

## 2020-07-31 ENCOUNTER — Encounter: Payer: 59 | Admitting: Medical

## 2020-08-02 ENCOUNTER — Other Ambulatory Visit: Payer: Self-pay | Admitting: Medical

## 2020-08-02 DIAGNOSIS — K219 Gastro-esophageal reflux disease without esophagitis: Secondary | ICD-10-CM

## 2020-10-31 ENCOUNTER — Ambulatory Visit (INDEPENDENT_AMBULATORY_CARE_PROVIDER_SITE_OTHER): Payer: 59 | Admitting: Medical

## 2020-10-31 ENCOUNTER — Other Ambulatory Visit: Payer: Self-pay

## 2020-10-31 VITALS — BP 118/70 | HR 101 | Ht 69.5 in | Wt >= 6400 oz

## 2020-10-31 DIAGNOSIS — Z0000 Encounter for general adult medical examination without abnormal findings: Secondary | ICD-10-CM | POA: Insufficient documentation

## 2020-10-31 DIAGNOSIS — I700 Atherosclerosis of aorta: Secondary | ICD-10-CM | POA: Diagnosis not present

## 2020-10-31 DIAGNOSIS — E785 Hyperlipidemia, unspecified: Secondary | ICD-10-CM

## 2020-10-31 DIAGNOSIS — G4733 Obstructive sleep apnea (adult) (pediatric): Secondary | ICD-10-CM

## 2020-10-31 DIAGNOSIS — E1165 Type 2 diabetes mellitus with hyperglycemia: Secondary | ICD-10-CM | POA: Diagnosis not present

## 2020-10-31 DIAGNOSIS — Z2820 Immunization not carried out because of patient decision for unspecified reason: Secondary | ICD-10-CM

## 2020-10-31 DIAGNOSIS — D649 Anemia, unspecified: Secondary | ICD-10-CM | POA: Diagnosis not present

## 2020-10-31 DIAGNOSIS — M25561 Pain in right knee: Secondary | ICD-10-CM

## 2020-10-31 DIAGNOSIS — G8929 Other chronic pain: Secondary | ICD-10-CM

## 2020-10-31 DIAGNOSIS — I10 Essential (primary) hypertension: Secondary | ICD-10-CM | POA: Diagnosis not present

## 2020-10-31 DIAGNOSIS — K219 Gastro-esophageal reflux disease without esophagitis: Secondary | ICD-10-CM

## 2020-10-31 DIAGNOSIS — R160 Hepatomegaly, not elsewhere classified: Secondary | ICD-10-CM

## 2020-10-31 DIAGNOSIS — R809 Proteinuria, unspecified: Secondary | ICD-10-CM

## 2020-10-31 DIAGNOSIS — Z1211 Encounter for screening for malignant neoplasm of colon: Secondary | ICD-10-CM

## 2020-10-31 DIAGNOSIS — Z7185 Encounter for immunization safety counseling: Secondary | ICD-10-CM

## 2020-10-31 DIAGNOSIS — Z125 Encounter for screening for malignant neoplasm of prostate: Secondary | ICD-10-CM

## 2020-10-31 LAB — POCT URINALYSIS DIP (PROADVANTAGE DEVICE)
Bilirubin, UA: NEGATIVE
Blood, UA: NEGATIVE
Glucose, UA: 500 mg/dL — AB
Ketones, POC UA: NEGATIVE mg/dL
Leukocytes, UA: NEGATIVE
Nitrite, UA: NEGATIVE
Specific Gravity, Urine: 1.02
Urobilinogen, Ur: NEGATIVE
pH, UA: 6 (ref 5.0–8.0)

## 2020-10-31 NOTE — Patient Instructions (Signed)
This visit was a preventative care visit, also known as wellness visit or routine physical.   Topics typically include healthy lifestyle, diet, exercise, preventative care, vaccinations, sick and well care, proper use of emergency dept and after hours care, as well as other concerns.     Recommendations: Continue to return yearly for your annual wellness and preventative care visits.  This gives [place] a chance to discuss healthy lifestyle, exercise, vaccinations, review your chart record, and perform screenings where appropriate.  I recommend you see your eye doctor yearly for routine vision care.  I recommend you see your dentist yearly for routine dental care including hygiene visits twice yearly.   Vaccination recommendations were reviewed Immunization History  Administered Date(s) Administered   Tdap 10/05/2018    You declined additional vaccines today  Given your age and health issues, it is recommended you have a shingles vaccine, pneumococcal vaccine, yearly flu shot in the fall and COVID booster   Screening for cancer: Colon cancer screening: We will work to get you rescheduled for colonoscopy given the issues with scheduling last year  We discussed PSA, prostate exam, and prostate cancer screening risks/benefits.     Skin cancer screening: Check your skin regularly for new changes, growing lesions, or other lesions of concern Come in for evaluation if you have skin lesions of concern.  Lung cancer screening: If you have a greater than 20 pack year history of tobacco use, then you may qualify for lung cancer screening with a chest CT scan.   Please call your insurance company to inquire about coverage for this test.  We currently don't have screenings for other cancers besides breast, cervical, colon, and lung cancers.  If you have a strong family history of cancer or have other cancer screening concerns, please let me know.    Bone health: Get at least 150 minutes of  aerobic exercise weekly Get weight bearing exercise at least once weekly Bone density test:  A bone density test is an imaging test that uses a type of X-ray to measure the amount of calcium and other minerals in your bones. The test may be used to diagnose or screen you for a condition that causes weak or thin bones (osteoporosis), predict your risk for a broken bone (fracture), or determine how well your osteoporosis treatment is working. The bone density test is recommended for females 13 and older, or females or males <30 if certain risk factors such as thyroid disease, long term use of steroids such as for asthma or rheumatological issues, vitamin D deficiency, estrogen deficiency, family history of osteoporosis, self or family history of fragility fracture in first degree relative.    Heart health: Get at least 150 minutes of aerobic exercise weekly Limit alcohol It is important to maintain a healthy blood pressure and healthy cholesterol numbers  Heart disease screening: Screening for heart disease includes screening for blood pressure, fasting lipids, glucose/diabetes screening, BMI height to weight ratio, reviewed of smoking status, physical activity, and diet.    Goals include blood pressure 120/80 or less, maintaining a healthy lipid/cholesterol profile, preventing diabetes or keeping diabetes numbers under good control, not smoking or using tobacco products, exercising most days per week or at least 150 minutes per week of exercise, and eating healthy variety of fruits and vegetables, healthy oils, and avoiding unhealthy food choices like fried food, fast food, high sugar and high cholesterol foods.    Other tests may possibly include EKG test, CT coronary calcium score, echocardiogram,  exercise treadmill stress test.   I reviewed the 2021 echocardiogram in the chart record    Medical care options: I recommend you continue to seek care here first for routine care.  We try really  hard to have available appointments Monday through Friday daytime hours for sick visits, acute visits, and physicals.  Urgent care should be used for after hours and weekends for significant issues that cannot wait till the next day.  The emergency department should be used for significant potentially life-threatening emergencies.  The emergency department is expensive, can often have long wait times for less significant concerns, so try to utilize primary care, urgent care, or telemedicine when possible to avoid unnecessary trips to the emergency department.  Virtual visits and telemedicine have been introduced since the pandemic started in 2020, and can be convenient ways to receive medical care.  We offer virtual appointments as well to assist you in a variety of options to seek medical care.    Separate significant issues discussed: Hypertension-continue current medication, routine labs today  Hyperlipidemia-continue current medication, labs today fasting  Diabetes-discussed the need for glucose monitoring, regular follow-up, more frequent follow-up.  Labs today.  Continue current medications  Obesity-congratulated him on his weight loss since last visit.  Continue efforts to lose weight through regular exercise and healthy eating  OSA-noncompliant with CPAP.  Continue efforts to lose weight  GERD-no complaints, continues on PPI as this helps.  Hopefully additional weight loss will help symptoms as well to potentially be able to come off medication in the future

## 2020-10-31 NOTE — Progress Notes (Signed)
Subjective:   HPI  Christopher Skinner is a 50 y.o. male who presents for Chief Complaint  Patient presents with   fasting cpe    Fasting cpe, no concerns     Patient Care Team: Fayelynn Distel, Leward Quan as PCP - General (Family Medicine) Sees dentist Sees eye doctor, Dr. Katy Fitch   Concerns: Diabetes-checks blood sugars a few days per week.  He denies any recent low or high readings.  He does not give me any specific numbers today.  No foot concerns.  No polyuria, polydipsia, blurred vision.  He has intentionally lost weight since last visit.  He is compliant with Jardiance 10 mg daily, metformin 850 mg twice daily, Ozempic weekly injection  Hyperlipidemia-compliant with rosuvastatin Crestor without complaint  Hypertension-compliant with losartan HCT 100/12.5 mg daily, carvedilol 12.5 mg twice daily  GERD-he continues on Aciphex.  No recent concerns  He notes that he has been trying to eat healthier, getting some exercise, has lost weight since last visit.  Since last year we had referred to gastroenterology.  He has seen Dr. Carlean Purl for consult but then Dr. Carlean Purl had a personal emergency and he was lost to follow-up on scheduling for colonoscopy  Reviewed their medical, surgical, family, social, medication, and allergy history and updated chart as appropriate.  Past Medical History:  Diagnosis Date   COVID-19 05/02/2019   Diabetes mellitus without complication ([place]) 3419   GERD (gastroesophageal reflux disease)    H/O exercise stress test 2009   reportedly normal per patient   Hyperlipidemia    Hypertension 2008   Microalbuminuria 2019   Obesity    Sleep apnea    last used CPAP 2014; quit on his own    Past Surgical History:  Procedure Laterality Date   CARPAL TUNNEL RELEASE     left   COLONOSCOPY  2010   normal per patient   HAND SURGERY     growth resection, right    Family History  Problem Relation Age of Onset   Hyperlipidemia Other    Hypertension  Other    Sleep apnea Other    Obesity Other    Stroke Mother    Hypertension Mother    Hypertension Father    Hypertension Sister    Hypertension Brother    Hypertension Sister    Hypertension Brother    Hypertension Sister    Hypertension Sister    Hypertension Sister    Hypertension Sister    Hypertension Sister    Hypertension Brother    Hypertension Brother    Stomach cancer Brother    Hypertension Brother    Hypertension Brother    Hypertension Brother    Hypertension Brother    Hypertension Brother    Hypertension Brother    Hypertension Brother    Hypertension Brother    Hypertension Brother    Hypertension Brother    Hypertension Brother    Hypertension Brother    Hypertension Brother    Hypertension Sister    Hypertension Sister    Heart disease Neg Hx    Cancer Neg Hx    Diabetes Neg Hx    Colon cancer Neg Hx      Current Outpatient Medications:    blood glucose meter kit and supplies KIT, Dispense based on patient and insurance preference. Use 1-2 times daily as directed. (FOR ICD-10 DE11.69)., Disp: 1 each, Rfl: 0   Blood Glucose Monitoring Suppl (TRUE METRIX METER) DEVI, 1 Device by Does not apply route daily.,  Disp: 1 each, Rfl: 0   carvedilol (COREG) 12.5 MG tablet, Take 1 tablet (12.5 mg total) by mouth 2 (two) times daily with a meal., Disp: 180 tablet, Rfl: 1   glucose blood test strip, True metrix strips to test 1-2 times daily, Disp: 100 each, Rfl: 12   JARDIANCE 10 MG TABS tablet, TAKE 1 TABLET BY MOUTH ONCE DAILY BEFORE BREAKFAST, Disp: 30 tablet, Rfl: 0   Lancets MISC, Use as instructed. Needs lancets based on pt's meter (true metrix), Disp: 100 each, Rfl: 12   losartan-hydrochlorothiazide (HYZAAR) 100-12.5 MG tablet, Take 1 tablet by mouth daily., Disp: 90 tablet, Rfl: 3   metFORMIN (GLUCOPHAGE) 850 MG tablet, Take 1 tablet (850 mg total) by mouth 2 (two) times daily with a meal., Disp: 180 tablet, Rfl: 3   mupirocin ointment (BACTROBAN) 2 %,  Apply 1 application topically 3 (three) times daily., Disp: 30 g, Rfl: 0   OZEMPIC, 1 MG/DOSE, 4 MG/3ML SOPN, INJECT 0.75 ML (1 MG TOTAL) INTO THE SKIN ONCE A WEEK., Disp: , Rfl:    RABEprazole (ACIPHEX) 20 MG tablet, Take 1 tablet by mouth once daily, Disp: 30 tablet, Rfl: 0   vitamin B-12 (CYANOCOBALAMIN) 1000 MCG tablet, Take 1 tablet (1,000 mcg total) by mouth daily., Disp: 90 tablet, Rfl: 3   albuterol (PROVENTIL HFA;VENTOLIN HFA) 108 (90 Base) MCG/ACT inhaler, Inhale 2 puffs into the lungs every 4 (four) hours as needed for wheezing or shortness of breath. (Patient not taking: Reported on 10/31/2020), Disp: 1 Inhaler, Rfl: 0   rosuvastatin (CRESTOR) 40 MG tablet, Take 1 tablet (40 mg total) by mouth at bedtime., Disp: 90 tablet, Rfl: 3  Allergies  Allergen Reactions   Shellfish Allergy Nausea And Vomiting     Review of Systems Constitutional: -fever, -chills, -sweats, -unexpected weight change, -decreased appetite, -fatigue Allergy: -sneezing, -itching, -congestion Dermatology: -changing moles, --rash, -lumps ENT: -runny nose, -ear pain, -sore throat, -hoarseness, -sinus pain, -teeth pain, - ringing in ears, -hearing loss, -nosebleeds Cardiology: -chest pain, -palpitations, -swelling, -difficulty breathing when lying flat, -waking up short of breath Respiratory: -cough, -shortness of breath, -difficulty breathing with exercise or exertion, -wheezing, -coughing up blood Gastroenterology: -abdominal pain, -nausea, -vomiting, -diarrhea, -constipation, -blood in stool, -changes in bowel movement, -difficulty swallowing or eating Hematology: -bleeding, -bruising  Musculoskeletal: -joint aches, -muscle aches, -joint swelling, -back pain, -neck pain, -cramping, -changes in gait Ophthalmology: denies vision changes, eye redness, itching, discharge Urology: -burning with urination, -difficulty urinating, -blood in urine, -urinary frequency, -urgency, -incontinence Neurology: -headache, -weakness,  -tingling, -numbness, -memory loss, -falls, -dizziness Psychology: -depressed mood, -agitation, -sleep problems Male GU: no testicular mass, pain, no lymph nodes swollen, no swelling, no rash.  Depression screen [facility] 2/9 10/31/2020 12/15/2019 10/05/2018 07/12/2014  Decreased Interest 0 0 0 0  Down, Depressed, Hopeless 0 0 1 1  PHQ - 2 Score 0 0 1 1        Objective:  BP 118/70   Pulse (!) 101   Ht 5' 9.5" (1.765 m)   Wt (!) 459 lb 9.6 oz (208.5 kg)   BMI 66.90 kg/m   General appearance: alert, no distress, WD/WN, morbidly obese African American male Skin: dry skin, no worrisome lesions HEENT: normocephalic, conjunctiva/corneas normal, sclerae anicteric, PERRLA, EOMi, nares patent, no discharge or erythema, pharynx normal Neck: supple, no lymphadenopathy, no thyromegaly, no masses, normal ROM, no bruits Chest: non tender, normal shape and expansion Heart: RRR, normal S1, S2, no murmurs Lungs: CTA bilaterally, no wheezes, rhonchi, or rales  Abdomen: +bs, soft, non tender, non distended, no masses, no hepatomegaly, no splenomegaly, no bruits Back: non tender, normal ROM, no scoliosis Musculoskeletal: upper extremities non tender, no obvious deformity, normal ROM throughout, lower extremities non tender, no obvious deformity, normal ROM throughout Extremities: no edema, no cyanosis, no clubbing Pulses: 2+ symmetric, upper and lower extremities, normal cap refill Neurological: alert, oriented x 3, CN2-12 intact, strength normal upper extremities and lower extremities, sensation normal throughout, DTRs 2+ throughout, no cerebellar signs, gait normal Psychiatric: normal affect, behavior normal, pleasant  GU/rectal - declined/deferred   Diabetic Foot Exam - Simple   Simple Foot Form Diabetic Foot exam was performed with the following findings: Yes 10/31/2020  1:32 PM  Visual Inspection See comments: Yes Sensation Testing Intact to touch and monofilament testing bilaterally: Yes Pulse  Check Posterior Tibialis and Dorsalis pulse intact bilaterally: Yes Comments Flat feet, healing small wound/abrasion of distal anterior left leg, mostly healed.  No worrisome findings      Assessment and Plan :   Encounter Diagnoses  Name Primary?   Encounter for health maintenance examination in adult Yes   Vaccine counseling    Type 2 diabetes mellitus with hyperglycemia, unspecified whether long term insulin use (HCC)    Aortic atherosclerosis (HCC)    Anemia, unspecified type    Essential hypertension, benign    Chronic pain of right knee    Gastroesophageal reflux disease, unspecified whether esophagitis present    Hyperlipidemia, unspecified hyperlipidemia type    Hepatomegaly    Microalbuminuria    Morbid obesity (HCC)    OSA (obstructive sleep apnea)    Screen for colon cancer    Screening for prostate cancer    Vaccine refused by patient     This visit was a preventative care visit, also known as wellness visit or routine physical.   Topics typically include healthy lifestyle, diet, exercise, preventative care, vaccinations, sick and well care, proper use of emergency dept and after hours care, as well as other concerns.     Recommendations: Continue to return yearly for your annual wellness and preventative care visits.  This gives [place] a chance to discuss healthy lifestyle, exercise, vaccinations, review your chart record, and perform screenings where appropriate.  I recommend you see your eye doctor yearly for routine vision care.  I recommend you see your dentist yearly for routine dental care including hygiene visits twice yearly.   Vaccination recommendations were reviewed Immunization History  Administered Date(s) Administered   Tdap 10/05/2018    You declined additional vaccines today  Given your age and health issues, it is recommended you have a shingles vaccine, pneumococcal vaccine, yearly flu shot in the fall and COVID booster   Screening for  cancer: Colon cancer screening: We will work to get you rescheduled for colonoscopy given the issues with scheduling last year  We discussed PSA, prostate exam, and prostate cancer screening risks/benefits.     Skin cancer screening: Check your skin regularly for new changes, growing lesions, or other lesions of concern Come in for evaluation if you have skin lesions of concern.  Lung cancer screening: If you have a greater than 20 pack year history of tobacco use, then you may qualify for lung cancer screening with a chest CT scan.   Please call your insurance company to inquire about coverage for this test.  We currently don't have screenings for other cancers besides breast, cervical, colon, and lung cancers.  If you have a strong family history of  cancer or have other cancer screening concerns, please let me know.    Bone health: Get at least 150 minutes of aerobic exercise weekly Get weight bearing exercise at least once weekly Bone density test:  A bone density test is an imaging test that uses a type of X-ray to measure the amount of calcium and other minerals in your bones. The test may be used to diagnose or screen you for a condition that causes weak or thin bones (osteoporosis), predict your risk for a broken bone (fracture), or determine how well your osteoporosis treatment is working. The bone density test is recommended for females 67 and older, or females or males <63 if certain risk factors such as thyroid disease, long term use of steroids such as for asthma or rheumatological issues, vitamin D deficiency, estrogen deficiency, family history of osteoporosis, self or family history of fragility fracture in first degree relative.    Heart health: Get at least 150 minutes of aerobic exercise weekly Limit alcohol It is important to maintain a healthy blood pressure and healthy cholesterol numbers  Heart disease screening: Screening for heart disease includes screening for  blood pressure, fasting lipids, glucose/diabetes screening, BMI height to weight ratio, reviewed of smoking status, physical activity, and diet.    Goals include blood pressure 120/80 or less, maintaining a healthy lipid/cholesterol profile, preventing diabetes or keeping diabetes numbers under good control, not smoking or using tobacco products, exercising most days per week or at least 150 minutes per week of exercise, and eating healthy variety of fruits and vegetables, healthy oils, and avoiding unhealthy food choices like fried food, fast food, high sugar and high cholesterol foods.    Other tests may possibly include EKG test, CT coronary calcium score, echocardiogram, exercise treadmill stress test.   I reviewed the 2021 echocardiogram in the chart record    Medical care options: I recommend you continue to seek care here first for routine care.  We try really hard to have available appointments Monday through Friday daytime hours for sick visits, acute visits, and physicals.  Urgent care should be used for after hours and weekends for significant issues that cannot wait till the next day.  The emergency department should be used for significant potentially life-threatening emergencies.  The emergency department is expensive, can often have long wait times for less significant concerns, so try to utilize primary care, urgent care, or telemedicine when possible to avoid unnecessary trips to the emergency department.  Virtual visits and telemedicine have been introduced since the pandemic started in 2020, and can be convenient ways to receive medical care.  We offer virtual appointments as well to assist you in a variety of options to seek medical care.    Separate significant issues discussed: Hypertension-continue current medication, routine labs today  Hyperlipidemia-continue current medication, labs today fasting  Diabetes-discussed the need for glucose monitoring, regular follow-up,  more frequent follow-up.  Labs today.  Continue current medications  Obesity-congratulated him on his weight loss since last visit.  Continue efforts to lose weight through regular exercise and healthy eating  OSA-noncompliant with CPAP.  Continue efforts to lose weight  GERD-no complaints, continues on PPI as this helps.  Hopefully additional weight loss will help symptoms as well to potentially be able to come off medication in the future   Aurthur was seen today for fasting cpe.  Diagnoses and all orders for this visit:  Encounter for health maintenance examination in adult -     Comprehensive metabolic  panel -     CBC -     Lipid panel -     PSA -     Microalbumin/Creatinine Ratio, Urine -     TSH -     Hemoglobin A1c -     POCT Urinalysis DIP (Proadvantage Device)  Vaccine counseling  Type 2 diabetes mellitus with hyperglycemia, unspecified whether long term insulin use (HCC) -     Microalbumin/Creatinine Ratio, Urine -     Hemoglobin A1c -     POCT Urinalysis DIP (Proadvantage Device)  Aortic atherosclerosis (HCC) -     Lipid panel  Anemia, unspecified type  Essential hypertension, benign  Chronic pain of right knee  Gastroesophageal reflux disease, unspecified whether esophagitis present  Hyperlipidemia, unspecified hyperlipidemia type -     Lipid panel  Hepatomegaly -     Comprehensive metabolic panel -     CBC  Microalbuminuria  Morbid obesity (HCC) -     TSH  OSA (obstructive sleep apnea)  Screen for colon cancer  Screening for prostate cancer -     PSA  Vaccine refused by patient   Follow-up pending labs, yearly for physical

## 2020-11-01 ENCOUNTER — Other Ambulatory Visit: Payer: Self-pay | Admitting: Medical

## 2020-11-01 DIAGNOSIS — E1165 Type 2 diabetes mellitus with hyperglycemia: Secondary | ICD-10-CM

## 2020-11-01 DIAGNOSIS — K219 Gastro-esophageal reflux disease without esophagitis: Secondary | ICD-10-CM

## 2020-11-01 DIAGNOSIS — I10 Essential (primary) hypertension: Secondary | ICD-10-CM

## 2020-11-01 LAB — COMPREHENSIVE METABOLIC PANEL
ALT: 24 IU/L (ref 0–44)
AST: 21 IU/L (ref 0–40)
Albumin/Globulin Ratio: 1.2 (ref 1.2–2.2)
Albumin: 4.4 g/dL (ref 3.8–4.9)
Alkaline Phosphatase: 83 IU/L (ref 44–121)
BUN/Creatinine Ratio: 16 (ref 9–20)
BUN: 22 mg/dL (ref 6–24)
Bilirubin Total: 0.2 mg/dL (ref 0.0–1.2)
CO2: 16 mmol/L — ABNORMAL LOW (ref 20–29)
Calcium: 9.6 mg/dL (ref 8.7–10.2)
Chloride: 104 mmol/L (ref 96–106)
Creatinine, Ser: 1.34 mg/dL — ABNORMAL HIGH (ref 0.76–1.27)
Globulin, Total: 3.6 g/dL (ref 1.5–4.5)
Glucose: 108 mg/dL — ABNORMAL HIGH (ref 65–99)
Potassium: 4.9 mmol/L (ref 3.5–5.2)
Sodium: 140 mmol/L (ref 134–144)
Total Protein: 8 g/dL (ref 6.0–8.5)
eGFR: 64 mL/min/{1.73_m2} (ref >59)

## 2020-11-01 LAB — LIPID PANEL
Chol/HDL Ratio: 4.9 ratio (ref 0.0–5.0)
Cholesterol, Total: 214 mg/dL — ABNORMAL HIGH (ref 100–199)
HDL: 44 mg/dL (ref >39)
LDL Chol Calc (NIH): 148 mg/dL — ABNORMAL HIGH (ref 0–99)
Triglycerides: 122 mg/dL (ref 0–149)
VLDL Cholesterol Cal: 22 mg/dL (ref 5–40)

## 2020-11-01 LAB — HEMOGLOBIN A1C
Est. average glucose Bld gHb Est-mCnc: 146 mg/dL
Hgb A1c MFr Bld: 6.7 % — ABNORMAL HIGH (ref 4.8–5.6)

## 2020-11-01 LAB — TSH: TSH: 2.47 u[IU]/mL (ref 0.450–4.500)

## 2020-11-01 LAB — CBC
Hematocrit: 37.7 % (ref 37.5–51.0)
Hemoglobin: 12.5 g/dL — ABNORMAL LOW (ref 13.0–17.7)
MCH: 28.2 pg (ref 26.6–33.0)
MCHC: 33.2 g/dL (ref 31.5–35.7)
MCV: 85 fL (ref 79–97)
Platelets: 348 10*3/uL (ref 150–450)
RBC: 4.43 x10E6/uL (ref 4.14–5.80)
RDW: 14.8 % (ref 11.6–15.4)
WBC: 5.8 10*3/uL (ref 3.4–10.8)

## 2020-11-01 LAB — PSA: Prostate Specific Ag, Serum: 0.1 ng/mL (ref 0.0–4.0)

## 2020-11-01 LAB — MICROALBUMIN / CREATININE URINE RATIO
Creatinine, Urine: 130.8 mg/dL
Microalb/Creat Ratio: 24 mg/g creat (ref 0–29)
Microalbumin, Urine: 31.8 ug/mL

## 2020-11-01 MED ORDER — RABEPRAZOLE SODIUM 20 MG PO TBEC: 20.0000 mg | DELAYED_RELEASE_TABLET | Freq: Every day | ORAL | 3 refills | Status: DC

## 2020-11-01 MED ORDER — OZEMPIC (2 MG/DOSE) 8 MG/3ML ~~LOC~~ SOPN: 2.0000 mg | PEN_INJECTOR | SUBCUTANEOUS | 5 refills | Status: DC

## 2020-11-01 MED ORDER — CARVEDILOL 12.5 MG PO TABS: 12.5000 mg | ORAL_TABLET | Freq: Two times a day (BID) | ORAL | 3 refills | Status: DC

## 2020-11-01 MED ORDER — EMPAGLIFLOZIN 25 MG PO TABS: 25.0000 mg | ORAL_TABLET | Freq: Every day | ORAL | 3 refills | Status: DC

## 2020-11-01 MED ORDER — VITAMIN B-12 1000 MCG PO TABS: 1000.0000 ug | ORAL_TABLET | Freq: Every day | ORAL | 3 refills | Status: DC

## 2020-11-01 MED ORDER — METFORMIN HCL 850 MG PO TABS: 850.0000 mg | ORAL_TABLET | Freq: Two times a day (BID) | ORAL | 3 refills | Status: DC

## 2020-11-01 MED ORDER — ATORVASTATIN CALCIUM 80 MG PO TABS: 80.0000 mg | ORAL_TABLET | Freq: Every day | ORAL | 0 refills | Status: DC

## 2020-11-01 MED ORDER — LOSARTAN POTASSIUM-HCTZ 100-12.5 MG PO TABS: 1.0000 | ORAL_TABLET | Freq: Every day | ORAL | 3 refills | Status: DC

## 2020-11-02 ENCOUNTER — Telehealth: Payer: Self-pay

## 2020-11-02 ENCOUNTER — Encounter: Payer: Self-pay | Admitting: Internal Medicine

## 2020-11-02 ENCOUNTER — Other Ambulatory Visit: Payer: Self-pay | Admitting: Internal Medicine

## 2020-11-02 DIAGNOSIS — Z1211 Encounter for screening for malignant neoplasm of colon: Secondary | ICD-10-CM

## 2020-11-02 NOTE — Telephone Encounter (Signed)
I spoke with Christopher Skinner and we have set him up per Dr Carlean Purl for a screening colonoscopy at [facility] for 12/18/20 at 7:30am. His pre-visit appointment is 12/05/20 at 1:00pm. We will mail him paperwork for his upcoming appointments.

## 2020-12-04 ENCOUNTER — Telehealth: Payer: Self-pay | Admitting: Internal Medicine

## 2020-12-04 NOTE — Telephone Encounter (Signed)
I tried to call his wife Tyrell back and her mail box is full and cannot accept any messages. I will try later.

## 2020-12-04 NOTE — Telephone Encounter (Signed)
I called and spoke with Christopher Skinner and Christopher Skinner. His colonoscopy has been changed to 02/08/21 8:30am at [facility]. And the pre-visit appointment is 01/22/21 at 2:30pm to get his instructions.

## 2020-12-04 NOTE — Telephone Encounter (Signed)
Christopher Skinner, can you please assist patient is rescheduling his procedure

## 2020-12-04 NOTE — Telephone Encounter (Signed)
Patients wife called to reschedule pre op appointment for next week but nothing is available she is requesting to reschedule hospital procedure and pre op.

## 2020-12-06 ENCOUNTER — Encounter: Payer: Self-pay | Admitting: Internal Medicine

## 2021-01-08 ENCOUNTER — Other Ambulatory Visit: Payer: Self-pay

## 2021-01-08 ENCOUNTER — Telehealth: Payer: Self-pay | Admitting: Internal Medicine

## 2021-01-08 ENCOUNTER — Ambulatory Visit (INDEPENDENT_AMBULATORY_CARE_PROVIDER_SITE_OTHER): Payer: 59 | Admitting: Medical

## 2021-01-08 VITALS — BP 120/84 | HR 81 | Temp 97.1°F | Wt >= 6400 oz

## 2021-01-08 DIAGNOSIS — G4733 Obstructive sleep apnea (adult) (pediatric): Secondary | ICD-10-CM | POA: Diagnosis not present

## 2021-01-08 DIAGNOSIS — I700 Atherosclerosis of aorta: Secondary | ICD-10-CM

## 2021-01-08 DIAGNOSIS — E1165 Type 2 diabetes mellitus with hyperglycemia: Secondary | ICD-10-CM

## 2021-01-08 DIAGNOSIS — I10 Essential (primary) hypertension: Secondary | ICD-10-CM

## 2021-01-08 DIAGNOSIS — R9431 Abnormal electrocardiogram [ECG] [EKG]: Secondary | ICD-10-CM | POA: Insufficient documentation

## 2021-01-08 DIAGNOSIS — R200 Anesthesia of skin: Secondary | ICD-10-CM | POA: Insufficient documentation

## 2021-01-08 DIAGNOSIS — E785 Hyperlipidemia, unspecified: Secondary | ICD-10-CM

## 2021-01-08 LAB — CBC WITH DIFFERENTIAL/PLATELET
Basophils Absolute: 0 10*3/uL (ref 0.0–0.2)
Basos: 0 %
EOS (ABSOLUTE): 0.1 10*3/uL (ref 0.0–0.4)
Eos: 2 %
Hematocrit: 37.9 % (ref 37.5–51.0)
Hemoglobin: 12.6 g/dL — ABNORMAL LOW (ref 13.0–17.7)
Lymphocytes Absolute: 1.8 10*3/uL (ref 0.7–3.1)
Lymphs: 35 %
MCH: 28.4 pg (ref 26.6–33.0)
MCHC: 33.2 g/dL (ref 31.5–35.7)
MCV: 85 fL (ref 79–97)
Monocytes Absolute: 0.4 10*3/uL (ref 0.1–0.9)
Monocytes: 8 %
Neutrophils Absolute: 2.9 10*3/uL (ref 1.4–7.0)
Neutrophils: 55 %
Platelets: 377 10*3/uL (ref 150–450)
RBC: 4.44 x10E6/uL (ref 4.14–5.80)
RDW: 15.6 % — ABNORMAL HIGH (ref 11.6–15.4)
WBC: 5.2 10*3/uL (ref 3.4–10.8)

## 2021-01-08 LAB — BASIC METABOLIC PANEL
BUN/Creatinine Ratio: 13 (ref 9–20)
BUN: 17 mg/dL (ref 6–24)
CO2: 26 mmol/L (ref 20–29)
Calcium: 9.9 mg/dL (ref 8.7–10.2)
Chloride: 102 mmol/L (ref 96–106)
Creatinine, Ser: 1.27 mg/dL (ref 0.76–1.27)
Glucose: 127 mg/dL — ABNORMAL HIGH (ref 65–99)
Potassium: 4.1 mmol/L (ref 3.5–5.2)
Sodium: 133 mmol/L — ABNORMAL LOW (ref 134–144)
eGFR: 68 mL/min/{1.73_m2} (ref >59)

## 2021-01-08 LAB — POCT GLYCOSYLATED HEMOGLOBIN (HGB A1C): Hemoglobin A1C: 6.6 % — AB (ref 4.0–5.6)

## 2021-01-08 NOTE — Telephone Encounter (Signed)
Pt needs a stat peer to peer for CT head neck angio  Phone #  [phone] Reference Number # [id number] Member ID- [id number] CPT-  [phone] and [phone]  Let me know when approved and I will have Caryn schedule it

## 2021-01-08 NOTE — Progress Notes (Signed)
Subjective:  Christopher Skinner is a 50 y.o. male who presents for Chief Complaint  Patient presents with   right arm    Right arm numbness and went down right side. Some wooziness. It happen Saturday and Sunday. Arm didn't feel heavy      Patient Care Team: Abron Neddo, Leward Quan as PCP - General (Family Medicine) Sees dentist Sees eye doctor, Dr. Katy Fitch Dr. Silvano Rusk, GI   Here for episodes of right arm and side numbness.  2 days ago had "episode" where he felt right arm felt numb as well as right side down to hip, but not into leg.  When it happened 2 days ago Saturday was seated watching football game on the tv.    Felt numbness out of the blue, lasted a few minutes then went away.  During that time he had function to move the arm and hand, just numb sensation.   The next day Sunday had another similar episodes, lasted about same amount of time as well.  He was worried about stroke.  He took some aspirin yesterday.  Denies chest pain, palpations.   No confusion, no slurred speech, no incontinence, no other numbness or weakness.   No recent fall.  No unusual affect during this.    No recent neck pain or shoulder pain, no recent injury or fall.    He checks sugars some but not in the last week.  No recent lows or highs.    He reports compliance with medication  He denies ataxia, no recent confusion, no recent fall, no recent strenuous activity or injury.  Today feels fine, no numbness tingling or weakness today.  He has sleep apnea but not using his CPAP.  He says he cannot tolerate the machine  No other aggravating or relieving factors.    No other c/o.  Past Medical History:  Diagnosis Date   COVID-19 05/02/2019   Diabetes mellitus without complication ([place]) 2951   GERD (gastroesophageal reflux disease)    H/O exercise stress test 2009   reportedly normal per patient   Hyperlipidemia    Hypertension 2008   Microalbuminuria 2019   Obesity    Sleep apnea    last  used CPAP 2014; quit on his own   Current Outpatient Medications on File Prior to Visit  Medication Sig Dispense Refill   albuterol (PROVENTIL HFA;VENTOLIN HFA) 108 (90 Base) MCG/ACT inhaler Inhale 2 puffs into the lungs every 4 (four) hours as needed for wheezing or shortness of breath. 1 Inhaler 0   atorvastatin (LIPITOR) 80 MG tablet Take 1 tablet (80 mg total) by mouth daily. 90 tablet 0   blood glucose meter kit and supplies KIT Dispense based on patient and insurance preference. Use 1-2 times daily as directed. (FOR ICD-10 DE11.69). 1 each 0   Blood Glucose Monitoring Suppl (TRUE METRIX METER) DEVI 1 Device by Does not apply route daily. 1 each 0   carvedilol (COREG) 12.5 MG tablet Take 1 tablet (12.5 mg total) by mouth 2 (two) times daily with a meal. 180 tablet 3   empagliflozin (JARDIANCE) 25 MG TABS tablet Take 1 tablet (25 mg total) by mouth daily before breakfast. 90 tablet 3   glucose blood test strip True metrix strips to test 1-2 times daily 100 each 12   Lancets MISC Use as instructed. Needs lancets based on pt's meter (true metrix) 100 each 12   losartan-hydrochlorothiazide (HYZAAR) 100-12.5 MG tablet Take 1 tablet by mouth daily. 90 tablet  3   metFORMIN (GLUCOPHAGE) 850 MG tablet Take 1 tablet (850 mg total) by mouth 2 (two) times daily with a meal. 180 tablet 3   RABEprazole (ACIPHEX) 20 MG tablet Take 1 tablet (20 mg total) by mouth daily. 90 tablet 3   Semaglutide, 2 MG/DOSE, (OZEMPIC, 2 MG/DOSE,) 8 MG/3ML SOPN Inject 2 mg into the skin once a week. 3 mL 5   vitamin B-12 (CYANOCOBALAMIN) 1000 MCG tablet Take 1 tablet (1,000 mcg total) by mouth daily. 90 tablet 3   No current facility-administered medications on file prior to visit.     The following portions of the patient's history were reviewed and updated as appropriate: allergies, current medications, past family history, past medical history, past social history, past surgical history and problem  list.  ROS Otherwise as in subjective above    Objective: BP 120/84   Pulse 81   Temp (!) 97.1 F (36.2 C)   Wt (!) 457 lb 12.8 oz (207.7 kg)   BMI 66.64 kg/m   General appearance: alert, no distress, well developed, well nourished HEENT: normocephalic, sclerae anicteric, conjunctiva pink and moist neck: supple, no lymphadenopathy, no thyromegaly, no masses, no bruits Heart: RRR, normal S1, S2, no murmurs Lungs: CTA bilaterally, no wheezes, rhonchi, or rales Abdomen: +bs, soft, non tender, non distended, no masses, no hepatomegaly, no splenomegaly Pulses: 2+ radial pulses, 2+ pedal pulses, normal cap refill Ext: no edema Neuro oriented x3, answers questions appropriately, recall normal, CN II through XII intact, arms and legs with normal strength and sensation, DTRs within normal, no obvious deficits, no ataxia.   EKG Indication right-sided numbness, rate 86 bpm, PR 256 ms, QRS 92 ms, QTC 461 ms, Axis 20 degrees, sinus rhythm with first-degree AV block, AV block first-degree is new compared to EKG a year ago.  Otherwise no other changes  Echocardiogram 05/2019 FINDINGS   Left Ventricle: Left ventricular ejection fraction, by estimation, is 55  to 60%. The left ventricle has normal function. The left ventricle has no  regional wall motion abnormalities. The left ventricular internal cavity  size was normal in size. There is   no left ventricular hypertrophy. Left ventricular diastolic parameters  were normal.     Assessment: Encounter Diagnoses  Name Primary?   Right arm numbness Yes   Right sided numbness    Type 2 diabetes mellitus with hyperglycemia, unspecified whether long term insulin use (HCC)    OSA (obstructive sleep apnea)    Morbid obesity (HCC)    Hyperlipidemia, unspecified hyperlipidemia type    Essential hypertension, benign    Aortic atherosclerosis (HCC)    EKG abnormalities      Plan: We discussed symptoms and concerns.  EKG reviewed.  I  reviewed echocardiogram in the chart record as above notated.  We will send him for stat CT as below.  Labs today.  I recommended he continue his current medications, needs to be checking blood sugars regularly not as infrequently as he is doing.  There is a new EKG changes today with first-degree block.  Referral to cardiology as well  Sleep apnea, noncompliant with CPAP  We discussed signs and symptoms of a stroke that would prompt a call to [place] was seen today for right arm.  Diagnoses and all orders for this visit:  Right arm numbness -     EKG 12-Lead -     Cancel: CT HEAD WO CONTRAST (5MM); Future -     Cancel: Comprehensive metabolic panel -  Cancel: CBC with Differential/Platelet -     CT ANGIO HEAD NECK W WO CM (CODE STROKE); Future -     Basic metabolic panel -     CBC with Differential/Platelet  Right sided numbness -     EKG 12-Lead -     Cancel: CT HEAD WO CONTRAST (5MM); Future -     Cancel: Comprehensive metabolic panel -     Cancel: CBC with Differential/Platelet -     CT ANGIO HEAD NECK W WO CM (CODE STROKE); Future -     Basic metabolic panel -     CBC with Differential/Platelet  Type 2 diabetes mellitus with hyperglycemia, unspecified whether long term insulin use (HCC) -     Cancel: CT HEAD WO CONTRAST (5MM); Future -     Cancel: Hemoglobin A1c -     CT ANGIO HEAD NECK W WO CM (CODE STROKE); Future -     Basic metabolic panel -     CBC with Differential/Platelet -     HgB A1c  OSA (obstructive sleep apnea) -     Cancel: CT HEAD WO CONTRAST (5MM); Future  Morbid obesity ([place])  Hyperlipidemia, unspecified hyperlipidemia type  Essential hypertension, benign -     Cancel: CT HEAD WO CONTRAST (5MM); Future -     CT ANGIO HEAD NECK W WO CM (CODE STROKE); Future  Aortic atherosclerosis ([place]) -     Ambulatory referral to Cardiology -     CT ANGIO HEAD NECK W WO CM (CODE STROKE); Future  EKG abnormalities -     Ambulatory referral to  Cardiology -     CT ANGIO HEAD NECK W WO CM (CODE STROKE); Future  Spent > 45 minutes face to face with patient in discussion of symptoms, evaluation, plan and recommendations.    Follow up: pending CT, labs

## 2021-01-08 NOTE — Telephone Encounter (Signed)
Pt was advised that he will get a call tomorrow to get a walk in CT scheduled

## 2021-01-09 ENCOUNTER — Other Ambulatory Visit: Payer: Self-pay | Admitting: Medical

## 2021-01-09 ENCOUNTER — Encounter: Payer: Self-pay | Admitting: Internal Medicine

## 2021-01-09 ENCOUNTER — Ambulatory Visit
Admission: RE | Admit: 2021-01-09 | Discharge: 2021-01-09 | Disposition: A | Discharge status: Home / Self Care | Payer: 59 | Source: Ambulatory Visit | Attending: Medical | Admitting: Medical

## 2021-01-09 DIAGNOSIS — I700 Atherosclerosis of aorta: Secondary | ICD-10-CM

## 2021-01-09 DIAGNOSIS — I10 Essential (primary) hypertension: Secondary | ICD-10-CM

## 2021-01-09 DIAGNOSIS — R200 Anesthesia of skin: Secondary | ICD-10-CM

## 2021-01-09 DIAGNOSIS — E785 Hyperlipidemia, unspecified: Secondary | ICD-10-CM

## 2021-01-09 DIAGNOSIS — E1165 Type 2 diabetes mellitus with hyperglycemia: Secondary | ICD-10-CM

## 2021-01-09 DIAGNOSIS — R9431 Abnormal electrocardiogram [ECG] [EKG]: Secondary | ICD-10-CM

## 2021-01-09 DIAGNOSIS — R9089 Other abnormal findings on diagnostic imaging of central nervous system: Secondary | ICD-10-CM

## 2021-01-09 DIAGNOSIS — G4733 Obstructive sleep apnea (adult) (pediatric): Secondary | ICD-10-CM

## 2021-01-09 MED ORDER — ASPIRIN EC 325 MG PO TBEC: 325.0000 mg | DELAYED_RELEASE_TABLET | Freq: Every day | ORAL | 3 refills | Status: DC

## 2021-01-09 MED ORDER — IOPAMIDOL (ISOVUE-370) INJECTION 76%
100.0000 mL | Freq: Once | INTRAVENOUS | Status: AC | PRN
  Administered 2021-01-09: 100 mL via INTRAVENOUS

## 2021-01-09 NOTE — Telephone Encounter (Signed)
Pt has an appt today for CT. He is aware

## 2021-01-10 ENCOUNTER — Other Ambulatory Visit: Payer: Self-pay | Admitting: Medical

## 2021-01-10 DIAGNOSIS — I10 Essential (primary) hypertension: Secondary | ICD-10-CM

## 2021-01-10 DIAGNOSIS — E1165 Type 2 diabetes mellitus with hyperglycemia: Secondary | ICD-10-CM

## 2021-01-10 DIAGNOSIS — E785 Hyperlipidemia, unspecified: Secondary | ICD-10-CM

## 2021-01-10 DIAGNOSIS — G4733 Obstructive sleep apnea (adult) (pediatric): Secondary | ICD-10-CM

## 2021-01-10 NOTE — Progress Notes (Signed)
referral

## 2021-01-12 ENCOUNTER — Ambulatory Visit: Payer: 59 | Admitting: Neurology

## 2021-01-22 ENCOUNTER — Ambulatory Visit (AMBULATORY_SURGERY_CENTER): Payer: 59 | Admitting: *Deleted

## 2021-01-22 ENCOUNTER — Other Ambulatory Visit: Payer: Self-pay

## 2021-01-22 VITALS — Ht 69.0 in | Wt >= 6400 oz

## 2021-01-22 DIAGNOSIS — Z1211 Encounter for screening for malignant neoplasm of colon: Secondary | ICD-10-CM

## 2021-01-22 MED ORDER — PEG 3350-KCL-NA BICARB-NACL 420 G PO SOLR: 4000.0000 mL | Freq: Once | ORAL | 0 refills | Status: AC

## 2021-01-22 NOTE — Progress Notes (Signed)
No egg or soy allergy known to patient  No issues known to pt with past sedation with any surgeries or procedures Patient denies ever being told they had issues or difficulty with intubation  No FH of Malignant Hyperthermia Pt is not on diet pills Pt is not on  home 02  Pt is not on blood thinners  Pt denies issues with constipation  No A fib or A flutter    Pt is fully vaccinated  for Covid   Due to the COVID-19 pandemic we are asking patients to follow certain guidelines.  Pt aware of COVID protocols and [facility] guidelines  Pt verified name, DOB, address and insurance during PV today.  Pt mailed instruction packet of Emmi video, copy of consent form to read and not return, and instructions.   PV completed over the phone.  Pt encouraged to call with questions or issues.  My Chart instructions to pt as well

## 2021-01-29 ENCOUNTER — Telehealth: Payer: Self-pay | Admitting: Internal Medicine

## 2021-01-29 NOTE — Telephone Encounter (Signed)
Changed to Miralax prep for the [facility]  Wife made aware of changes - Pt has My Chart new instructions sent in My chart

## 2021-01-29 NOTE — Telephone Encounter (Signed)
Inbound call from patient's wife asking if prep medication can be changed.  Please advise.

## 2021-01-29 NOTE — Telephone Encounter (Signed)
Attempted wife- no answer- LM to return my call

## 2021-01-30 ENCOUNTER — Encounter (HOSPITAL_COMMUNITY): Payer: Self-pay | Admitting: Internal Medicine

## 2021-02-08 ENCOUNTER — Ambulatory Visit (HOSPITAL_COMMUNITY): Payer: 59 | Admitting: Certified Registered Nurse Anesthetist

## 2021-02-08 ENCOUNTER — Other Ambulatory Visit: Payer: Self-pay

## 2021-02-08 ENCOUNTER — Encounter (HOSPITAL_COMMUNITY): Payer: Self-pay | Admitting: Internal Medicine

## 2021-02-08 ENCOUNTER — Encounter (HOSPITAL_COMMUNITY)
Admission: RE | Disposition: A | Discharge status: Home / Self Care | Payer: Self-pay | Source: Home / Self Care | Attending: Internal Medicine

## 2021-02-08 ENCOUNTER — Ambulatory Visit (HOSPITAL_COMMUNITY)
Admission: RE | Admit: 2021-02-08 | Discharge: 2021-02-08 | Disposition: A | Discharge status: Home / Self Care | Payer: 59 | Attending: Internal Medicine | Admitting: Internal Medicine

## 2021-02-08 DIAGNOSIS — D124 Benign neoplasm of descending colon: Secondary | ICD-10-CM

## 2021-02-08 DIAGNOSIS — E669 Obesity, unspecified: Secondary | ICD-10-CM | POA: Insufficient documentation

## 2021-02-08 DIAGNOSIS — Z1211 Encounter for screening for malignant neoplasm of colon: Secondary | ICD-10-CM | POA: Insufficient documentation

## 2021-02-08 DIAGNOSIS — K635 Polyp of colon: Secondary | ICD-10-CM | POA: Diagnosis not present

## 2021-02-08 DIAGNOSIS — Z8616 Personal history of COVID-19: Secondary | ICD-10-CM | POA: Insufficient documentation

## 2021-02-08 DIAGNOSIS — Z6841 Body Mass Index (BMI) 40.0 and over, adult: Secondary | ICD-10-CM | POA: Diagnosis not present

## 2021-02-08 HISTORY — PX: POLYPECTOMY: SHX5525

## 2021-02-08 HISTORY — PX: COLONOSCOPY WITH PROPOFOL: SHX5780

## 2021-02-08 LAB — GLUCOSE, CAPILLARY: Glucose-Capillary: 101 mg/dL — ABNORMAL HIGH (ref 70–99)

## 2021-02-08 SURGERY — COLONOSCOPY WITH PROPOFOL
Anesthesia: Monitor Anesthesia Care

## 2021-02-08 MED ORDER — PROPOFOL 500 MG/50ML IV EMUL
INTRAVENOUS | Status: DC | PRN
  Administered 2021-02-08: 1000 ug/kg/min via INTRAVENOUS

## 2021-02-08 MED ORDER — LACTATED RINGERS IV SOLN: INTRAVENOUS | Status: DC | PRN

## 2021-02-08 SURGICAL SUPPLY — 21 items

## 2021-02-08 NOTE — Anesthesia Preprocedure Evaluation (Addendum)
Anesthesia Evaluation  Patient identified by MRN, date of birth, ID band Patient awake    Reviewed: Allergy & Precautions, NPO status , Patient's Chart, lab work & pertinent test results  Airway Mallampati: II  TM Distance: >3 FB Neck ROM: Full    Dental  (+) Dental Advisory Given, Teeth Intact   Pulmonary sleep apnea (non-compliant w/ CPAP) ,    Pulmonary exam normal breath sounds clear to auscultation       Cardiovascular hypertension, Pt. on medications and Pt. on home beta blockers Normal cardiovascular exam Rhythm:Regular Rate:Normal     Neuro/Psych negative neurological ROS  negative psych ROS   GI/Hepatic Neg liver ROS, GERD  Controlled,  Endo/Other  diabetes, Well Controlled, Type 2, Oral Hypoglycemic AgentsMorbid obesitySuper morbid obesity BMI 67 a1c 6.6 No insulin  Renal/GU negative Renal ROS  negative genitourinary   Musculoskeletal negative musculoskeletal ROS (+)   Abdominal (+) + obese,   Peds negative pediatric ROS (+)  Hematology negative hematology ROS (+)   Anesthesia Other Findings   Reproductive/Obstetrics negative OB ROS                            Anesthesia Physical Anesthesia Plan  ASA: 4  Anesthesia Plan: MAC   Post-op Pain Management:    Induction:   PONV Risk Score and Plan: 2 and Propofol infusion and TIVA  Airway Management Planned: Natural Airway and Simple Face Mask  Additional Equipment: None  Intra-op Plan:   Post-operative Plan:   Informed Consent: I have reviewed the patients History and Physical, chart, labs and discussed the procedure including the risks, benefits and alternatives for the proposed anesthesia with the patient or authorized representative who has indicated his/her understanding and acceptance.       Plan Discussed with: CRNA  Anesthesia Plan Comments: (Super morbid obesity- low tolerance for conversion to GA/LMA )         Anesthesia Quick Evaluation

## 2021-02-08 NOTE — Anesthesia Postprocedure Evaluation (Signed)
Anesthesia Post Note  Patient: Christopher Skinner  Procedure(s) Performed: COLONOSCOPY WITH PROPOFOL POLYPECTOMY     Patient location during evaluation: PACU Anesthesia Type: MAC Level of consciousness: awake and alert Pain management: pain level controlled Vital Signs Assessment: post-procedure vital signs reviewed and stable Respiratory status: spontaneous breathing, nonlabored ventilation and respiratory function stable Cardiovascular status: blood pressure returned to baseline and stable Postop Assessment: no apparent nausea or vomiting Anesthetic complications: no   No notable events documented.  Last Vitals:  Vitals:   02/08/21 0940 02/08/21 0947  BP: 109/63 113/63  Pulse: 85 84  Resp: 16 16  Temp:    SpO2: 100% 100%    Last Pain:  Vitals:   02/08/21 0947  TempSrc:   PainSc: 0-No pain                 Pervis Hocking

## 2021-02-08 NOTE — Discharge Instructions (Signed)

## 2021-02-08 NOTE — H&P (Signed)
[facility] History and Physical   Primary Care Physician:  Carlena Hurl, PA-C   Reason for Procedure:   Colon cancer screening  Plan:    colonoscopy     HPI: Christopher Skinner is a 50 y.o. male here for a screening colonoscopy   Past Medical History:  Diagnosis Date   COVID-19 05/02/2019   Diabetes mellitus without complication ([place]) 0973   GERD (gastroesophageal reflux disease)    H/O exercise stress test 2009   reportedly normal per patient   Hyperlipidemia    Hypertension 2008   Microalbuminuria 2019   Obesity    Sleep apnea    last used CPAP 2014; quit on his own    Past Surgical History:  Procedure Laterality Date   CARPAL TUNNEL RELEASE     left   COLONOSCOPY  2010   normal per patient   HAND SURGERY     growth resection, right    Prior to Admission medications   Medication Sig Start Date End Date Taking? Authorizing Provider  aspirin EC 325 MG tablet Take 1 tablet (325 mg total) by mouth daily. 01/09/21 01/09/22 Yes Tysinger, [name], PA-C  atorvastatin (LIPITOR) 80 MG tablet Take 1 tablet (80 mg total) by mouth daily. 11/01/20 11/01/21 Yes Tysinger, [name], PA-C  carvedilol (COREG) 12.5 MG tablet Take 1 tablet (12.5 mg total) by mouth 2 (two) times daily with a meal. 11/01/20  Yes Tysinger, [name], PA-C  empagliflozin (JARDIANCE) 25 MG TABS tablet Take 1 tablet (25 mg total) by mouth daily before breakfast. 11/01/20  Yes Tysinger, [name], PA-C  losartan-hydrochlorothiazide (HYZAAR) 100-12.5 MG tablet Take 1 tablet by mouth daily. 11/01/20  Yes Tysinger, [name], PA-C  metFORMIN (GLUCOPHAGE) 850 MG tablet Take 1 tablet (850 mg total) by mouth 2 (two) times daily with a meal. 11/01/20  Yes Tysinger, [name], PA-C  RABEprazole (ACIPHEX) 20 MG tablet Take 1 tablet (20 mg total) by mouth daily. 11/01/20  Yes Tysinger, [name], PA-C  Semaglutide, 2 MG/DOSE, (OZEMPIC, 2 MG/DOSE,) 8 MG/3ML SOPN Inject 2 mg into the skin once a week. Patient taking  differently: Inject 2 mg into the skin every Thursday. 11/01/20  Yes Tysinger, [name], PA-C  vitamin B-12 (CYANOCOBALAMIN) 1000 MCG tablet Take 1 tablet (1,000 mcg total) by mouth daily. 11/01/20  Yes Tysinger, [name], PA-C  albuterol (PROVENTIL HFA;VENTOLIN HFA) 108 (90 Base) MCG/ACT inhaler Inhale 2 puffs into the lungs every 4 (four) hours as needed for wheezing or shortness of breath. Patient not taking: Reported on 02/01/2021 03/22/17   Lacretia Leigh, MD  blood glucose meter kit and supplies KIT Dispense based on patient and insurance preference. Use 1-2 times daily as directed. (FOR ICD-10 DE11.69). 06/08/19   Tysinger, [name], PA-C  Blood Glucose Monitoring Suppl (TRUE METRIX METER) DEVI 1 Device by Does not apply route daily. 06/10/19   Tysinger, [name], PA-C  glucose blood test strip True metrix strips to test 1-2 times daily 06/10/19   Tysinger, [name], PA-C  Lancets MISC Use as instructed. Needs lancets based on pt's meter (true metrix) 06/10/19   Tysinger, [name], PA-C    No current facility-administered medications for this encounter.    Allergies as of 11/02/2020 - Review Complete 10/31/2020  Allergen Reaction Noted   Shellfish allergy Nausea And Vomiting 12/15/2019    Family History  Problem Relation Age of Onset   Stroke Mother    Hypertension Mother    Hypertension Father  Hypertension Sister    Hypertension Sister    Hypertension Sister    Hypertension Sister    Hypertension Sister    Hypertension Sister    Hypertension Sister    Hypertension Sister    Hypertension Sister    Hypertension Brother    Hypertension Brother    Hypertension Brother    Hypertension Brother    Stomach cancer Brother    Hypertension Brother    Hypertension Brother    Hypertension Brother    Hypertension Brother    Hypertension Brother    Hypertension Brother    Hypertension Brother    Hypertension Brother    Hypertension Brother    Hypertension Brother    Hypertension Brother     Hypertension Brother    Hypertension Brother    Hyperlipidemia Other    Hypertension Other    Sleep apnea Other    Obesity Other    Heart disease Neg Hx    Cancer Neg Hx    Diabetes Neg Hx    Colon cancer Neg Hx    Colon polyps Neg Hx    Esophageal cancer Neg Hx    Rectal cancer Neg Hx     Social History   Socioeconomic History   Marital status: Married    Spouse name: Not on file   Number of children: 1   Years of education: Not on file   Highest education level: Not on file  Occupational History   Not on file  Tobacco Use   Smoking status: Never   Smokeless tobacco: Never  Vaping Use   Vaping Use: Never used  Substance and Sexual Activity   Alcohol use: No   Drug use: No   Sexual activity: Not on file  Other Topics Concern   Not on file  Social History Narrative   Married, has 1 daughter, [name], owns catering business, has [employer].   09/2018.      Review of Systems:  All other review of systems negative except as mentioned in the HPI.  Physical Exam: Vital signs There were no vitals taken for this visit.  General:   Alert,  Well-developed, well-nourished, pleasant and cooperative in NAD Lungs:  Clear throughout to auscultation.   Heart:  Regular rate and rhythm; no murmurs, clicks, rubs,  or gallops. Abdomen:  Soft, nontender and nondistended. Normal bowel sounds.obese   Neuro/Psych:  Alert and cooperative. Normal mood and affect. A and O x 3   '@Kenya Shiraishi'$  Simonne Maffucci, MD, [facility] [phone] (pager) 02/08/2021 8:36 AM@

## 2021-02-08 NOTE — Op Note (Signed)
[facility] Patient Name: Christopher Skinner Procedure Date: 02/08/2021 MRN: [id number] Attending MD: Gatha Mayer , MD Date of Birth: 08/29/1970 CSN: [id number] Age: 50 Admit Type: Outpatient Procedure:                Colonoscopy Indications:              Screening for colorectal malignant neoplasm, This                            is the patient's first colonoscopy Providers:                Gatha Mayer, MD, Particia Nearing, RN, Cherylynn Ridges, Technician, Luan Moore, Technician,                            Eliberto Ivory Referring MD:              Medicines:                Propofol per Anesthesia, Monitored Anesthesia Care Complications:            No immediate complications. Estimated Blood Loss:     Estimated blood loss was minimal. Procedure:                Pre-Anesthesia Assessment:                           - Prior to the procedure, a History and Physical                            was performed, and patient medications and                            allergies were reviewed. The patient's tolerance of                            previous anesthesia was also reviewed. The risks                            and benefits of the procedure and the sedation                            options and risks were discussed with the patient.                            All questions were answered, and informed consent                            was obtained. Prior Anticoagulants: The patient has                            taken no previous anticoagulant or antiplatelet  agents. ASA Grade Assessment: IV - A patient with                            severe systemic disease that is a constant threat                            to life. After reviewing the risks and benefits,                            the patient was deemed in satisfactory condition to                            undergo the procedure.                            After obtaining informed consent, the colonoscope                            was passed under direct vision. Throughout the                            procedure, the patient's blood pressure, pulse, and                            oxygen saturations were monitored continuously. The                            CF-HQ190L ([id number]) Olympus colonoscope was                            introduced through the anus and advanced to the the                            cecum, identified by appendiceal orifice and                            ileocecal valve. The colonoscopy was performed                            without difficulty. The patient tolerated the                            procedure well. The quality of the bowel                            preparation was good. The ileocecal valve,                            appendiceal orifice, and rectum were photographed. Scope In: 8:59:56 AM Scope Out: 9:19:25 AM Scope Withdrawal Time: 0 hours 17 minutes 38 seconds  Total Procedure Duration: 0 hours 19 minutes 29 seconds  Findings:      The perianal and digital rectal examinations were normal. Pertinent       negatives include normal prostate (size, shape,  and consistency).      A diminutive polyp was found in the descending colon. The polyp was       sessile. The polyp was removed with a cold snare. Resection and       retrieval were complete. Verification of patient identification for the       specimen was done. Estimated blood loss was minimal.      The exam was otherwise without abnormality on direct and retroflexion       views. Impression:               - One diminutive polyp in the descending colon,                            removed with a cold snare. Resected and retrieved.                           - The examination was otherwise normal on direct                            and retroflexion views. Moderate Sedation:      Not Applicable - Patient had care per Anesthesia. Recommendation:            - Patient has a contact number available for                            emergencies. The signs and symptoms of potential                            delayed complications were discussed with the                            patient. Return to normal activities tomorrow.                            Written discharge instructions were provided to the                            patient.                           - Resume previous diet.                           - Continue present medications.                           - Await pathology results.                           - Repeat colonoscopy is recommended. The                            colonoscopy date will be determined after pathology                            results from today's exam become available for  review.                           cc; Dorothea Ogle PA-C Procedure Code(s):        --- Professional ---                           [phone], Colonoscopy, flexible; with removal of                            tumor(s), polyp(s), or other lesion(s) by snare                            technique Diagnosis Code(s):        --- Professional ---                           Z12.11, Encounter for screening for malignant                            neoplasm of colon                           K63.5, Polyp of colon CPT copyright 2019 [employer] Association. All rights reserved. The codes documented in this report are preliminary and upon coder review may  be revised to meet current compliance requirements. Gatha Mayer, MD 02/08/2021 9:27:19 AM This report has been signed electronically. Number of Addenda: 0

## 2021-02-08 NOTE — Transfer of Care (Signed)
Immediate Anesthesia Transfer of Care Note  Patient: Christopher Skinner  Procedure(s) Performed: Procedure(s): COLONOSCOPY WITH PROPOFOL (N/A) POLYPECTOMY  Patient Location: PACU and Endoscopy Unit  Anesthesia Type:MAC  Level of Consciousness: awake, alert  and oriented  Airway & Oxygen Therapy: Patient Spontanous Breathing and Patient connected to nasal cannula oxygen  Post-op Assessment: Report given to RN and Post -op Vital signs reviewed and stable  Post vital signs: Reviewed and stable  Last Vitals:  Vitals:   02/08/21 0840  BP: (!) 140/51  Pulse: 87  Resp: 14  Temp: 36.8 C  SpO2: 280%    Complications: No apparent anesthesia complications

## 2021-02-09 ENCOUNTER — Encounter (HOSPITAL_COMMUNITY): Payer: Self-pay | Admitting: Internal Medicine

## 2021-02-09 IMAGING — CT CT ANGIO CHEST-ABD-PELV FOR DISSECTION W/ AND WO/W CM
2 of 7 series · 13 of 46 positions shown, 15 images · IV contrast (omnipaque)
Comparison: Chest radiograph 06/12/2019

CLINICAL DATA: Acute aortic syndrome suspected

EXAM:
CT ANGIOGRAPHY CHEST, ABDOMEN AND PELVIS
TECHNIQUE: Multidetector CT imaging through the chest, abdomen and pelvis was
performed using the standard protocol during bolus administration of
intravenous contrast. Multiplanar reconstructed images and MIPs were
obtained and reviewed to evaluate the vascular anatomy.
CONTRAST:  125mL OMNIPAQUE IOHEXOL 350 MG/ML SOLN

[Series 10: dissection 2mm · axial · 0.98mm/px · z∈[+816,+1400]mm · 10 of 330 slices shown, 12 images]
[im 19/330  soft-tissue]
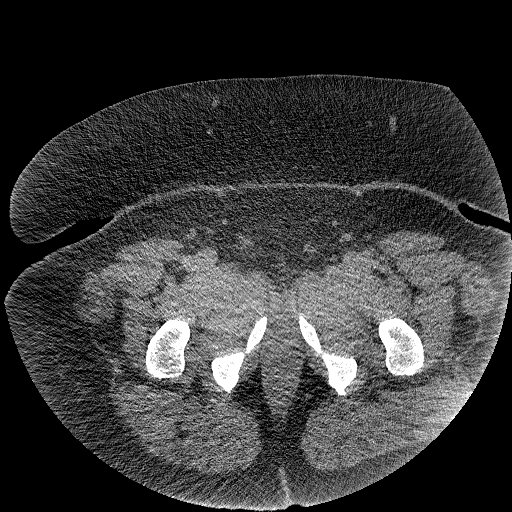
[im 19/330  bone]
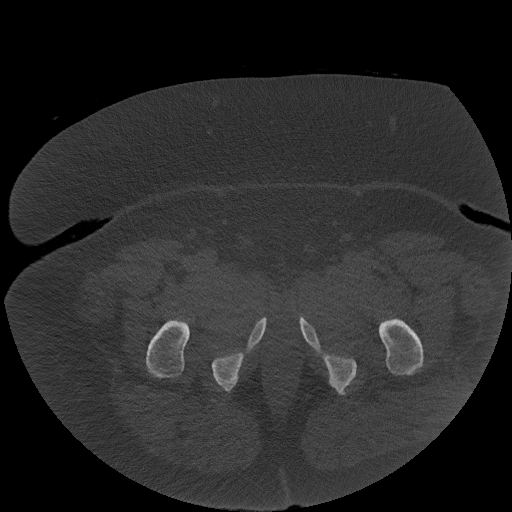
[im 55/330  soft-tissue]
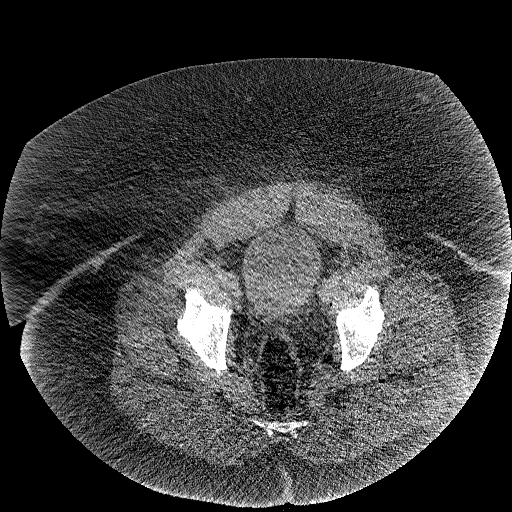
[im 92/330  soft-tissue]
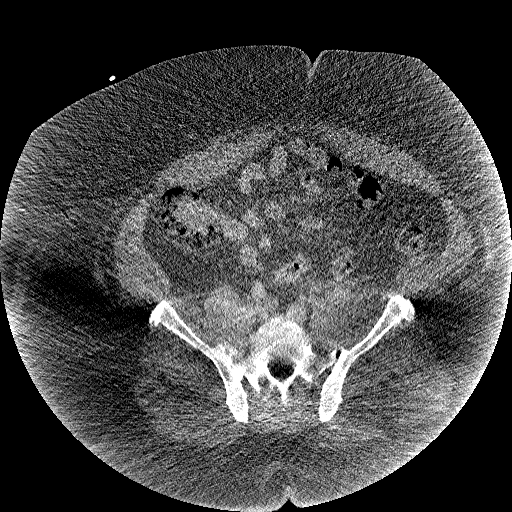
[im 110/330  soft-tissue]
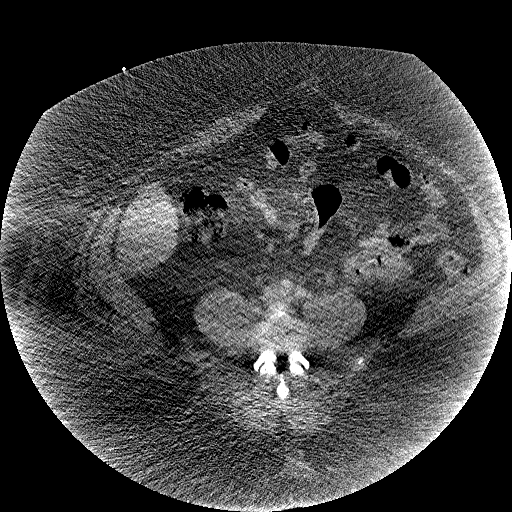
[im 147/330  soft-tissue]
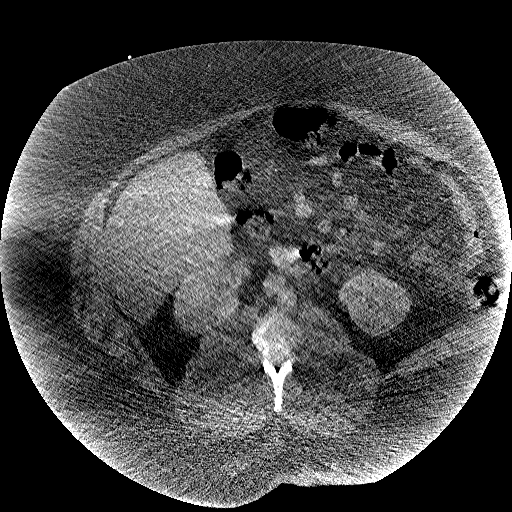
[im 183/330  soft-tissue]
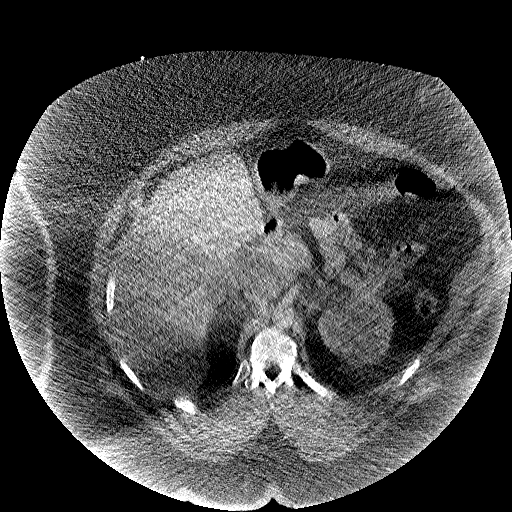
[im 220/330  soft-tissue]
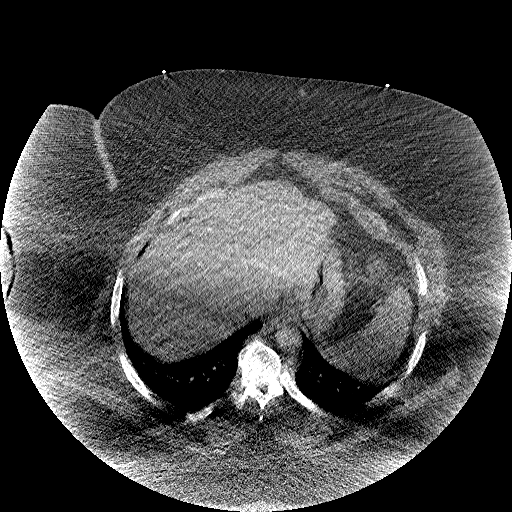
[im 238/330  soft-tissue]
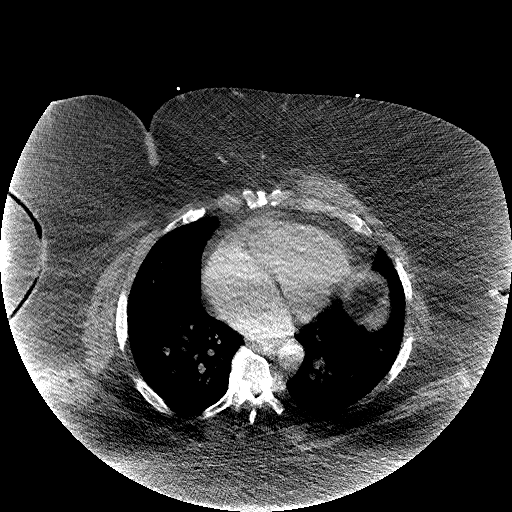
[im 275/330  soft-tissue]
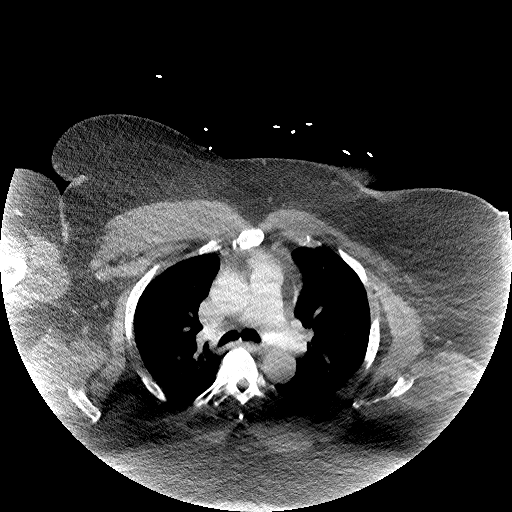
[im 275/330  bone]
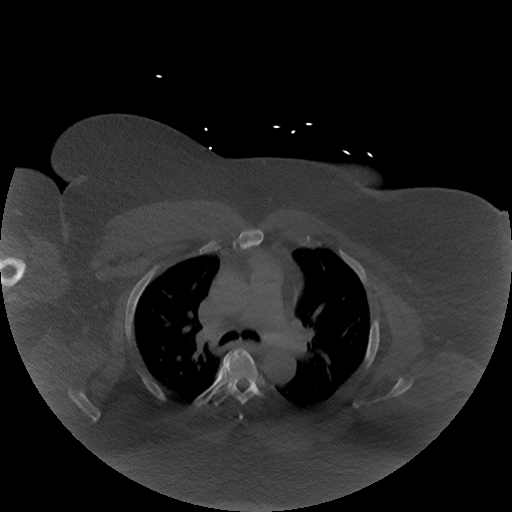
[im 311/330  soft-tissue]
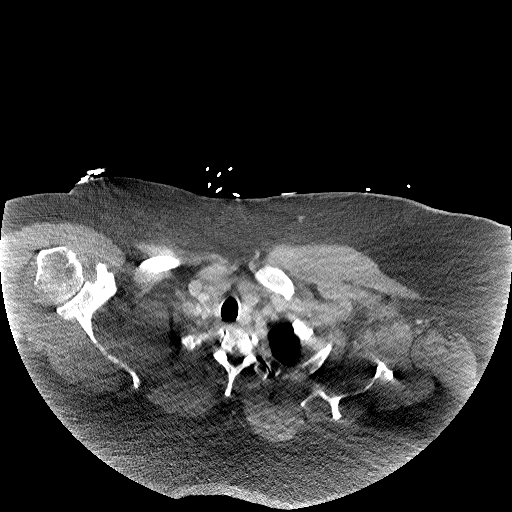

[Series 13: dissection 2mm cor · coronal · 0.87mm/px · 3 of 188 slices shown]
[im 47/188  soft-tissue]
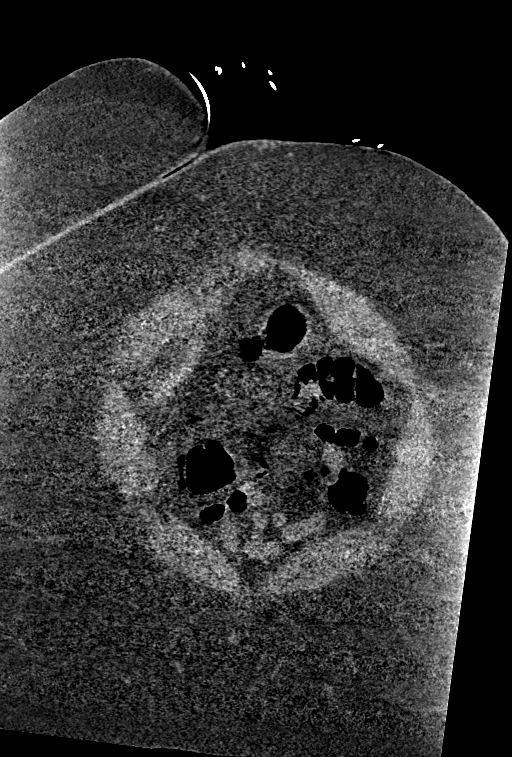
[im 94/188  soft-tissue]
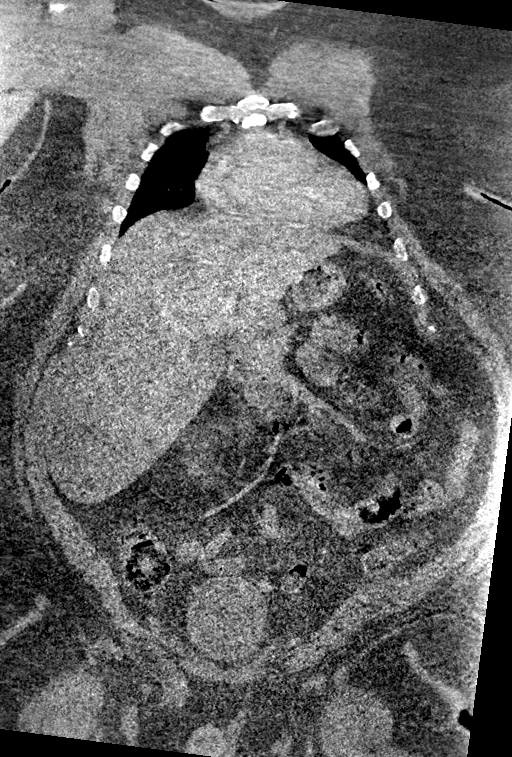
[im 141/188  soft-tissue]
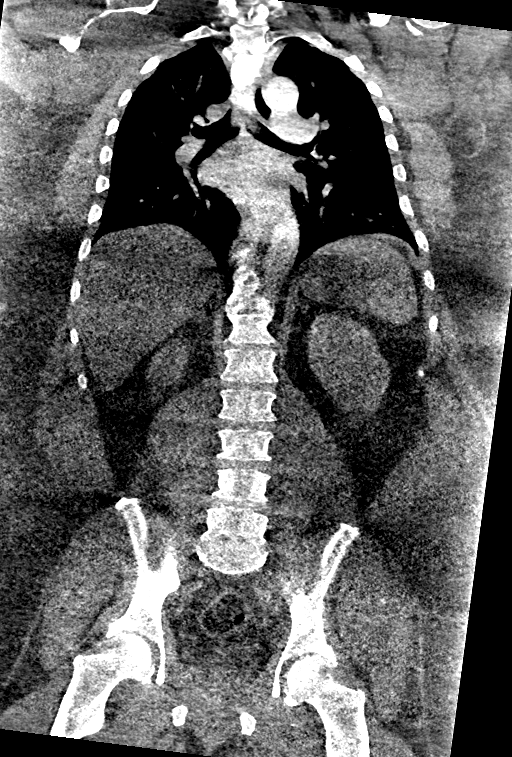

[13 of 46 positions shown; findings below may reference images not displayed]

FINDINGS: Imaging quality is severely degraded due to patient body habitus.
Angiographic images are centrally nondiagnostic for evaluation of
the aorta.

CTA CHEST FINDINGS

Cardiovascular: Suboptimal visualization of the aorta both on
noncontrast and postcontrast imaging due to extensive photon
starvation secondary to body habitus. Images are essentially
nondiagnostic for evaluation of luminal abnormality. No periaortic
stranding, hemorrhage or gas is seen. Normal caliber thoracic aorta.
Shared origin of the brachiocephalic and left common carotid
arteries. Cardiac size at the upper limits of normal. Central
pulmonary arteries are normal caliber with luminal evaluation
precluded by degraded imaging quality.

Mediastinum/Nodes: No mediastinal fluid or gas. Normal thyroid gland
and thoracic inlet. No acute abnormality of the trachea or
esophagus. No worrisome mediastinal, hilar or axillary adenopathy.

Lungs/Pleura: No consolidation, features of edema, pneumothorax, or
effusion. No suspicious pulmonary nodules or masses.

Musculoskeletal: Multilevel degenerative changes are present in the
imaged portions of the spine. Some flowing anterior osteophytosis
may be reflective of diffuse a pathic skeletal hyperostosis (DISH).

Review of the MIP images confirms the above findings.

CTA ABDOMEN AND PELVIS FINDINGS

VASCULAR

Evaluation of the abdominopelvic vasculature is severely degraded
due to patient body habitus and resulting photon starvation
artifact.

Aorta: No gross aneurysmal dilatation. Luminal evaluation is
precluded.

Celiac: Poorly visualized.  No gross aneurysmal dilation.

SMA: Poorly visualized.  Normal course and caliber of the vessel.

Renals: Poorly visualized

IMA: Poorly visualized

Inflow: Poorly visualized. Appears opacified normally without
aneurysm or ectasia. Luminal evaluation precluded.

Veins: Major venous structures are unremarkable.

Review of the MIP images confirms the above findings.

NON-VASCULAR

Hepatobiliary: No gross hepatic abnormality. Hepatomegaly is noted
with liver measuring nearly 30 cm craniocaudal. Gallbladder i and
biliary tree are poorly visualized.

Pancreas: Grossly normal appearance of the pancreas

Spleen: Normal splenic size.  No gross splenic abnormality.

Adrenals/Urinary Tract: Normal adrenal glands. Kidneys are normal
and symmetric. No visible or contour deforming renal lesions. No
evident hydronephrosis. Urinary bladder is grossly unremarkable.

Stomach/Bowel: Small hiatal hernia. Stomach and duodenal sweep are
unremarkable. No evidence of small-bowel obstruction. No frank
dilatation or wall thickening. Appendix is poorly visualized. No
colonic dilatation or wall thickening. Scattered colonic diverticula
without focal pericolonic inflammation to suggest diverticulitis. No
colonic thickening or dilatation.

Lymphatic: No suspicious or enlarged lymph nodes in the included
lymphatic chains.

Reproductive: Dense The prostate and seminal vesicles are
unremarkable.

Other: No free fluid. No free air. No bowel containing hernias.
Extensive body wall edema. Incomplete inclusion artifact as well as
photon starvation significantly degrade imaging quality.

Musculoskeletal: Multilevel degenerative changes are present in the
imaged portions of the spine. No acute osseous abnormality or
suspicious osseous lesion.

Review of the MIP images confirms the above findings.
IMPRESSION: 1. Patient's body habitus results in significant photon starvation
and incomplete inclusion artifact with multidirectional beam
hardening throughout the chest, abdomen and pelvis. This
significantly degrades imaging quality in limits diagnostic utility
of this angiographic study.
2. Luminal evaluation of the aorta and branch vessels is severely
limited. No gross aneurysmal change or periaortic inflammation is
seen.
3. Evaluation of the pulmonary arteries is nondiagnostic.
4. Hepatomegaly.
5. Mild cardiomegaly.
6. Aortic Atherosclerosis ([id number]).

These results and limitations of this examination were called by
telephone at the time of interpretation on 06/12/2019 at [DATE] to
provider DAVID VOLTAGE SUMESH AS , who verbally acknowledged these results.

## 2021-02-10 IMAGING — DX DG LUMBAR SPINE 2-3V
3 series · 4 of 4 positions shown · non-contrast
Comparison: 02/09/12.

CLINICAL DATA: Low back pain.

EXAM:
LUMBAR SPINE - 2-3 VIEW

[l-spine ap (1 of 2)]
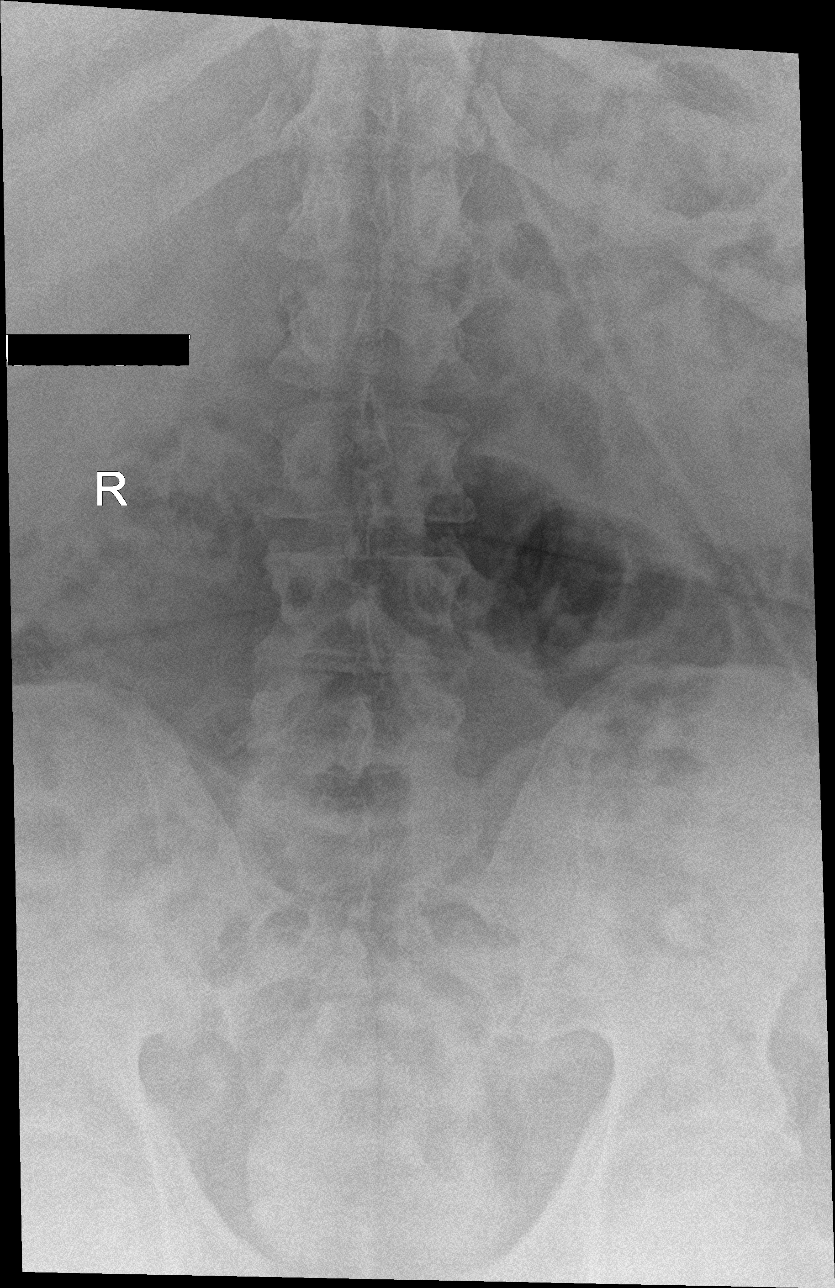

[Series 2: l-spine lat · 0.14mm/px · 2 of 2 slices shown]
[im 1/2]
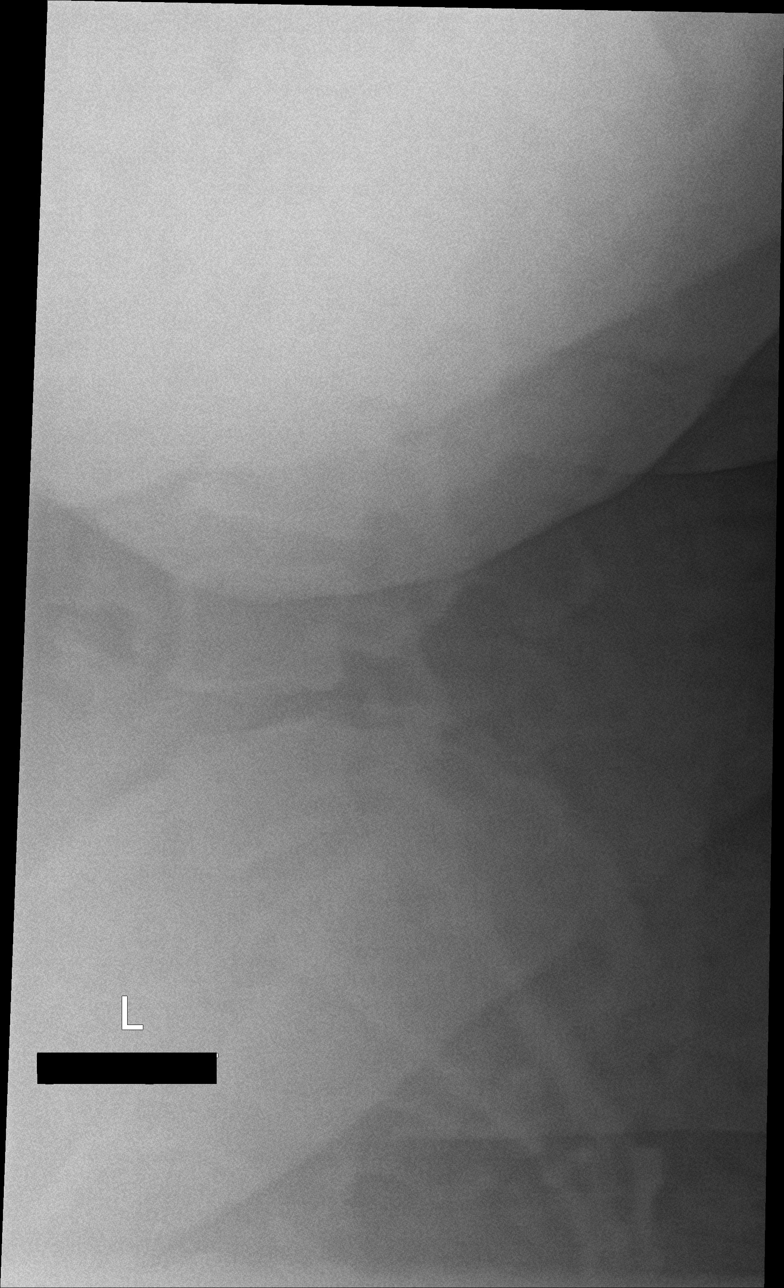
[im 2/2]
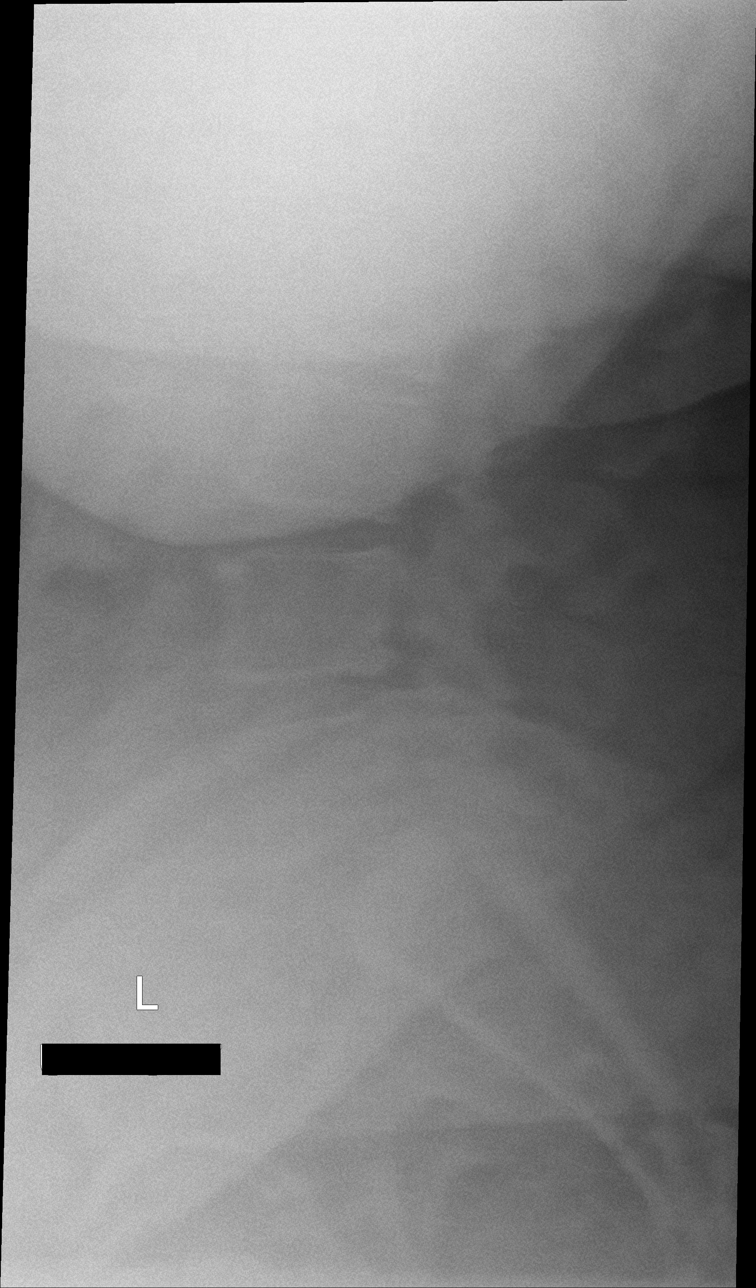

[l-spine ap (2 of 2)]
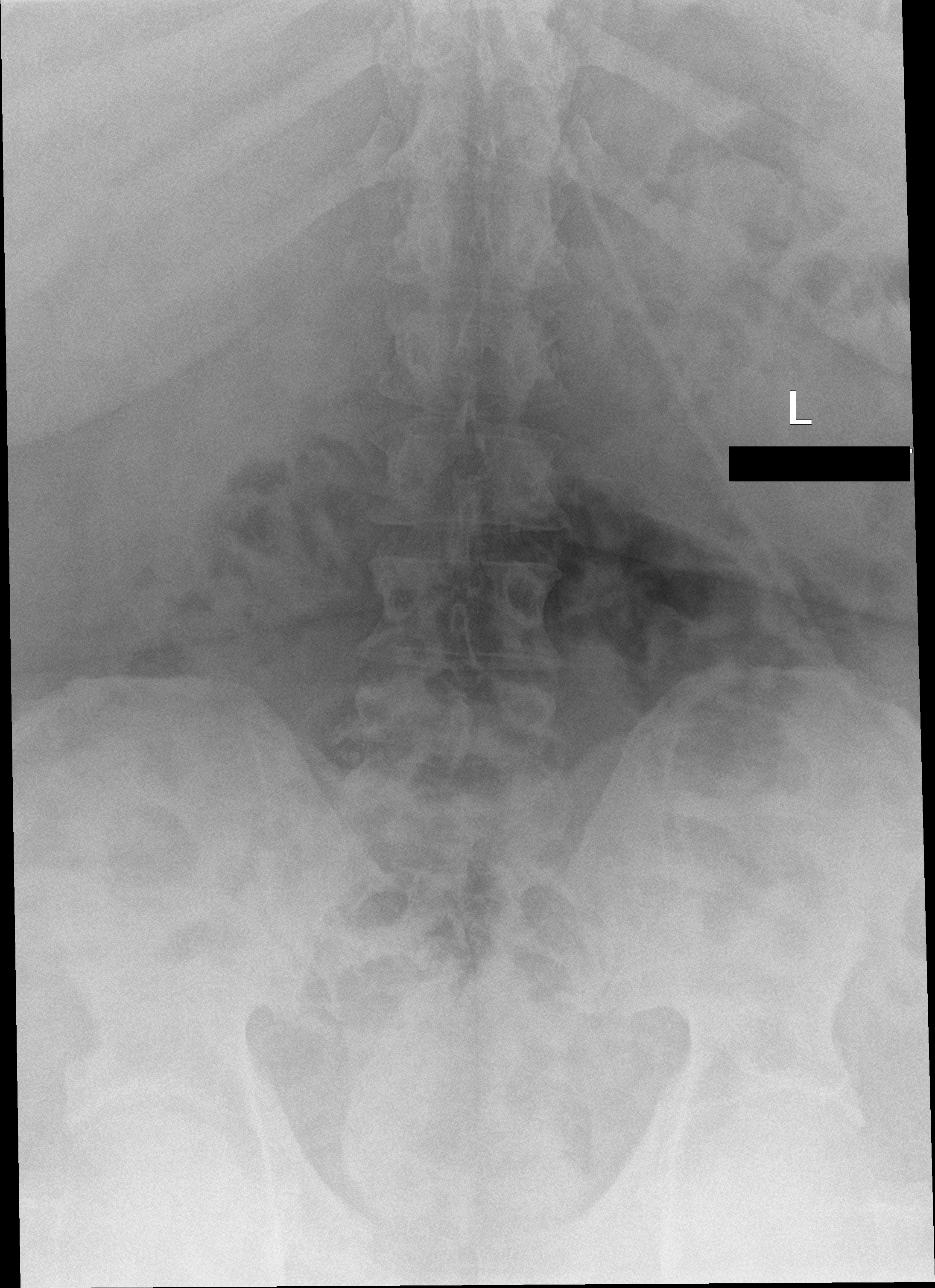

[4 of 4 positions shown; findings below may reference images not displayed]

FINDINGS: Markedly diminished exam detail secondary to body habitus. The
alignment appears normal. No fracture identified. Mild multilevel
endplate spurring identified within the lumbar spine.
IMPRESSION: 1. Markedly diminished exam detail as above.  No acute findings.
2. Mild degenerative disc disease.

## 2021-02-13 LAB — SURGICAL PATHOLOGY

## 2021-02-19 ENCOUNTER — Encounter: Payer: Self-pay | Admitting: Internal Medicine

## 2021-03-11 NOTE — Progress Notes (Deleted)
Cardiology Office Note:    Date:  03/11/2021   ID:  ALEXANDRE FARIES, DOB 24-Apr-1971, MRN [id number]  PCP:  Carlena Hurl, PA-C  Cardiologist:  None   Referring MD: Carlena Hurl, PA-C   No chief complaint on file.   History of Present Illness:    Christopher Skinner is a 50 y.o. male with a hx of OSA, hyperlipidemia, obesity, DM II, Primary hypertension, and aortic atherosclerosis.   ***  Past Medical History:  Diagnosis Date   COVID-19 05/02/2019   Diabetes mellitus without complication ([place]) 2694   GERD (gastroesophageal reflux disease)    H/O exercise stress test 2009   reportedly normal per patient   Hyperlipidemia    Hypertension 2008   Microalbuminuria 2019   Obesity    Sleep apnea    last used CPAP 2014; quit on his own    Past Surgical History:  Procedure Laterality Date   CARPAL TUNNEL RELEASE     left   COLONOSCOPY  2010   normal per patient   COLONOSCOPY WITH PROPOFOL N/A 02/08/2021   Procedure: COLONOSCOPY WITH PROPOFOL;  Surgeon: Gatha Mayer, MD;  Location: [facility];  Service: Endoscopy;  Laterality: N/A;   HAND SURGERY     growth resection, right   POLYPECTOMY  02/08/2021   Procedure: POLYPECTOMY;  Surgeon: Gatha Mayer, MD;  Location: [facility];  Service: Endoscopy;;    Current Medications: No outpatient medications have been marked as taking for the 03/12/21 encounter (Appointment) with Belva Crome, MD.     Allergies:   Shellfish allergy   Social History   Socioeconomic History   Marital status: Married    Spouse name: Not on file   Number of children: 1   Years of education: Not on file   Highest education level: Not on file  Occupational History   Not on file  Tobacco Use   Smoking status: Never   Smokeless tobacco: Never  Vaping Use   Vaping Use: Never used  Substance and Sexual Activity   Alcohol use: No   Drug use: No   Sexual activity: Not on file  Other Topics Concern   Not on file   Social History Narrative   Married, has 1 daughter, [name], owns catering business, has Quarry manager.   09/2018.     Social Determinants of Health   Financial Resource Strain: Not on file  Food Insecurity: Not on file  Transportation Needs: Not on file  Physical Activity: Not on file  Stress: Not on file  Social Connections: Not on file     Family History: The patient's family history includes Hyperlipidemia in an other family member; Hypertension in his brother, brother, brother, brother, brother, brother, brother, brother, brother, brother, brother, brother, brother, brother, brother, brother, brother, father, mother, sister, sister, sister, sister, sister, sister, sister, sister, sister, and another family member; Obesity in an other family member; Sleep apnea in an other family member; Stomach cancer in his brother; Stroke in his mother. There is no history of Heart disease, Cancer, Diabetes, Colon cancer, Colon polyps, Esophageal cancer, or Rectal cancer.  ROS:   Please see the history of present illness.    *** All other systems reviewed and are negative.  EKGs/Labs/Other Studies Reviewed:    The following studies were reviewed today: ***  EKG:  EKG  02/08/2021 revealed 1 degree AVB, and otherwise normal.  Recent Labs: 10/31/2020: ALT 24; TSH 2.470 01/08/2021: BUN 17; Creatinine, Ser 1.27; Hemoglobin  12.6; Platelets 377; Potassium 4.1; Sodium 133  Recent Lipid Panel    Component Value Date/Time   CHOL 214 (H) 10/31/2020 1330   TRIG 122 10/31/2020 1330   HDL 44 10/31/2020 1330   CHOLHDL 4.9 10/31/2020 1330   CHOLHDL 5.2 06/13/2019 0547   VLDL 18 06/13/2019 0547   LDLCALC 148 (H) 10/31/2020 1330    Physical Exam:    VS:  There were no vitals taken for this visit.    Wt Readings from Last 3 Encounters:  02/08/21 (!) 456 lb (206.8 kg)  01/22/21 (!) 456 lb (206.8 kg)  01/08/21 (!) 457 lb 12.8 oz (207.7 kg)     GEN: ***. No acute distress HEENT: Normal NECK: No  JVD. LYMPHATICS: No lymphadenopathy CARDIAC: *** murmur. RRR *** gallop, or edema. VASCULAR: *** Normal Pulses. No bruits. RESPIRATORY:  Clear to auscultation without rales, wheezing or rhonchi  ABDOMEN: Soft, non-tender, non-distended, No pulsatile mass, MUSCULOSKELETAL: No deformity  SKIN: Warm and dry NEUROLOGIC:  Alert and oriented x 3 PSYCHIATRIC:  Normal affect   ASSESSMENT:    1. Aortic atherosclerosis ([place])   2. Hyperlipidemia, unspecified hyperlipidemia type   3. Essential hypertension, benign   4. OSA (obstructive sleep apnea)   5. Type 2 diabetes mellitus with hyperglycemia, unspecified whether long term insulin use ([place])   6. Morbid obesity ([place])    PLAN:    In order of problems listed above:  ***   Medication Adjustments/Labs and Tests Ordered: Current medicines are reviewed at length with the patient today.  Concerns regarding medicines are outlined above.  No orders of the defined types were placed in this encounter.  No orders of the defined types were placed in this encounter.   There are no Patient Instructions on file for this visit.   Signed, Sinclair Grooms, MD  03/11/2021 7:13 PM    [facility]

## 2021-03-12 ENCOUNTER — Ambulatory Visit: Payer: 59 | Admitting: Interventional Cardiology

## 2021-03-12 DIAGNOSIS — E785 Hyperlipidemia, unspecified: Secondary | ICD-10-CM

## 2021-03-12 DIAGNOSIS — I10 Essential (primary) hypertension: Secondary | ICD-10-CM

## 2021-03-12 DIAGNOSIS — E1165 Type 2 diabetes mellitus with hyperglycemia: Secondary | ICD-10-CM

## 2021-03-12 DIAGNOSIS — I700 Atherosclerosis of aorta: Secondary | ICD-10-CM

## 2021-03-12 DIAGNOSIS — G4733 Obstructive sleep apnea (adult) (pediatric): Secondary | ICD-10-CM

## 2021-03-14 ENCOUNTER — Ambulatory Visit: Payer: 59 | Admitting: Neurology

## 2021-05-11 ENCOUNTER — Telehealth: Payer: Self-pay

## 2021-05-11 NOTE — Telephone Encounter (Signed)
[facility] PA request for patients Ozempic. RVWD chart and need updated height and weight to calculate BMI for this. Last visit was 12/2020 is due for a Diabetes visit. Called patinet to schedule this and now answer/ Voicemail is full

## 2021-05-15 ENCOUNTER — Other Ambulatory Visit: Payer: Self-pay

## 2021-05-15 ENCOUNTER — Ambulatory Visit (INDEPENDENT_AMBULATORY_CARE_PROVIDER_SITE_OTHER): Payer: 59 | Admitting: Medical

## 2021-05-15 NOTE — Telephone Encounter (Signed)
Submitted PA to [employer] [facility] via cover my meds with primary dx of E11.65, secondary e66.01

## 2021-05-15 NOTE — Telephone Encounter (Signed)
[facility] fax requesting notes to support Ozempic need.  Visit from 01/08/21 was for right arm numbness  CPE 10/31/20 notes patient compliant with Ozempic.  Do you want me to fax notes from July CPE or wait until patient is seen on 05/21/21 and fax that office note

## 2021-05-15 NOTE — Telephone Encounter (Signed)
Pt's height is 5'9 and weighs 461.4 pounds.

## 2021-05-15 NOTE — Telephone Encounter (Signed)
Waiting for coverage determination

## 2021-05-16 NOTE — Telephone Encounter (Signed)
Laid records request on your desk to fax notes after appointment on 05/21/21 please let me know if you have any questions

## 2021-05-18 ENCOUNTER — Telehealth: Payer: Self-pay

## 2021-05-18 NOTE — Telephone Encounter (Signed)
Went ahead and completed P.A. OZEMPIC and was approved til 05/15/22

## 2021-05-21 ENCOUNTER — Ambulatory Visit: Payer: 59 | Admitting: Medical

## 2021-05-21 ENCOUNTER — Other Ambulatory Visit: Payer: Self-pay

## 2021-05-21 VITALS — BP 130/80 | HR 102 | Ht 69.0 in | Wt >= 6400 oz

## 2021-05-21 DIAGNOSIS — E1165 Type 2 diabetes mellitus with hyperglycemia: Secondary | ICD-10-CM

## 2021-05-21 DIAGNOSIS — E785 Hyperlipidemia, unspecified: Secondary | ICD-10-CM

## 2021-05-21 DIAGNOSIS — Z2820 Immunization not carried out because of patient decision for unspecified reason: Secondary | ICD-10-CM

## 2021-05-21 DIAGNOSIS — Z6841 Body Mass Index (BMI) 40.0 and over, adult: Secondary | ICD-10-CM

## 2021-05-21 DIAGNOSIS — I700 Atherosclerosis of aorta: Secondary | ICD-10-CM

## 2021-05-21 DIAGNOSIS — R809 Proteinuria, unspecified: Secondary | ICD-10-CM

## 2021-05-21 DIAGNOSIS — D649 Anemia, unspecified: Secondary | ICD-10-CM

## 2021-05-21 DIAGNOSIS — I10 Essential (primary) hypertension: Secondary | ICD-10-CM

## 2021-05-21 DIAGNOSIS — G4733 Obstructive sleep apnea (adult) (pediatric): Secondary | ICD-10-CM | POA: Diagnosis not present

## 2021-05-21 LAB — POCT GLYCOSYLATED HEMOGLOBIN (HGB A1C): Hemoglobin A1C: 6.5 % — AB (ref 4.0–5.6)

## 2021-05-21 MED ORDER — LANCETS MISC: 12 refills | Status: AC

## 2021-05-21 MED ORDER — ATORVASTATIN CALCIUM 80 MG PO TABS: 80.0000 mg | ORAL_TABLET | Freq: Every day | ORAL | 3 refills | Status: DC

## 2021-05-21 MED ORDER — OZEMPIC (2 MG/DOSE) 8 MG/3ML ~~LOC~~ SOPN: 2.0000 mg | PEN_INJECTOR | SUBCUTANEOUS | 3 refills | Status: DC

## 2021-05-21 MED ORDER — GLUCOSE BLOOD VI STRP: ORAL_STRIP | 12 refills | Status: AC

## 2021-05-21 NOTE — Telephone Encounter (Signed)
Went ahead and sent records and got P.A. approved and pt notified

## 2021-05-21 NOTE — Progress Notes (Signed)
Subjective:  Christopher Skinner is a 51 y.o. male who presents for Chief Complaint  Patient presents with   diabetes    Diabetes-      Here for med check  He has a health history significant for morbid obesity, diabetes, hypertension, hyperlipidemia, microalbuminuria, OSA but did not tolerate CPAP.  He notes that he is active, going to the gym 2 days/week and active walking  Diabetes-not checking sugars.  He is compliant with Jardiance and Ozempic weekly injection.  He stopped metformin due to diarrhea.  Even at 1 tablet daily he was having bad diarrhea.  No foot concerns.  Hypertension -compliant with losartan HCT and carvedilol  Hyperlipidemia-compliant with atorvastatin 80 mg daily and aspirin daily  We had referred him to bariatric surgery in the past year.  Somehow he was having trouble getting an appointment time.  He needs help getting back in there   He had a colonoscopy October 2022 that was normal per his report  He declines any vaccines today  No other aggravating or relieving factors.    No other c/o.  Past Medical History:  Diagnosis Date   COVID-19 05/02/2019   Diabetes mellitus without complication ([place]) 5625   GERD (gastroesophageal reflux disease)    H/O exercise stress test 2009   reportedly normal per patient   Hyperlipidemia    Hypertension 2008   Microalbuminuria 2019   Obesity    Sleep apnea    last used CPAP 2014; quit on his own   Current Outpatient Medications on File Prior to Visit  Medication Sig Dispense Refill   albuterol (PROVENTIL HFA;VENTOLIN HFA) 108 (90 Base) MCG/ACT inhaler Inhale 2 puffs into the lungs every 4 (four) hours as needed for wheezing or shortness of breath. 1 Inhaler 0   aspirin EC 325 MG tablet Take 1 tablet (325 mg total) by mouth daily. 100 tablet 3   carvedilol (COREG) 12.5 MG tablet Take 1 tablet (12.5 mg total) by mouth 2 (two) times daily with a meal. 180 tablet 3   empagliflozin (JARDIANCE) 25 MG TABS tablet  Take 1 tablet (25 mg total) by mouth daily before breakfast. 90 tablet 3   losartan-hydrochlorothiazide (HYZAAR) 100-12.5 MG tablet Take 1 tablet by mouth daily. 90 tablet 3   RABEprazole (ACIPHEX) 20 MG tablet Take 1 tablet (20 mg total) by mouth daily. 90 tablet 3   vitamin B-12 (CYANOCOBALAMIN) 1000 MCG tablet Take 1 tablet (1,000 mcg total) by mouth daily. 90 tablet 3   blood glucose meter kit and supplies KIT Dispense based on patient and insurance preference. Use 1-2 times daily as directed. (FOR ICD-10 DE11.69). 1 each 0   Blood Glucose Monitoring Suppl (TRUE METRIX METER) DEVI 1 Device by Does not apply route daily. 1 each 0   No current facility-administered medications on file prior to visit.    The following portions of the patient's history were reviewed and updated as appropriate: allergies, current medications, past family history, past medical history, past social history, past surgical history and problem list.  ROS Otherwise as in subjective above  Objective: BP 130/80    Pulse (!) 102    Ht $R'5\' 9"'vM$  (1.753 m)    Wt (!) 460 lb 12.8 oz (209 kg)    BMI 68.05 kg/m   General appearance: alert, no distress, well developed, well nourished Neck: supple, no lymphadenopathy, no thyromegaly, no masses, no bruits Heart: RRR, normal S1, S2, no murmurs Lungs: CTA bilaterally, no wheezes, rhonchi, or rales Pulses:  2+ radial pulses, 2+ pedal pulses, normal cap refill Ext: no edema  Foot exam declined, deferred today    Assessment: Encounter Diagnoses  Name Primary?   Type 2 diabetes mellitus with hyperglycemia, unspecified whether long term insulin use (HCC) Yes   OSA (obstructive sleep apnea)    Microalbuminuria    Hyperlipidemia, unspecified hyperlipidemia type    Aortic atherosclerosis (HCC)    Anemia, unspecified type    Essential hypertension, benign    Vaccine refused by patient    BMI 60.0-69.9, adult ([place])      Plan: Diabetes-hemoglobin A1c at goal today.   Continue Ozempic 2 mg weekly, continue Jardiance.  He did not tolerate metformin even at low dose.  I recommend he check his sugars which she is not doing regularly  OSA-intolerant to CPAP  Morbid obesity-referral prior to bariatric clinic.  He was having difficulty getting appointment.  We will reach out to the referral coordinator to try to expedite his appointment  Hyperlipidemia-continue statin  Aortic atherosclerosis-continue statin, healthy low-cholesterol diet  Hypertension-continue current medication  We discussed recommended vaccines but he declines these today  He was nonfasting today.  He will return this week for fasting labs.  Christopher Skinner was seen today for diabetes.  Diagnoses and all orders for this visit:  Type 2 diabetes mellitus with hyperglycemia, unspecified whether long term insulin use (HCC) -     HgB A1c -     CBC; Future -     Amb Referral to Bariatric Surgery  OSA (obstructive sleep apnea) -     Amb Referral to Bariatric Surgery  Microalbuminuria  Hyperlipidemia, unspecified hyperlipidemia type -     Lipid panel; Future -     Comprehensive metabolic panel; Future  Aortic atherosclerosis (HCC) -     Amb Referral to Bariatric Surgery  Anemia, unspecified type -     CBC; Future  Essential hypertension, benign -     CBC; Future  Vaccine refused by patient  BMI 60.0-69.9, adult ([place]) -     Amb Referral to Bariatric Surgery  Other orders -     Semaglutide, 2 MG/DOSE, (OZEMPIC, 2 MG/DOSE,) 8 MG/3ML SOPN; Inject 2 mg into the skin every Thursday. -     Lancets MISC; Use as instructed. Needs lancets based on pt's meter (true metrix) -     glucose blood test strip; True metrix strips to test 1-2 times daily -     atorvastatin (LIPITOR) 80 MG tablet; Take 1 tablet (80 mg total) by mouth daily.    Follow up: This week for fasting labs

## 2021-05-21 NOTE — Telephone Encounter (Signed)
[facility] went thru for $15 with discount card they do have in stock.  Also checked on Jardiance and went thru for $45 for 90 days.  Called pt and informed

## 2021-05-22 ENCOUNTER — Other Ambulatory Visit: Payer: 59

## 2021-05-30 ENCOUNTER — Telehealth: Payer: Self-pay

## 2021-05-30 NOTE — Telephone Encounter (Signed)
Patients wife called requesting to speak to you ( [place]) call back is [phone]

## 2021-05-30 NOTE — Telephone Encounter (Signed)
Calling about weight referral

## 2021-06-04 NOTE — Progress Notes (Signed)
° °  I, [name], LAT, ATC, am serving as scribe for Dr. Lynne Leader.  Christopher Skinner is a 51 y.o. male who presents to [facility] today for L ankle pain.  He was last seen by Dr. Georgina Snell on 01/11/20 for recurrent R knee pain.  Today, pt reports L ankle pain since 06/01/21 when he slipped and fell, rolling his L ankle into inversion.  He locates his pain to his L lateral ankle.  L ankle swelling: unsure Aggravating factors: L ankle eversion AROM; walking/weight-bearing Treatments tried: ice; Advil  Pertinent review of systems: No fevers or chills  Relevant historical information: Morbid obesity.  Diabetes.  Right knee pain.   Exam:  BP 126/74 (BP Location: Right Arm, Patient Position: Sitting, Cuff Size: Large)    Pulse 94    Ht 5\' 9"  (1.753 m)    Wt (!) 467 lb 12.8 oz (212.2 kg)    SpO2 98%    BMI 69.08 kg/m  General: Well Developed, morbidly obese and in no acute distress.   MSK: Left ankle mild swelling lateral ankle. Normal ankle motion.  Some pain felt at medial foot and ankle with ankle motion and resisted strength testing. Strength is intact. No significant laxity felt with ankle laxity testing including negative anterior drawer and talar tilt testing. Pulses capillary fill and sensation are intact distally.    Lab and Radiology Results  X-ray Images left ankle obtained today personally and independently interpreted Left ankle: No acute fractures. Calcifications versus accessory ossicles present medial ankle. Degenerative changes present within the ankle joint and significant degenerative changes present in the midfoot visible. Await formal radiology review    Assessment and Plan: 51 y.o. male with ankle pain after an ankle inversion injury.  Doubtful for fracture based on x-ray appearance.  Radiology overread is still pending.  Plan for ankle brace and home exercise program taught in clinic today by ATC.  Recommend Voltaren gel.   Recheck in 1 month especially if not improved.  Consider physical therapy referral if needed.   PDMP not reviewed this encounter. Orders Placed This Encounter  Procedures   DG Ankle Complete Left    Standing Status:   Future    Number of Occurrences:   1    Standing Expiration Date:   07/03/2021    Order Specific Question:   Reason for Exam (SYMPTOM  OR DIAGNOSIS REQUIRED)    Answer:   L ankle pain    Order Specific Question:   Preferred imaging location?    Answer:   [facility]   No orders of the defined types were placed in this encounter.    Discussed warning signs or symptoms. Please see discharge instructions. Patient expresses understanding.   The above documentation has been reviewed and is accurate and complete Lynne Leader, M.D.

## 2021-06-05 ENCOUNTER — Ambulatory Visit: Payer: 59

## 2021-06-05 ENCOUNTER — Ambulatory Visit (INDEPENDENT_AMBULATORY_CARE_PROVIDER_SITE_OTHER): Payer: 59 | Admitting: Family Medicine

## 2021-06-05 ENCOUNTER — Other Ambulatory Visit: Payer: Self-pay

## 2021-06-05 ENCOUNTER — Ambulatory Visit (INDEPENDENT_AMBULATORY_CARE_PROVIDER_SITE_OTHER): Payer: 59

## 2021-06-05 ENCOUNTER — Encounter: Payer: Self-pay | Admitting: Family Medicine

## 2021-06-05 VITALS — BP 126/74 | HR 94 | Ht 69.0 in | Wt >= 6400 oz

## 2021-06-05 DIAGNOSIS — S93402A Sprain of unspecified ligament of left ankle, initial encounter: Secondary | ICD-10-CM | POA: Diagnosis not present

## 2021-06-05 NOTE — Patient Instructions (Addendum)
Good to see you today.  Look into an ankle brace at your local drugstore or on [place].  Work on ankle range of motion (up/down; in/out; ankle alphabets).  Please use Voltaren gel (Generic Diclofenac Gel) up to 4x daily for pain as needed.  This is available over-the-counter as both the name brand Voltaren gel and the generic diclofenac gel.   If not better, let me know and we can do formal physical therapy.  Follow-up: as needed

## 2021-06-06 NOTE — Progress Notes (Signed)
Ankle x-ray shows a tiny little sliver of bone that may have been pulled off the fibula (ankle bone on the outside part of your ankle).  This is technically an avulsion fracture however will behave like an ankle sprain and does not need to be casted or treated with a big walking boot if you are feeling comfortable enough. Recommend using an ankle brace and continue home exercise program.  If needed certainly could get you into physical therapy.  Recommend recheck in about 1 month.  Recommend scheduling a follow-up appoint with me

## 2021-06-07 ENCOUNTER — Telehealth: Payer: Self-pay | Admitting: Family Medicine

## 2021-06-07 NOTE — Telephone Encounter (Signed)
Patient's wife called stating that the patient is still having a lot of pain in his ankle. She would like to know if something could be called in for him to help?  Please advise.

## 2021-06-08 MED ORDER — HYDROCODONE-ACETAMINOPHEN 5-325 MG PO TABS
1.0000 | ORAL_TABLET | Freq: Four times a day (QID) | ORAL | 0 refills | Status: DC | PRN
Start: 2021-06-08 — End: 2021-06-20

## 2021-06-08 NOTE — Telephone Encounter (Signed)
Hydrocodone sent to pharmacy.   Ellard Artis

## 2021-06-08 NOTE — Telephone Encounter (Signed)
Returned call to pt's wife and advised her of hydrocodone rx.  She verbalizes understanding.

## 2021-06-09 NOTE — Progress Notes (Incomplete)
Cardiology Office Note:    Date:  06/09/2021   ID:  Christopher Skinner, DOB Apr 18, 1971, MRN [id number]  PCP:  Carlena Hurl, PA-C  Cardiologist:  None   Referring MD: Carlena Hurl, PA-C   No chief complaint on file.   History of Present Illness:    Christopher Skinner is a 51 y.o. male with a hx of DM II, hyperlipidemia, primary hypertension, OSA, morbid obesity, and abnormal ECG.   ***  Past Medical History:  Diagnosis Date   COVID-19 05/02/2019   Diabetes mellitus without complication ([place]) 4098   GERD (gastroesophageal reflux disease)    H/O exercise stress test 2009   reportedly normal per patient   Hyperlipidemia    Hypertension 2008   Microalbuminuria 2019   Obesity    Sleep apnea    last used CPAP 2014; quit on his own    Past Surgical History:  Procedure Laterality Date   CARPAL TUNNEL RELEASE     left   COLONOSCOPY  2010   normal per patient   COLONOSCOPY WITH PROPOFOL N/A 02/08/2021   Procedure: COLONOSCOPY WITH PROPOFOL;  Surgeon: Gatha Mayer, MD;  [facility];  Service: Endoscopy;  Laterality: N/A;   HAND SURGERY     growth resection, right   POLYPECTOMY  02/08/2021   Procedure: POLYPECTOMY;  Surgeon: Gatha Mayer, MD;  [facility];  Service: Endoscopy;;    Current Medications: No outpatient medications have been marked as taking for the 06/11/21 encounter (Appointment) with Belva Crome, MD.     Allergies:   Metformin and related and Shellfish allergy   Social History   Socioeconomic History   Marital status: Married    Spouse name: Not on file   Number of children: 1   Years of education: Not on file   Highest education level: Not on file  Occupational History   Not on file  Tobacco Use   Smoking status: Never   Smokeless tobacco: Never  Vaping Use   Vaping Use: Never used  Substance and Sexual Activity   Alcohol use: No   Drug use: No   Sexual activity: Not on file  Other Topics Concern    Not on file  Social History Narrative   Married, has 1 daughter, Christopher Skinner, owns catering business, has Quarry manager.   09/2018.     Social Determinants of Health   Financial Resource Strain: Not on file  Food Insecurity: Not on file  Transportation Needs: Not on file  Physical Activity: Not on file  Stress: Not on file  Social Connections: Not on file     Family History: The patient's family history includes Hyperlipidemia in an other family member; Hypertension in his brother, brother, brother, brother, brother, brother, brother, brother, brother, brother, brother, brother, brother, brother, brother, brother, brother, father, mother, sister, sister, sister, sister, sister, sister, sister, sister, sister, and another family member; Obesity in an other family member; Sleep apnea in an other family member; Stomach cancer in his brother; Stroke in his mother. There is no history of Heart disease, Cancer, Diabetes, Colon cancer, Colon polyps, Esophageal cancer, or Rectal cancer.  ROS:   Please see the history of present illness.    *** All other systems reviewed and are negative.  EKGs/Labs/Other Studies Reviewed:    The following studies were reviewed today: CHEST AND ABDOMEN CT 05/2019:  IMPRESSION: 1. Patient's body habitus results in significant photon starvation and incomplete inclusion artifact with multidirectional beam hardening  throughout the chest, abdomen and pelvis. This significantly degrades imaging quality in limits diagnostic utility of this angiographic study. 2. Luminal evaluation of the aorta and branch vessels is severely limited. No gross aneurysmal change or periaortic inflammation is seen. 3. Evaluation of the pulmonary arteries is nondiagnostic. 4. Hepatomegaly. 5. Mild cardiomegaly. 6. Aortic Atherosclerosis (ICD10-I70.0).  EKG:  EKG ***  Recent Labs: 10/31/2020: ALT 24; TSH 2.470 01/08/2021: BUN 17; Creatinine, Ser 1.27; Hemoglobin 12.6; Platelets 377;  Potassium 4.1; Sodium 133  Recent Lipid Panel    Component Value Date/Time   CHOL 214 (H) 10/31/2020 1330   TRIG 122 10/31/2020 1330   HDL 44 10/31/2020 1330   CHOLHDL 4.9 10/31/2020 1330   CHOLHDL 5.2 06/13/2019 0547   VLDL 18 06/13/2019 0547   LDLCALC 148 (H) 10/31/2020 1330    Physical Exam:    VS:  There were no vitals taken for this visit.    Wt Readings from Last 3 Encounters:  06/05/21 (!) 467 lb 12.8 oz (212.2 kg)  05/21/21 (!) 460 lb 12.8 oz (209 kg)  02/08/21 (!) 456 lb (206.8 kg)     GEN: ***. No acute distress HEENT: Normal NECK: No JVD. LYMPHATICS: No lymphadenopathy CARDIAC: *** murmur. RRR *** gallop, or edema. VASCULAR: *** Normal Pulses. No bruits. RESPIRATORY:  Clear to auscultation without rales, wheezing or rhonchi  ABDOMEN: Soft, non-tender, non-distended, No pulsatile mass, MUSCULOSKELETAL: No deformity  SKIN: Warm and dry NEUROLOGIC:  Alert and oriented x 3 PSYCHIATRIC:  Normal affect   ASSESSMENT:    1. Aortic atherosclerosis ([place])   2. Type 2 diabetes mellitus with hyperglycemia, unspecified whether long term insulin use ([place])   3. OSA (obstructive sleep apnea)   4. Essential hypertension, benign   5. Hyperlipidemia, unspecified hyperlipidemia type   6. Morbid obesity ([place])    PLAN:    In order of problems listed above:  ***   Medication Adjustments/Labs and Tests Ordered: Current medicines are reviewed at length with the patient today.  Concerns regarding medicines are outlined above.  No orders of the defined types were placed in this encounter.  No orders of the defined types were placed in this encounter.   There are no Patient Instructions on file for this visit.   Signed, Sinclair Grooms, MD  06/09/2021 12:10 PM    [facility]

## 2021-06-11 ENCOUNTER — Ambulatory Visit: Payer: 59 | Admitting: Interventional Cardiology

## 2021-06-11 DIAGNOSIS — I700 Atherosclerosis of aorta: Secondary | ICD-10-CM

## 2021-06-11 DIAGNOSIS — E1165 Type 2 diabetes mellitus with hyperglycemia: Secondary | ICD-10-CM

## 2021-06-11 DIAGNOSIS — G4733 Obstructive sleep apnea (adult) (pediatric): Secondary | ICD-10-CM

## 2021-06-11 DIAGNOSIS — E785 Hyperlipidemia, unspecified: Secondary | ICD-10-CM

## 2021-06-11 DIAGNOSIS — I10 Essential (primary) hypertension: Secondary | ICD-10-CM

## 2021-06-15 ENCOUNTER — Ambulatory Visit
Admission: EM | Admit: 2021-06-15 | Discharge: 2021-06-15 | Disposition: A | Discharge status: Home / Self Care | Payer: 59 | Attending: Urgent Care | Admitting: Urgent Care

## 2021-06-15 ENCOUNTER — Other Ambulatory Visit: Payer: Self-pay

## 2021-06-15 ENCOUNTER — Encounter: Payer: Self-pay | Admitting: Emergency Medicine

## 2021-06-15 DIAGNOSIS — R7303 Prediabetes: Secondary | ICD-10-CM

## 2021-06-15 DIAGNOSIS — J069 Acute upper respiratory infection, unspecified: Secondary | ICD-10-CM

## 2021-06-15 DIAGNOSIS — R0981 Nasal congestion: Secondary | ICD-10-CM

## 2021-06-15 MED ORDER — LEVOCETIRIZINE DIHYDROCHLORIDE 5 MG PO TABS: 5.0000 mg | ORAL_TABLET | Freq: Every evening | ORAL | 0 refills | Status: DC

## 2021-06-15 MED ORDER — BENZONATATE 100 MG PO CAPS: 100.0000 mg | ORAL_CAPSULE | Freq: Three times a day (TID) | ORAL | 0 refills | Status: DC | PRN

## 2021-06-15 MED ORDER — PROMETHAZINE-DM 6.25-15 MG/5ML PO SYRP: 5.0000 mL | ORAL_SOLUTION | Freq: Every evening | ORAL | 0 refills | Status: DC | PRN

## 2021-06-15 MED ORDER — PREDNISONE 20 MG PO TABS: ORAL_TABLET | ORAL | 0 refills | Status: DC

## 2021-06-15 MED ORDER — IPRATROPIUM BROMIDE 0.03 % NA SOLN: 2.0000 | Freq: Two times a day (BID) | NASAL | 0 refills | Status: DC

## 2021-06-15 NOTE — ED Provider Notes (Signed)
[place]   MRN: [id number] DOB: 11-22-1970  Subjective:   Christopher Skinner is a 51 y.o. male presenting for 5-day history of persistent severe sinus congestion, sinus pressure.  Has also had sinus headaches and a cough that elicits shortness of breath.  No fever, facial pain, sinus pain, ear pain, throat pain, chest pain.  No history of asthma.  Patient states that he is a prediabetic not a full-blown diabetic.  Does not take insulin.  Would like to have steroids as he is usually responded very well to this.  Also wants respiratory testing.  No current facility-administered medications for this encounter.  Current Outpatient Medications:    albuterol (PROVENTIL HFA;VENTOLIN HFA) 108 (90 Base) MCG/ACT inhaler, Inhale 2 puffs into the lungs every 4 (four) hours as needed for wheezing or shortness of breath., Disp: 1 Inhaler, Rfl: 0   aspirin EC 325 MG tablet, Take 1 tablet (325 mg total) by mouth daily., Disp: 100 tablet, Rfl: 3   atorvastatin (LIPITOR) 80 MG tablet, Take 1 tablet (80 mg total) by mouth daily., Disp: 90 tablet, Rfl: 3   blood glucose meter kit and supplies KIT, Dispense based on patient and insurance preference. Use 1-2 times daily as directed. (FOR ICD-10 DE11.69)., Disp: 1 each, Rfl: 0   Blood Glucose Monitoring Suppl (TRUE METRIX METER) DEVI, 1 Device by Does not apply route daily., Disp: 1 each, Rfl: 0   carvedilol (COREG) 12.5 MG tablet, Take 1 tablet (12.5 mg total) by mouth 2 (two) times daily with a meal., Disp: 180 tablet, Rfl: 3   empagliflozin (JARDIANCE) 25 MG TABS tablet, Take 1 tablet (25 mg total) by mouth daily before breakfast., Disp: 90 tablet, Rfl: 3   glucose blood test strip, True metrix strips to test 1-2 times daily, Disp: 100 each, Rfl: 12   HYDROcodone-acetaminophen (NORCO/VICODIN) 5-325 MG tablet, Take 1 tablet by mouth every 6 (six) hours as needed., Disp: 15 tablet, Rfl: 0   Lancets MISC, Use as instructed. Needs lancets based on  pt's meter (true metrix), Disp: 100 each, Rfl: 12   losartan-hydrochlorothiazide (HYZAAR) 100-12.5 MG tablet, Take 1 tablet by mouth daily., Disp: 90 tablet, Rfl: 3   RABEprazole (ACIPHEX) 20 MG tablet, Take 1 tablet (20 mg total) by mouth daily., Disp: 90 tablet, Rfl: 3   Semaglutide, 2 MG/DOSE, (OZEMPIC, 2 MG/DOSE,) 8 MG/3ML SOPN, Inject 2 mg into the skin every Thursday., Disp: 9 mL, Rfl: 3   vitamin B-12 (CYANOCOBALAMIN) 1000 MCG tablet, Take 1 tablet (1,000 mcg total) by mouth daily., Disp: 90 tablet, Rfl: 3   Allergies  Allergen Reactions   Metformin And Related     Bad loose stools even at low doses    Shellfish Allergy Nausea And Vomiting    Past Medical History:  Diagnosis Date   COVID-19 05/02/2019   Diabetes mellitus without complication ([place]) 8366   GERD (gastroesophageal reflux disease)    H/O exercise stress test 2009   reportedly normal per patient   Hyperlipidemia    Hypertension 2008   Microalbuminuria 2019   Obesity    Sleep apnea    last used CPAP 2014; quit on his own     Past Surgical History:  Procedure Laterality Date   CARPAL TUNNEL RELEASE     left   COLONOSCOPY  2010   normal per patient   COLONOSCOPY WITH PROPOFOL N/A 02/08/2021   Procedure: COLONOSCOPY WITH PROPOFOL;  Surgeon: Gatha Mayer, MD;  [facility];  Service:  Endoscopy;  Laterality: N/A;   HAND SURGERY     growth resection, right   POLYPECTOMY  02/08/2021   Procedure: POLYPECTOMY;  Surgeon: Gatha Mayer, MD;  [facility];  Service: Endoscopy;;    Family History  Problem Relation Age of Onset   Stroke Mother    Hypertension Mother    Hypertension Father    Hypertension Sister    Hypertension Sister    Hypertension Sister    Hypertension Sister    Hypertension Sister    Hypertension Sister    Hypertension Sister    Hypertension Sister    Hypertension Sister    Hypertension Brother    Hypertension Brother    Hypertension Brother    Hypertension  Brother    Stomach cancer Brother    Hypertension Brother    Hypertension Brother    Hypertension Brother    Hypertension Brother    Hypertension Brother    Hypertension Brother    Hypertension Brother    Hypertension Brother    Hypertension Brother    Hypertension Brother    Hypertension Brother    Hypertension Brother    Hypertension Brother    Hyperlipidemia Other    Hypertension Other    Sleep apnea Other    Obesity Other    Heart disease Neg Hx    Cancer Neg Hx    Diabetes Neg Hx    Colon cancer Neg Hx    Colon polyps Neg Hx    Esophageal cancer Neg Hx    Rectal cancer Neg Hx     Social History   Tobacco Use   Smoking status: Never   Smokeless tobacco: Never  Vaping Use   Vaping Use: Never used  Substance Use Topics   Alcohol use: No   Drug use: No    ROS   Objective:   Vitals: BP 139/84 (BP Location: Right Arm)    Pulse 84    Temp 98.1 F (36.7 C) (Oral)    Resp 20    Ht _0  (1.753 m)    Wt (!) 456 lb (206.8 kg)    SpO2 95%    BMI 67.34 kg/m   Physical Exam Constitutional:      General: He is not in acute distress.    Appearance: Normal appearance. He is well-developed. He is obese. He is not ill-appearing, toxic-appearing or diaphoretic.  HENT:     Head: Normocephalic and atraumatic.     Right Ear: Tympanic membrane, ear canal and external ear normal. There is no impacted cerumen.     Left Ear: Tympanic membrane, ear canal and external ear normal. There is no impacted cerumen.     Nose: Congestion present. No rhinorrhea.     Comments: Nasal mucosa erythematous and dry.    Mouth/Throat:     Mouth: Mucous membranes are moist.     Pharynx: No oropharyngeal exudate or posterior oropharyngeal erythema.  Eyes:     General: No scleral icterus.       Right eye: No discharge.        Left eye: No discharge.     Extraocular Movements: Extraocular movements intact.     Conjunctiva/sclera: Conjunctivae normal.  Cardiovascular:     Rate and Rhythm:  Normal rate and regular rhythm.     Heart sounds: Normal heart sounds. No murmur heard.   No friction rub. No gallop.  Pulmonary:     Effort: Pulmonary effort is normal. No respiratory distress.  Breath sounds: Normal breath sounds. No stridor. No wheezing, rhonchi or rales.  Musculoskeletal:     Cervical back: Normal range of motion and neck supple. No rigidity. No muscular tenderness.  Skin:    General: Skin is warm and dry.  Neurological:     General: No focal deficit present.     Mental Status: He is alert and oriented to person, place, and time.  Psychiatric:        Mood and Affect: Mood normal.        Behavior: Behavior normal.        Thought Content: Thought content normal.        Judgment: Judgment normal.    Assessment and Plan :   PDMP not reviewed this encounter.  1. Viral URI with cough   2. Sinus congestion   3. Pre-diabetes    Deferred imaging given clear cardiopulmonary exam, hemodynamically stable vital signs.  Patient requested aggressive management and therefore will use an oral prednisone course.  COVID and flu test pending.  We will otherwise manage for viral upper respiratory infection.  Physical exam findings reassuring and vital signs stable for discharge. Advised supportive care, offered symptomatic relief. Counseled patient on potential for adverse effects with medications prescribed/recommended today, ER and return-to-clinic precautions discussed, patient verbalized understanding.      Jaynee Eagles, [place] 06/15/21 [phone]

## 2021-06-15 NOTE — ED Triage Notes (Signed)
Pt reports head congestion and headache x5 days. Pt denies any known fevers and no improvement of symptoms with otc medication.

## 2021-06-16 LAB — COVID-19, FLU A+B NAA
Influenza A, NAA: NOT DETECTED
Influenza B, NAA: NOT DETECTED
SARS-CoV-2, NAA: NOT DETECTED

## 2021-06-20 ENCOUNTER — Encounter: Payer: Self-pay | Admitting: Neurology

## 2021-06-20 ENCOUNTER — Ambulatory Visit: Payer: 59 | Admitting: Neurology

## 2021-06-20 VITALS — BP 156/86 | HR 75 | Ht 69.0 in | Wt >= 6400 oz

## 2021-06-20 DIAGNOSIS — R200 Anesthesia of skin: Secondary | ICD-10-CM

## 2021-06-20 DIAGNOSIS — M542 Cervicalgia: Secondary | ICD-10-CM | POA: Diagnosis not present

## 2021-06-20 NOTE — Progress Notes (Signed)
Chief Complaint  Patient presents with   New Patient (Initial Visit)    Room 14 with with, Christopher Skinner. Reports intermittent episodes of right arm numbness. Concerned he may be having TIA event. Takes aspirin 68m as needed.       ASSESSMENT AND PLAN  MPRANAY HILBUNis a 51y.o. male   Long history of gradual worsening right hand paresthesia, with mild right neck pain,  CT of cervical spine in September 2022 showed multilevel degenerative changes, with bridging osteophyte at the ventrolateral area, diffuse idiopathic skeletal microsteatosis, chronic deformity of the anterior first left rib  History is most suggestive of right cervical radiculopathy,  Will attempt MRI of cervical spine,(weight 470 pounds)  EMG nerve conduction study  DIAGNOSTIC DATA (LABS, IMAGING, TESTING) - I reviewed patient records, labs, notes, testing and imaging myself where available.  I personally reviewed CT angiogram of head and neck neck with and without contrast in September 2022,  Evidence of intracranial atherosclerotic disease, no significant cervical large vessel disease   Skeleton: No acute bony abnormality or aggressive osseous lesion. Cervical and upper thoracic spondylosis. Multilevel ventrolateral bridging osteophytes within the visualized upper thoracic spine with relatively preserved intervertebral disc spaces, a finding which may be seen in the setting of diffuse idiopathic skeletal hyperostosis (DISH). Chronic deformity of the anterior left first rib.   Other neck: Patient body habitus results in significant image noise at the level of the lower neck, significantly limiting evaluation at this level. Within this limitation, there is no appreciable neck mass or cervical lymphadenopathy.  MEDICAL HISTORY:  Christopher Skinner is a 51year old right-handed male, seen in request by his primary care PA Tysinger, DCamelia Eng for evaluation of right arm paresthesia, initial evaluation was  on June 20, 2021 with Christopher Skinner.  I reviewed and summarized the referring note. PMhx. DM HTN HLD Obesity History of bilateral carpal tunnel syndrome release surgery  He has not worked since 2012, began to notice intermittent right arm paresthesia since fall of 2022, he denies any triggers, describes radiating discomfort from right neck, to right shoulder, and right dorsal arm, he denies right arm weakness  He denies involvement of right face, right lower extremity, he had morbidly obese, diabetes, denies bilateral feet paresthesia, denies gait abnormality, bowel or bladder incontinence  He actually had a imaging study concerning for possible TIA in September 2022,  Personally reviewed CT angiogram of the brain and neck with without contrast in April 2022, there was no significant cervical large vessel disease, evidence of intracranial atherosclerotic disease, in addition there is evidence of cervical spondylosis, suggesting diffuse idiopathic skeletal hyperostosis, multilevel ventrolateral bridging osteophyte    PHYSICAL EXAM:   Vitals:   06/20/21 0730  Weight: (!) 470 lb (213.2 kg)  Height: _0  (1.753 m)   Not recorded     Body mass index is 69.41 kg/m.  PHYSICAL EXAMNIATION:  Gen: NAD, conversant, well nourised, well groomed                     Cardiovascular: Regular rate rhythm, no peripheral edema, warm, nontender. Eyes: Conjunctivae clear without exudates or hemorrhage Neck: Supple, no carotid bruits. Pulmonary: Clear to auscultation bilaterally   NEUROLOGICAL EXAM:  MENTAL STATUS: Speech:    Speech is normal; fluent and spontaneous with normal comprehension.  Cognition:     Orientation to time, place and person     Normal recent and remote memory     Normal Attention span and concentration  Normal Language, naming, repeating,spontaneous speech     Fund of knowledge   CRANIAL NERVES: CN II: Visual fields are full to confrontation. Pupils are round  equal and briskly reactive to light. CN III, IV, VI: extraocular movement are normal. No ptosis. CN V: Facial sensation is intact to light touch CN VII: Face is symmetric with normal eye closure  CN VIII: Hearing is normal to causal conversation. CN IX, X: Phonation is normal. CN XI: Head turning and shoulder shrug are intact  MOTOR: There is no pronator drift of out-stretched arms. Muscle bulk and tone are normal. Muscle strength is normal.  REFLEXES: Reflexes are 2+ and symmetric at the biceps, triceps, knees, and trace at ankles. Plantar responses are flexor.  SENSORY: Intact to light touch, pinprick and vibratory sensation are intact in fingers and toes.  COORDINATION: There is no trunk or limb dysmetria noted.  GAIT/STANCE: Need push-up to get up from seated position, limited by his big body habitus  REVIEW OF SYSTEMS:  Full 14 system review of systems performed and notable only for as above All other review of systems were negative.   ALLERGIES: Allergies  Allergen Reactions   Metformin And Related     Bad loose stools even at low doses    Shellfish Allergy Nausea And Vomiting    HOME MEDICATIONS: Current Outpatient Medications  Medication Sig Dispense Refill   albuterol (PROVENTIL HFA;VENTOLIN HFA) 108 (90 Base) MCG/ACT inhaler Inhale 2 puffs into the lungs every 4 (four) hours as needed for wheezing or shortness of breath. 1 Inhaler 0   aspirin EC 81 MG tablet Take 81 mg by mouth as needed. Swallow whole.     atorvastatin (LIPITOR) 80 MG tablet Take 1 tablet (80 mg total) by mouth daily. 90 tablet 3   blood glucose meter kit and supplies KIT Dispense based on patient and insurance preference. Use 1-2 times daily as directed. (FOR ICD-10 DE11.69). 1 each 0   Blood Glucose Monitoring Suppl (TRUE METRIX METER) DEVI 1 Device by Does not apply route daily. 1 each 0   carvedilol (COREG) 12.5 MG tablet Take 1 tablet (12.5 mg total) by mouth 2 (two) times daily with a  meal. 180 tablet 3   empagliflozin (JARDIANCE) 25 MG TABS tablet Take 1 tablet (25 mg total) by mouth daily before breakfast. 90 tablet 3   glucose blood test strip True metrix strips to test 1-2 times daily 100 each 12   Lancets MISC Use as instructed. Needs lancets based on pt's meter (true metrix) 100 each 12   losartan-hydrochlorothiazide (HYZAAR) 100-12.5 MG tablet Take 1 tablet by mouth daily. 90 tablet 3   RABEprazole (ACIPHEX) 20 MG tablet Take 1 tablet (20 mg total) by mouth daily. 90 tablet 3   Semaglutide, 2 MG/DOSE, (OZEMPIC, 2 MG/DOSE,) 8 MG/3ML SOPN Inject 2 mg into the skin every Thursday. 9 mL 3   vitamin B-12 (CYANOCOBALAMIN) 1000 MCG tablet Take 1 tablet (1,000 mcg total) by mouth daily. 90 tablet 3   No current facility-administered medications for this visit.    PAST MEDICAL HISTORY: Past Medical History:  Diagnosis Date   COVID-19 05/02/2019   Diabetes mellitus without complication ([place]) 9476   GERD (gastroesophageal reflux disease)    H/O exercise stress test 2009   reportedly normal per patient   Hyperlipidemia    Hypertension 2008   Microalbuminuria 2019   Obesity    Sleep apnea    last used CPAP 2014; quit on his own  PAST SURGICAL HISTORY: Past Surgical History:  Procedure Laterality Date   CARPAL TUNNEL RELEASE     left   COLONOSCOPY  2010   normal per patient   COLONOSCOPY WITH PROPOFOL N/A 02/08/2021   Procedure: COLONOSCOPY WITH PROPOFOL;  Surgeon: Gatha Mayer, MD;  [facility];  Service: Endoscopy;  Laterality: N/A;   HAND SURGERY     growth resection, right   POLYPECTOMY  02/08/2021   Procedure: POLYPECTOMY;  Surgeon: Gatha Mayer, MD;  [facility];  Service: Endoscopy;;    FAMILY HISTORY: Family History  Problem Relation Age of Onset   Stroke Mother    Hypertension Mother    Hypertension Father    Hypertension Sister    Hypertension Sister    Hypertension Sister    Hypertension Sister    Hypertension  Sister    Hypertension Sister    Hypertension Sister    Hypertension Sister    Hypertension Sister    Hypertension Brother    Hypertension Brother    Hypertension Brother    Hypertension Brother    Stomach cancer Brother    Hypertension Brother    Hypertension Brother    Hypertension Brother    Hypertension Brother    Hypertension Brother    Hypertension Brother    Hypertension Brother    Hypertension Brother    Hypertension Brother    Hypertension Brother    Hypertension Brother    Hypertension Brother    Hypertension Brother    Hyperlipidemia Other    Hypertension Other    Sleep apnea Other    Obesity Other    Heart disease Neg Hx    Cancer Neg Hx    Diabetes Neg Hx    Colon cancer Neg Hx    Colon polyps Neg Hx    Esophageal cancer Neg Hx    Rectal cancer Neg Hx     SOCIAL HISTORY: Social History   Socioeconomic History   Marital status: Married    Spouse name: Not on file   Number of children: 1   Years of education: 12   Highest education level: High school graduate  Occupational History   Occupation: Self-employed  Tobacco Use   Smoking status: Never   Smokeless tobacco: Never  Vaping Use   Vaping Use: Never used  Substance and Sexual Activity   Alcohol use: No   Drug use: No   Sexual activity: Not on file  Other Topics Concern   Not on file  Social History Narrative   Married, has 1 daughter, [name], owns catering business, has Quarry manager.      Right-handed.   No daily caffeine.    Social Determinants of Health   Financial Resource Strain: Not on file  Food Insecurity: Not on file  Transportation Needs: Not on file  Physical Activity: Not on file  Stress: Not on file  Social Connections: Not on file  Intimate Partner Violence: Not on file      Marcial Pacas, M.D. Ph.D.  [facility] [address], [place], [place] [address] Ph: [phone] Fax: [phone]  CC:  Tysinger, [name], PA-C [address] [place],  [place] [address]  Tysinger, [name], PA-C

## 2021-06-21 ENCOUNTER — Telehealth: Payer: Self-pay | Admitting: Neurology

## 2021-06-21 NOTE — Telephone Encounter (Signed)
[employer] pending faxed notes  °

## 2021-06-27 NOTE — Telephone Encounter (Signed)
[employer]: [id number] (exp. 06/21/21 to 09/18/21) order sent to [facility], theyw ill reach out to the patient to schedule.  ?

## 2021-06-28 ENCOUNTER — Telehealth: Payer: Self-pay

## 2021-06-28 NOTE — Telephone Encounter (Signed)
Pt called to advise that his insurance will not cover the weight loss surgery. He was advised of this after taking the classes at [facility]. Pt was offered an appointment with you to go over other options and also advised to call the insurance company to find out where he can be seen for weight loss surgery. Please advise what you would like the pt to do. [place] ?

## 2021-07-01 NOTE — Progress Notes (Signed)
Cardiology Office Note:    Date:  07/02/2021   ID:  Christopher Skinner, DOB 1970/07/18, MRN [id number]  PCP:  Jac Canavan, PA-C  Cardiologist:  None   Referring MD: Jac Canavan, PA-C   Chief Complaint  Patient presents with   Follow-up    Aortic atherosclerosis Diabetes mellitus type 2 Primary hypertension Super morbid obesity Hyperlipidemia Struct of sleep    History of Present Illness:    Christopher Skinner is a 51 y.o. male with a hx of OSA, super morbid obesity, DM II, primary hypertension, abnormal ECG, and aortic atherosclerosis.   The patient has limited exertional capacity related to super morbid obesity.  Within his life activities, he denies chest discomfort, orthopnea, PND, lower extremity swelling, neurological symptoms, and syncope.  He has been enrolled in the [facility] b[facility]ric program.  After going through the program he was eventually told that his insurance would not cover bariatric surgery.  He is very depressed about this.  He has sleep apnea.  He was unable to tolerate CPAP.  Past Medical History:  Diagnosis Date   COVID-19 05/02/2019   Diabetes mellitus without complication (HCC) 2019   GERD (gastroesophageal reflux disease)    H/O exercise stress test 2009   reportedly normal per patient   Hyperlipidemia    Hypertension 2008   Microalbuminuria 2019   Obesity    Sleep apnea    last used CPAP 2014; quit on his own    Past Surgical History:  Procedure Laterality Date   CARPAL TUNNEL RELEASE     left   COLONOSCOPY  2010   normal per patient   COLONOSCOPY WITH PROPOFOL N/A 02/08/2021   Procedure: COLONOSCOPY WITH PROPOFOL;  Surgeon: Iva Boop, MD;  Location: [facility];  Service: Endoscopy;  Laterality: N/A;   HAND SURGERY     growth resection, right   POLYPECTOMY  02/08/2021   Procedure: POLYPECTOMY;  Surgeon: Iva Boop, MD;  Location: [facility];  Service: Endoscopy;;     Current Medications: Current Meds  Medication Sig   albuterol (PROVENTIL HFA;VENTOLIN HFA) 108 (90 Base) MCG/ACT inhaler Inhale 2 puffs into the lungs every 4 (four) hours as needed for wheezing or shortness of breath.   aspirin EC 81 MG tablet Take 81 mg by mouth as needed. Swallow whole.   atorvastatin (LIPITOR) 80 MG tablet Take 1 tablet (80 mg total) by mouth daily.   blood glucose meter kit and supplies KIT Dispense based on patient and insurance preference. Use 1-2 times daily as directed. (FOR ICD-10 DE11.69).   Blood Glucose Monitoring Suppl (TRUE METRIX METER) DEVI 1 Device by Does not apply route daily.   carvedilol (COREG) 12.5 MG tablet Take 1 tablet (12.5 mg total) by mouth 2 (two) times daily with a meal.   empagliflozin (JARDIANCE) 25 MG TABS tablet Take 1 tablet (25 mg total) by mouth daily before breakfast.   glucose blood test strip True metrix strips to test 1-2 times daily   Lancets MISC Use as instructed. Needs lancets based on pt's meter (true metrix)   losartan-hydrochlorothiazide (HYZAAR) 100-12.5 MG tablet Take 1 tablet by mouth daily.   RABEprazole (ACIPHEX) 20 MG tablet Take 1 tablet (20 mg total) by mouth daily.   Semaglutide, 2 MG/DOSE, (OZEMPIC, 2 MG/DOSE,) 8 MG/3ML SOPN Inject 2 mg into the skin every Thursday.   vitamin B-12 (CYANOCOBALAMIN) 1000 MCG tablet Take 1 tablet (1,000 mcg total) by mouth daily.  Allergies:   Metformin and related and Shellfish allergy   Social History   Socioeconomic History   Marital status: Married    Spouse name: Not on file   Number of children: 1   Years of education: 12   Highest education level: High school graduate  Occupational History   Occupation: Self-employed  Tobacco Use   Smoking status: Never   Smokeless tobacco: Never  Vaping Use   Vaping Use: Never used  Substance and Sexual Activity   Alcohol use: No   Drug use: No   Sexual activity: Not on file  Other Topics Concern   Not on file  Social  History Narrative   Married, has 1 daughter, [name], owns catering business, has Quarry manager.      Right-handed.   No daily caffeine.    Social Determinants of Health   Financial Resource Strain: Not on file  Food Insecurity: Not on file  Transportation Needs: Not on file  Physical Activity: Not on file  Stress: Not on file  Social Connections: Not on file     Family History: The patient's family history includes Hyperlipidemia in an other family member; Hypertension in his brother, brother, brother, brother, brother, brother, brother, brother, brother, brother, brother, brother, brother, brother, brother, brother, brother, father, mother, sister, sister, sister, sister, sister, sister, sister, sister, sister, and another family member; Obesity in an other family member; Sleep apnea in an other family member; Stomach cancer in his brother; Stroke in his mother. There is no history of Heart disease, Cancer, Diabetes, Colon cancer, Colon polyps, Esophageal cancer, or Rectal cancer.  ROS:   Please see the history of present illness.    Had COVID-19.  During that time he stopped his medications.  His metabolic parameters got out over order.  He was having diarrhea with one of the medications that he was taking and therefore stopped all of his therapies.  The diarrhea resolved.  He resumed statin therapy, ARB therapy, carvedilol, and is now also on Jardiance.  The diarrhea has not recurred.  All other systems reviewed and are negative.  EKGs/Labs/Other Studies Reviewed:    The following studies were reviewed today: CT angio chest and abdomen February 2021: IMPRESSION: 1. Patient's body habitus results in significant photon starvation and incomplete inclusion artifact with multidirectional beam hardening throughout the chest, abdomen and pelvis. This significantly degrades imaging quality in limits diagnostic utility of this angiographic study. 2. Luminal evaluation of the aorta and  branch vessels is severely limited. No gross aneurysmal change or periaortic inflammation is seen. 3. Evaluation of the pulmonary arteries is nondiagnostic. 4. Hepatomegaly. 5. Mild cardiomegaly. 6. Aortic Atherosclerosis (ICD10-I70.0).   EKG:  EKG NSR with long 1 degree AVB.  Recent Labs: 10/31/2020: ALT 24; TSH 2.470 01/08/2021: BUN 17; Creatinine, Ser 1.27; Hemoglobin 12.6; Platelets 377; Potassium 4.1; Sodium 133  Recent Lipid Panel    Component Value Date/Time   CHOL 214 (H) 10/31/2020 1330   TRIG 122 10/31/2020 1330   HDL 44 10/31/2020 1330   CHOLHDL 4.9 10/31/2020 1330   CHOLHDL 5.2 06/13/2019 0547   VLDL 18 06/13/2019 0547   LDLCALC 148 (H) 10/31/2020 1330    Physical Exam:    VS:  BP 122/70    Pulse (!) 104    Ht $R'5\' 9"'pH$  (1.753 m)    Wt (!) 465 lb 6.4 oz (211.1 kg)    SpO2 99%    BMI 68.73 kg/m     Wt Readings from Last 3  Encounters:  07/02/21 (!) 465 lb 6.4 oz (211.1 kg)  06/20/21 (!) 470 lb (213.2 kg)  06/15/21 (!) 456 lb (206.8 kg)     GEN: Morbid obesity. No acute distress HEENT: Normal NECK: No JVD. LYMPHATICS: No lymphadenopathy CARDIAC: No murmur. RRR no gallop, or edema. VASCULAR:  Normal Pulses. No bruits. RESPIRATORY:  Clear to auscultation without rales, wheezing or rhonchi  ABDOMEN: Soft, non-tender, non-distended, No pulsatile mass, MUSCULOSKELETAL: No deformity  SKIN: Warm and dry NEUROLOGIC:  Alert and oriented x 3 PSYCHIATRIC:  Normal affect   ASSESSMENT:    1. Aortic atherosclerosis ([place])   2. Essential hypertension, benign   3. Mixed hyperlipidemia   4. Morbid obesity ([place])   5. Type 2 diabetes mellitus with hyperglycemia, without long-term current use of insulin (HCC)   6. OSA (obstructive sleep apnea)   7. EKG abnormalities    PLAN:    In order of problems listed above:  Long discussion concerning secondary prevention.  Encouraged 150 minutes of moderate activity per week.  He has limitations relative to physical activity than  orthopedic. Excellent blood pressure control on current therapy.  Target 130/80 mmHg or less.  Has not yet had medications this morning but has good blood pressure readings. Continue statin therapy with atorvastatin 80 mg/day.  Needs to have a lipid panel done to document the most recent LDL level.  The most recent LDL in July 2022 was 148.  Atorvastatin was started/resumed shortly thereafter. Needs bariatric surgery for remission of multiple comorbidities including diabetes, hypertension, hyperlipidemia, and sleep apnea. Continue Ozempic and Jardiance. Consider alternative therapies for sleep apnea since he is unable to use CPAP. No significant abnormalities.   Overall education and awareness concerning secondary risk prevention was discussed in detail: LDL less than 70, hemoglobin A1c less than 7, blood pressure target less than 130/80 mmHg, >150 minutes of moderate aerobic activity per week, avoidance of smoking, weight control (via diet and exercise), and continued surveillance/management of/for obstructive sleep apnea.    Medication Adjustments/Labs and Tests Ordered: Current medicines are reviewed at length with the patient today.  Concerns regarding medicines are outlined above.  No orders of the defined types were placed in this encounter.  No orders of the defined types were placed in this encounter.   There are no Patient Instructions on file for this visit.   Signed, Sinclair Grooms, MD  07/02/2021 9:30 AM    [place]

## 2021-07-02 ENCOUNTER — Encounter: Payer: Self-pay | Admitting: Interventional Cardiology

## 2021-07-02 ENCOUNTER — Other Ambulatory Visit: Payer: Self-pay

## 2021-07-02 ENCOUNTER — Ambulatory Visit (INDEPENDENT_AMBULATORY_CARE_PROVIDER_SITE_OTHER): Payer: 59 | Admitting: Interventional Cardiology

## 2021-07-02 VITALS — BP 122/70 | HR 104 | Ht 69.0 in | Wt >= 6400 oz

## 2021-07-02 DIAGNOSIS — E782 Mixed hyperlipidemia: Secondary | ICD-10-CM | POA: Diagnosis not present

## 2021-07-02 DIAGNOSIS — I700 Atherosclerosis of aorta: Secondary | ICD-10-CM

## 2021-07-02 DIAGNOSIS — R9431 Abnormal electrocardiogram [ECG] [EKG]: Secondary | ICD-10-CM

## 2021-07-02 DIAGNOSIS — I10 Essential (primary) hypertension: Secondary | ICD-10-CM

## 2021-07-02 DIAGNOSIS — G4733 Obstructive sleep apnea (adult) (pediatric): Secondary | ICD-10-CM

## 2021-07-02 DIAGNOSIS — E1165 Type 2 diabetes mellitus with hyperglycemia: Secondary | ICD-10-CM

## 2021-07-02 NOTE — Patient Instructions (Signed)
Medication Instructions:  ?Your physician recommends that you continue on your current medications as directed. Please refer to the Current Medication list given to you today. ? ?*If you need a refill on your cardiac medications before your next appointment, please call your pharmacy* ? ? ?Lab Work: ?None today ?If you have labs (blood work) drawn today and your tests are completely normal, you will receive your results only by: ?MyChart Message (if you have MyChart) OR ?A paper copy in the mail ?If you have any lab test that is abnormal or we need to change your treatment, we will call you to review the results. ? ? ?Testing/Procedures: ?None today ? ? ?Follow-Up: ?At [facility], you and your health needs are our priority.  As part of our continuing mission to provide you with exceptional heart care, we have created designated Provider Care Teams.  These Care Teams include your primary Cardiologist (physician) and Advanced Practice Providers (APPs -  Physician Assistants and Nurse Practitioners) who all work together to provide you with the care you need, when you need it. ? ?We recommend signing up for the patient portal called "MyChart".  Sign up information is provided on this After Visit Summary.  MyChart is used to connect with patients for Virtual Visits (Telemedicine).  Patients are able to view lab/test results, encounter notes, upcoming appointments, etc.  Non-urgent messages can be sent to your provider as well.   ?To learn more about what you can do with MyChart, go to [email or web address].   ? ?Your next appointment:   ?Follow up as needed. ? ? ?Provider:   ?Belva Crome III MD  ? ? ?  ?

## 2021-07-02 NOTE — Progress Notes (Signed)
? ?  I, [name], LAT, ATC, am serving as scribe for Dr. Lynne Leader. ? ?Christopher Skinner is a 51 y.o. male who presents to [place] at [facility] today for f/u of L ankle pain due to a fibular avulsion fracture.  He was last seen by Dr. Georgina Snell on 06/05/21 and was advised to use an ankle brace and to work on his AROM.  Today, pt reports slight improvement in the pain in his L ankle. Pt notes increased pain w/ PF/DF. Pt has been compliant w/ wearing the ankle brace, but didn't put in on this morning seen he had a visit. ? ?Diagnostic testing: L ankle XR- 06/05/21 ? ?Pertinent review of systems: No fevers or chills ? ?Relevant historical information: Morbid obesity ? ? ?Exam:  ?BP 128/86   Pulse 97   Ht '5\' 9"'$  (1.753 m)   Wt (!) 467 lb (211.8 kg)   SpO2 96%   BMI 68.96 kg/m?  ?General: Well Developed, well nourished, and in no acute distress.  ? ?MSK: Left ankle: Tender palpation ATFL region. ?Decreased ankle motion. ?Pain with foot inversion and resisted foot eversion. ?Pulses capillary fill and sensation are intact distally. ? ? ? ?Lab and Radiology Results ? ?X-ray images left ankle obtained today personally and independently interpreted. ?Previously seen crescent-shaped avulsion fracture at lateral malleolus difficult to visualize today. ?Await formal radiology review ? ? ? ? ?Assessment and Plan: ?51 y.o. male with left ankle sprain/avulsion fracture at ATFL/lateral malleolus. ?Not improving with rest and home exercise program sufficiently.  Plan to continue ASO ankle brace and referred to PT.  He was reasonable to refer back to PT there. ?Recheck 6 weeks. ?Handicap form filled out. ? ? ?PDMP not reviewed this encounter. ?Orders Placed This Encounter  ?Procedures  ? DG Ankle Complete Left  ?  Standing Status:   Future  ?  Number of Occurrences:   1  ?  Standing Expiration Date:   07/04/2022  ?  Order Specific Question:   Reason for Exam (SYMPTOM  OR DIAGNOSIS REQUIRED)  ?  Answer:   left  ankle fx  ?  Order Specific Question:   Preferred imaging location?  ?  Answer:   [facility]  ? Ambulatory referral to Physical Therapy  ?  Referral Priority:   Routine  ?  Referral Type:   Physical Medicine  ?  Referral Reason:   Specialty Services Required  ?  Requested Specialty:   Physical Therapy  ?  Number of Visits Requested:   1  ? ?No orders of the defined types were placed in this encounter. ? ? ? ?Discussed warning signs or symptoms. Please see discharge instructions. Patient expresses understanding. ? ? ?The above documentation has been reviewed and is accurate and complete Lynne Leader, M.D. ? ? ?

## 2021-07-02 NOTE — Telephone Encounter (Signed)
Pt was made an appointment   [place] ?

## 2021-07-03 ENCOUNTER — Ambulatory Visit (INDEPENDENT_AMBULATORY_CARE_PROVIDER_SITE_OTHER): Payer: 59

## 2021-07-03 ENCOUNTER — Ambulatory Visit (INDEPENDENT_AMBULATORY_CARE_PROVIDER_SITE_OTHER): Payer: 59 | Admitting: Family Medicine

## 2021-07-03 ENCOUNTER — Other Ambulatory Visit: Payer: Self-pay

## 2021-07-03 VITALS — BP 128/86 | HR 97 | Ht 69.0 in | Wt >= 6400 oz

## 2021-07-03 DIAGNOSIS — S82892D Other fracture of left lower leg, subsequent encounter for closed fracture with routine healing: Secondary | ICD-10-CM

## 2021-07-03 NOTE — Patient Instructions (Addendum)
Thank you for coming in today.  ? ?I've referred you to Physical Therapy.  Let us know if you don't hear from them in one week.  ? ?Please get an Xray today before you leave  ? ?Recheck back in 6 weeks ?

## 2021-07-04 ENCOUNTER — Encounter: Payer: Self-pay | Admitting: Medical

## 2021-07-04 ENCOUNTER — Ambulatory Visit (INDEPENDENT_AMBULATORY_CARE_PROVIDER_SITE_OTHER): Payer: 59 | Admitting: Medical

## 2021-07-04 VITALS — BP 124/80 | HR 75 | Wt >= 6400 oz

## 2021-07-04 DIAGNOSIS — Z6841 Body Mass Index (BMI) 40.0 and over, adult: Secondary | ICD-10-CM

## 2021-07-04 DIAGNOSIS — R809 Proteinuria, unspecified: Secondary | ICD-10-CM

## 2021-07-04 DIAGNOSIS — R160 Hepatomegaly, not elsewhere classified: Secondary | ICD-10-CM

## 2021-07-04 DIAGNOSIS — I10 Essential (primary) hypertension: Secondary | ICD-10-CM

## 2021-07-04 DIAGNOSIS — E1165 Type 2 diabetes mellitus with hyperglycemia: Secondary | ICD-10-CM

## 2021-07-04 DIAGNOSIS — E782 Mixed hyperlipidemia: Secondary | ICD-10-CM

## 2021-07-04 DIAGNOSIS — G4733 Obstructive sleep apnea (adult) (pediatric): Secondary | ICD-10-CM

## 2021-07-04 NOTE — Patient Instructions (Addendum)
Bariatric Seminar- call [phone] or register for free seminar at [email or web address]   ? ?Once you go through classes they will have you fill out paperwork and then they will schedule you  ? ? ? ?Sample meal plan: ? ?Breakfast ?You may eat 1 of the following ?Omelette, which can include a small amount of cheese, and vegetables such as peppers, mushrooms, small pieces of [place] or chicken ?Low sugar yogurt serving which can include some fruit such as berries ?Egg whites or hard boiled egg and meat (1-2 strips of bacon, or small piece of [place] sausage or [place] bacon) ? ? ? ?Lunch ?A protein source such as 1 of the following: ?1 serving of beans such as black beans, pinto beans, green beans, or edamame (soy beans) ?1 meat serving such as 6 oz or deck of card size serving of fish, skinless chicken, or [place], either grilled or baked preferably.   You can use some pork or beef, but limit this compared to fish, chicken or [place] ?Vegetable - Half of your plate should be a non-starchy vegetables!  So avoid white potatoes and corn.  Otherwise, eat a large portion of vegetables. ?Avocado, cucumber, tomato, carrots, greens, lettuce, squash, okra, etc.  ?Vegetables can include salad with olive oil/vinaigrette dressing ? ? ?Mid-afternoon snack ?1 fruit serving such as one of the following: ?medium-sized apple ?medium-sized orange, ?Tangerine ?1/2 banana  ?3/4 cup of fresh berries or frozen berries ? ?A protein source such as one of the following: ?8 almonds  ?small handful of walnuts or other nuts ?small piece of cheese, ?low sugar yogurt ? ? ?Dinner ?A protein source such as 1 of the following: ?1 serving of beans such as black beans, pinto beans, green beans, or edamame (soy beans) ?1 meat serving such as 6 oz or deck of card size serving of fish, skinless chicken, or [place], either grilled or baked preferably.   You can use some pork or beef, but limit this compared to fish, chicken or [place] ?Vegetable - Half of  your plate should be a non-starchy vegetables!  So avoid white potatoes and corn.  Otherwise, eat a large portion of vegetables. ?Avocado, cucumber, tomato, carrots, greens, lettuce, squash, okra, etc.  ?Vegetables can include salad with olive oil/vinaigrette dressing ? ? ?Beverages: ?Water ?Unsweet tea ?Home made juice with a juicer without sugar added other than small bit of honey or agave nectar ?Water with sugar free flavor such as [place] ? ? ?AVOID.... ?For the time being I want you to cut out the following items completely: ?Soda, sweet tea, juice, beer or wine or alcohol ?ALL grains and breads including rice, pasta, bread, cereal ?Sweets such as cake, candy, pies, chips, cookies, chocolate ?

## 2021-07-04 NOTE — Progress Notes (Signed)
Subjective: ? Christopher Skinner is a 51 y.o. male who presents for ?Chief Complaint  ?Patient presents with  ? weight loss  ?  Weight Loss, needs fasting labs that were put in future. Go over notes from Cardiology from the other day. Needs referral to [place] surgery.   ?   ?Here with wife today to discuss weight. ? ?We had referred to bariatric clinic.  He ended up doing a virtual consult with [facility] bariatric clinic only to be told his insurance was not covered there.  He needs a referral to [facility] surgery here in [place] for bariatric clinic.  He is interested in weight loss surgery. ? ?He has done the healthy weight and wellness program in the past and was in that program for quite a while and at 1 point was going to be referred to bariatric surgery clinic and something fell through and he never got a call back ? ?He is a diabetic, currently on Ozempic 2 mg weekly and Jardiance daily.  He has not really lost a lot of weight despite using those doses of medications.  He tolerates Ozempic and Jardiance just fine.  He does have quite a bit of urination in general on the Jardiance.  No nausea or other side effects of the medication. ? ?He is exercising some.  He recently hurt his ankle and is going to physical therapy for this.  He may be going to the gym every now and then right now due to his visits being busy and the recent injury. ? ?His wife notes that he needs to drink more water. ? ?He denies any major unhealthy habits with diet.  Not eating junk food, not eating huge portions, but he does eat a fair amount of bread with sandwiches.  He eats a lot of vegetables. ? ?On the Ozempic he finds that if he overeats he will throw up or feel not well so he has been using portion control ? ?No other aggravating or relieving factors.   ? ?No other c/o. ? ?Past Medical History:  ?Diagnosis Date  ? COVID-19 05/02/2019  ? Diabetes mellitus without complication ([place]) 4196  ? GERD  (gastroesophageal reflux disease)   ? H/O exercise stress test 2009  ? reportedly normal per patient  ? Hyperlipidemia   ? Hypertension 2008  ? Microalbuminuria 2019  ? Obesity   ? Sleep apnea   ? last used CPAP 2014; quit on his own  ? ?Current Outpatient Medications on File Prior to Visit  ?Medication Sig Dispense Refill  ? albuterol (PROVENTIL HFA;VENTOLIN HFA) 108 (90 Base) MCG/ACT inhaler Inhale 2 puffs into the lungs every 4 (four) hours as needed for wheezing or shortness of breath. 1 Inhaler 0  ? aspirin EC 81 MG tablet Take 81 mg by mouth as needed. Swallow whole.    ? atorvastatin (LIPITOR) 80 MG tablet Take 1 tablet (80 mg total) by mouth daily. 90 tablet 3  ? blood glucose meter kit and supplies KIT Dispense based on patient and insurance preference. Use 1-2 times daily as directed. (FOR ICD-10 DE11.69). 1 each 0  ? Blood Glucose Monitoring Suppl (TRUE METRIX METER) DEVI 1 Device by Does not apply route daily. 1 each 0  ? carvedilol (COREG) 12.5 MG tablet Take 1 tablet (12.5 mg total) by mouth 2 (two) times daily with a meal. 180 tablet 3  ? empagliflozin (JARDIANCE) 25 MG TABS tablet Take 1 tablet (25 mg total) by mouth daily before breakfast.  90 tablet 3  ? glucose blood test strip True metrix strips to test 1-2 times daily 100 each 12  ? Lancets MISC Use as instructed. Needs lancets based on pt's meter (true metrix) 100 each 12  ? losartan-hydrochlorothiazide (HYZAAR) 100-12.5 MG tablet Take 1 tablet by mouth daily. 90 tablet 3  ? RABEprazole (ACIPHEX) 20 MG tablet Take 1 tablet (20 mg total) by mouth daily. 90 tablet 3  ? Semaglutide, 2 MG/DOSE, (OZEMPIC, 2 MG/DOSE,) 8 MG/3ML SOPN Inject 2 mg into the skin every Thursday. 9 mL 3  ? vitamin B-12 (CYANOCOBALAMIN) 1000 MCG tablet Take 1 tablet (1,000 mcg total) by mouth daily. 90 tablet 3  ? ?No current facility-administered medications on file prior to visit.  ? ? ?The following portions of the patient's history were reviewed and updated as  appropriate: allergies, current medications, past family history, past medical history, past social history, past surgical history and problem list. ? ?ROS ?Otherwise as in subjective above ? ? ? ?Objective: ?BP 124/80   Pulse 75   Wt (!) 467 lb 3.2 oz (211.9 kg)   SpO2 99%   BMI 68.99 kg/m?  ? ?General appearance: alert, no distress, well developed, well nourished ? ? ? ?Assessment: ?Encounter Diagnoses  ?Name Primary?  ? Type 2 diabetes mellitus with hyperglycemia, without long-term current use of insulin ([place]) Yes  ? BMI 60.0-69.9, adult ([place])   ? OSA (obstructive sleep apnea)   ? Mixed hyperlipidemia   ? Microalbuminuria   ? Hepatomegaly   ? Essential hypertension, benign   ? ? ? ?Plan: ?Counseled on diet, exercise, water intake, spent a lot of time on specific food recommendations.  We discussed other programs such as weight watchers and [employer].  He already went through the program through healthy weight and wellness clinic and still did not lose weight.  He will continue on Jardiance and Ozempic medications for diabetes and weight control. ? ?We will refer to [facility] surgery for consult.  He has failed other medications for weight loss.  He did not see a significant weight loss with healthy weight wellness clinic.  He is on medicines right now that should help him lose weight but he is not seeing any changes despite medications and diet restrictions. ? ?He could greatly benefit with bariatric surgery given his comorbidities ? ?Continue other medicines as usual ? ?Based on our discussion today he will focus more of his efforts on exercise and water intake ? ? ? ?Christopher Skinner was seen today for weight loss. ? ?Diagnoses and all orders for this visit: ? ?Type 2 diabetes mellitus with hyperglycemia, without long-term current use of insulin ([place]) ?-     Amb Referral to Bariatric Surgery ? ?BMI 60.0-69.9, adult ([place]) ?-     Amb Referral to Bariatric Surgery ? ?OSA (obstructive sleep apnea) ?-     Amb  Referral to Bariatric Surgery ? ?Mixed hyperlipidemia ?-     Amb Referral to Bariatric Surgery ? ?Microalbuminuria ?-     Amb Referral to Bariatric Surgery ? ?Hepatomegaly ?-     Amb Referral to Bariatric Surgery ? ?Essential hypertension, benign ?-     Amb Referral to Bariatric Surgery ? ? ? ?Follow up: pending referral ? ? ? ? ?

## 2021-07-04 NOTE — Progress Notes (Signed)
Ankle x-ray is unchanged to improved compared to prior x-ray last month.

## 2021-07-13 ENCOUNTER — Ambulatory Visit (HOSPITAL_COMMUNITY): Payer: 59

## 2021-07-17 ENCOUNTER — Telehealth: Payer: Self-pay | Admitting: Neurology

## 2021-07-17 ENCOUNTER — Ambulatory Visit: Payer: 59 | Admitting: Neurology

## 2021-07-17 NOTE — Telephone Encounter (Signed)
Pt's appt was today at 8:45, nrv wait was too long for him to stay so he had to leave. There isn't any openings left from now until the 30th. He needs a referral out. ?

## 2021-07-18 ENCOUNTER — Ambulatory Visit (HOSPITAL_COMMUNITY): Payer: 59 | Attending: Family Medicine

## 2021-07-27 ENCOUNTER — Encounter (HOSPITAL_COMMUNITY): Payer: Self-pay

## 2021-07-27 ENCOUNTER — Ambulatory Visit (HOSPITAL_COMMUNITY): Payer: 59

## 2021-08-08 ENCOUNTER — Ambulatory Visit
Admission: EM | Admit: 2021-08-08 | Discharge: 2021-08-08 | Disposition: A | Discharge status: Home / Self Care | Payer: 59 | Attending: Family Medicine | Admitting: Family Medicine

## 2021-08-08 DIAGNOSIS — S161XXA Strain of muscle, fascia and tendon at neck level, initial encounter: Secondary | ICD-10-CM | POA: Diagnosis not present

## 2021-08-08 MED ORDER — CYCLOBENZAPRINE HCL 5 MG PO TABS: 5.0000 mg | ORAL_TABLET | Freq: Three times a day (TID) | ORAL | 0 refills | Status: DC | PRN

## 2021-08-08 MED ORDER — NAPROXEN 500 MG PO TABS: 500.0000 mg | ORAL_TABLET | Freq: Two times a day (BID) | ORAL | 0 refills | Status: DC | PRN

## 2021-08-08 NOTE — ED Triage Notes (Signed)
Pt states the back of his neck has had lots of pain for about 2 days ? ?Pt states he is unable to turn neck from side to side ? ?Denies Meds  ? ?Denies Fever ?

## 2021-08-08 NOTE — ED Provider Notes (Signed)
?[facility] ? ? ? ?CSN: [id number] ?Arrival date & time: 08/08/21  6578 ? ? ?  ? ?History   ?Chief Complaint ?Chief Complaint  ?Patient presents with  ? Neck Pain  ? ? ?HPI ?Christopher Skinner is a 51 y.o. male.  ? ?Presenting today with 2-day history of bilateral upper neck soreness, stiffness after an incident with his freezer door falling on him.  He denies midline sharp neck pain, loss of range of motion, radiation of pain down the arms, numbness, tingling, weakness, headaches.  Has not been trying anything other than ice to the area with no relief. ? ? ?Past Medical History:  ?Diagnosis Date  ? COVID-19 05/02/2019  ? Diabetes mellitus without complication ([place]) 4696  ? GERD (gastroesophageal reflux disease)   ? H/O exercise stress test 2009  ? reportedly normal per patient  ? Hyperlipidemia   ? Hypertension 2008  ? Microalbuminuria 2019  ? Obesity   ? Sleep apnea   ? last used CPAP 2014; quit on his own  ? ? ?Patient Active Problem List  ? Diagnosis Date Noted  ? Neck pain on right side 06/20/2021  ? BMI 60.0-69.9, adult ([place]) 05/21/2021  ? Right arm numbness 01/08/2021  ? Right sided numbness 01/08/2021  ? EKG abnormalities 01/08/2021  ? Vaccine refused by patient 10/31/2020  ? Encounter for health maintenance examination in adult 10/31/2020  ? Anhidrosis 12/21/2019  ? Hepatomegaly 06/23/2019  ? Aortic atherosclerosis ([place]) 06/23/2019  ? Anemia 06/23/2019  ? Chest pain 06/12/2019  ? Cough 06/08/2019  ? Chronic pain of right knee 04/16/2019  ? Screen for colon cancer 10/05/2018  ? Essential hypertension, benign 10/05/2018  ? Hyperlipidemia 10/05/2018  ? Microalbuminuria 10/05/2018  ? Gastroesophageal reflux disease 10/05/2018  ? Vaccine counseling 10/05/2018  ? Type 2 diabetes mellitus with hyperglycemia ([place]) 10/05/2018  ? OSA (obstructive sleep apnea) 10/05/2018  ? Morbid obesity ([place]) 10/26/2012  ? ? ?Past Surgical History:  ?Procedure Laterality Date  ? CARPAL TUNNEL RELEASE    ? left  ?  COLONOSCOPY  2010  ? normal per patient  ? COLONOSCOPY WITH PROPOFOL N/A 02/08/2021  ? Procedure: COLONOSCOPY WITH PROPOFOL;  Surgeon: Gatha Mayer, MD;  Location: [facility];  Service: Endoscopy;  Laterality: N/A;  ? HAND SURGERY    ? growth resection, right  ? POLYPECTOMY  02/08/2021  ? Procedure: POLYPECTOMY;  Surgeon: Gatha Mayer, MD;  Location: [facility];  Service: Endoscopy;;  ? ? ? ? ? ?Home Medications   ? ?Prior to Admission medications   ?Medication Sig Start Date End Date Taking? Authorizing Provider  ?cyclobenzaprine (FLEXERIL) 5 MG tablet Take 1 tablet (5 mg total) by mouth 3 (three) times daily as needed for muscle spasms. Do not drink alcohol or drive while taking this medication.  May cause drowsiness. 08/08/21  Yes Volney American, PA-C  ?naproxen (NAPROSYN) 500 MG tablet Take 1 tablet (500 mg total) by mouth 2 (two) times daily as needed. 08/08/21  Yes Volney American, PA-C  ?albuterol (PROVENTIL HFA;VENTOLIN HFA) 108 (90 Base) MCG/ACT inhaler Inhale 2 puffs into the lungs every 4 (four) hours as needed for wheezing or shortness of breath. 03/22/17   Lacretia Leigh, MD  ?aspirin EC 81 MG tablet Take 81 mg by mouth as needed. Swallow whole.    [provider]  ?atorvastatin (LIPITOR) 80 MG tablet Take 1 tablet (80 mg total) by mouth daily. 05/21/21 05/21/22  Tysinger, [name], PA-C  ?blood glucose meter  kit and supplies KIT Dispense based on patient and insurance preference. Use 1-2 times daily as directed. (FOR ICD-10 DE11.69). 06/08/19   Tysinger, [name], PA-C  ?Blood Glucose Monitoring Suppl (TRUE METRIX METER) DEVI 1 Device by Does not apply route daily. 06/10/19   Tysinger, [name], PA-C  ?carvedilol (COREG) 12.5 MG tablet Take 1 tablet (12.5 mg total) by mouth 2 (two) times daily with a meal. 11/01/20   Tysinger, [name], PA-C  ?empagliflozin (JARDIANCE) 25 MG TABS tablet Take 1 tablet (25 mg total) by mouth daily before breakfast. 11/01/20   Tysinger, [name], PA-C   ?glucose blood test strip True metrix strips to test 1-2 times daily 05/21/21   Tysinger, [name], PA-C  ?Lancets MISC Use as instructed. Needs lancets based on pt's meter (true metrix) 05/21/21   Tysinger, [name], PA-C  ?losartan-hydrochlorothiazide (HYZAAR) 100-12.5 MG tablet Take 1 tablet by mouth daily. 11/01/20   Tysinger, [name], PA-C  ?RABEprazole (ACIPHEX) 20 MG tablet Take 1 tablet (20 mg total) by mouth daily. 11/01/20   Tysinger, [name], PA-C  ?Semaglutide, 2 MG/DOSE, (OZEMPIC, 2 MG/DOSE,) 8 MG/3ML SOPN Inject 2 mg into the skin every Thursday. 05/24/21   Tysinger, [name], PA-C  ?vitamin B-12 (CYANOCOBALAMIN) 1000 MCG tablet Take 1 tablet (1,000 mcg total) by mouth daily. 11/01/20   Tysinger, [name], PA-C  ? ? ?Family History ?Family History  ?Problem Relation Age of Onset  ? Stroke Mother   ? Hypertension Mother   ? Hypertension Father   ? Hypertension Sister   ? Hypertension Sister   ? Hypertension Sister   ? Hypertension Sister   ? Hypertension Sister   ? Hypertension Sister   ? Hypertension Sister   ? Hypertension Sister   ? Hypertension Sister   ? Hypertension Brother   ? Hypertension Brother   ? Hypertension Brother   ? Hypertension Brother   ? Stomach cancer Brother   ? Hypertension Brother   ? Hypertension Brother   ? Hypertension Brother   ? Hypertension Brother   ? Hypertension Brother   ? Hypertension Brother   ? Hypertension Brother   ? Hypertension Brother   ? Hypertension Brother   ? Hypertension Brother   ? Hypertension Brother   ? Hypertension Brother   ? Hypertension Brother   ? Hyperlipidemia Other   ? Hypertension Other   ? Sleep apnea Other   ? Obesity Other   ? Heart disease Neg Hx   ? Cancer Neg Hx   ? Diabetes Neg Hx   ? Colon cancer Neg Hx   ? Colon polyps Neg Hx   ? Esophageal cancer Neg Hx   ? Rectal cancer Neg Hx   ? ? ?Social History ?Social History  ? ?Tobacco Use  ? Smoking status: Never  ?  Passive exposure: Never  ? Smokeless tobacco: Never  ?Vaping Use  ? Vaping Use: Never  used  ?Substance Use Topics  ? Alcohol use: No  ? Drug use: No  ? ? ? ?Allergies   ?Metformin and related and Shellfish allergy ? ? ?Review of Systems ?Review of Systems ?Per HPI ? ?Physical Exam ?Triage Vital Signs ?ED Triage Vitals  ?Enc Vitals Group  ?   BP 08/08/21 1014 (!) 160/111  ?   Pulse Rate 08/08/21 1014 82  ?   Resp 08/08/21 1014 20  ?   Temp 08/08/21 1014 (!) 97.5 ?F (36.4 ?C)  ?   Temp Source 08/08/21 1014  Oral  ?   SpO2 08/08/21 1014 98 %  ?   Weight --   ?   Height --   ?   Head Circumference --   ?   Peak Flow --   ?   Pain Score 08/08/21 1015 9  ?   Pain Loc --   ?   Pain Edu? --   ?   Excl. in [place]? --   ? ?No data found. ? ?Updated Vital Signs ?BP (!) 160/111 (BP Location: Right Arm)   Pulse 82   Temp (!) 97.5 ?F (36.4 ?C) (Oral)   Resp 20   SpO2 98%  ? ?Visual Acuity ?Right Eye Distance:   ?Left Eye Distance:   ?Bilateral Distance:   ? ?Right Eye Near:   ?Left Eye Near:    ?Bilateral Near:    ? ?Physical Exam ?Vitals and nursing note reviewed.  ?Constitutional:   ?   Appearance: He is obese.  ?HENT:  ?   Head: Atraumatic.  ?Eyes:  ?   Extraocular Movements: Extraocular movements intact.  ?   Conjunctiva/sclera: Conjunctivae normal.  ?Cardiovascular:  ?   Rate and Rhythm: Normal rate and regular rhythm.  ?Pulmonary:  ?   Effort: Pulmonary effort is normal.  ?   Breath sounds: Normal breath sounds.  ?Musculoskeletal:     ?   General: Tenderness and signs of injury present. No swelling or deformity. Normal range of motion.  ?   Cervical back: Normal range of motion and neck supple.  ?   Comments: No midline spinal tenderness to palpation diffusely.  Bilateral cervical paraspinal muscles tender to palpation and mild spasm.  Range of motion full and intact though sore to do so.  Upper extremity range of motion intact bilaterally.  Grip strength full and equal bilaterally  ?Skin: ?   General: Skin is warm and dry.  ?Neurological:  ?   General: No focal deficit present.  ?   Mental Status: He is  alert and oriented to person, place, and time.  ?   Sensory: No sensory deficit.  ?   Motor: No weakness.  ?   Gait: Gait normal.  ?Psychiatric:     ?   Mood and Affect: Mood normal.     ?   Thought Content: Thought co

## 2021-08-13 NOTE — Progress Notes (Deleted)
   I, Christopher Skinner, LAT, ATC, am serving as scribe for Dr. Lynne Leader.  Christopher Skinner is a 51 y.o. male who presents to [place] at [facility] today for f/u of L ankle pain due to a fibular avulsion fracture.  He was last seen by Dr. Georgina Snell on 07/03/21 w/ little improvement noted.  He was advised to con't wearing his ankle brace and was referred to PT at [facility] but never completed any visits.  Today, pt reports   Diagnostic testing: L ankle XR- 07/03/21, 06/05/21  Pertinent review of systems: ***  Relevant historical information: ***   Exam:  There were no vitals taken for this visit. General: Well Developed, well nourished, and in no acute distress.   MSK: ***    Lab and Radiology Results No results found for this or any previous visit (from the past 72 hour(s)). No results found.     Assessment and Plan: 51 y.o. male with ***   PDMP not reviewed this encounter. No orders of the defined types were placed in this encounter.  No orders of the defined types were placed in this encounter.    Discussed warning signs or symptoms. Please see discharge instructions. Patient expresses understanding.   ***

## 2021-08-14 ENCOUNTER — Ambulatory Visit: Payer: Medicaid Other | Admitting: Family Medicine

## 2021-08-15 ENCOUNTER — Encounter: Payer: Self-pay | Admitting: *Deleted

## 2021-08-29 ENCOUNTER — Telehealth: Payer: Self-pay

## 2021-08-29 ENCOUNTER — Encounter: Payer: 59 | Admitting: Neurology

## 2021-08-29 NOTE — Telephone Encounter (Signed)
? ?  Pre-operative Risk Assessment  ?  ?Patient Name: Christopher Skinner  ?DOB: 23-Mar-1971 ?MRN: [id number]  ? ?  ? ?Request for Surgical Clearance   ? ?Procedure:   Laparoscopic Gastric Bypass ? ?Date of Surgery:  Clearance TBD                              ?   ?Surgeon:  Gurney Maxin, MD ?Surgeon's Group or Practice Name:  [facility] ?Phone number:  [phone] ?Fax number:  [phone] Emeline Gins, [place]) ?  ?Type of Clearance Requested:   ?- Medical  ?  ?Type of Anesthesia:  General  ?  ?Additional requests/questions:     ? ?Signed, ?Ulice Brilliant T   ?08/29/2021, 1:26 PM  ? ?

## 2021-08-29 NOTE — Telephone Encounter (Signed)
? ?  Patient Name: Christopher Skinner  ?DOB: 03-08-1971 ?MRN: [id number] ? ?Primary Cardiologist: Dr. Daneen Schick ? ?Chart reviewed as part of pre-operative protocol coverage. He had a remote echo in 05/2021 with EF 55-60%, no prior ischemic testing on file. Patient was last seen 06/2021. Per OV, he has limited exertional capacity related to his super morbid obesity therefore we may not be able to clear via traditional pre-op protocol questions - we can still do virtual visit but will need to d/w Dr. Tamala Julian whether he feels any cardiac additional testing would be warranted prior to bariatric surgery given his limited functional capacity. Dr. Tamala Julian - Please route response to P CV DIV PREOP (the pre-op pool). Thank you. ? ? ?Charlie Pitter, PA-C ?08/29/2021, 2:09 PM ? ? ?

## 2021-08-30 ENCOUNTER — Other Ambulatory Visit (HOSPITAL_COMMUNITY): Payer: Self-pay | Admitting: General Surgery

## 2021-08-30 ENCOUNTER — Other Ambulatory Visit: Payer: Self-pay | Admitting: General Surgery

## 2021-08-30 DIAGNOSIS — G4730 Sleep apnea, unspecified: Secondary | ICD-10-CM

## 2021-08-30 DIAGNOSIS — R5383 Other fatigue: Secondary | ICD-10-CM

## 2021-08-30 NOTE — Telephone Encounter (Signed)
He is too large to image with any specificity. If no chest pain, new dyspnea, would clear for surgery. Any new symptoms would warrant reconsideration. ?

## 2021-08-31 ENCOUNTER — Telehealth: Payer: Self-pay

## 2021-08-31 NOTE — Telephone Encounter (Signed)
?  Patient Consent for Virtual Visit  ? ?    ? ?Christopher Skinner has provided verbal consent on 08/31/2021 for a virtual visit (video or telephone). ? ? ?CONSENT FOR VIRTUAL VISIT FOR:  Christopher Skinner  ?By participating in this virtual visit I agree to the following: ? ?I hereby voluntarily request, consent and authorize [facility] and its employed or contracted physicians, physician assistants, nurse practitioners or other licensed health care professionals (the Practitioner), to provide me with telemedicine health care services (the ?Services") as deemed necessary by the treating Practitioner. I acknowledge and consent to receive the Services by the Practitioner via telemedicine. I understand that the telemedicine visit will involve communicating with the Practitioner through live audiovisual communication technology and the disclosure of certain medical information by electronic transmission. I acknowledge that I have been given the opportunity to request an in-person assessment or other available alternative prior to the telemedicine visit and am voluntarily participating in the telemedicine visit. ? ?I understand that I have the right to withhold or withdraw my consent to the use of telemedicine in the course of my care at any time, without affecting my right to future care or treatment, and that the Practitioner or I may terminate the telemedicine visit at any time. I understand that I have the right to inspect all information obtained and/or recorded in the course of the telemedicine visit and may receive copies of available information for a reasonable fee.  I understand that some of the potential risks of receiving the Services via telemedicine include:  ?Delay or interruption in medical evaluation due to technological equipment failure or disruption; ?Information transmitted may not be sufficient (e.g. poor resolution of images) to allow for appropriate medical decision making by the  Practitioner; and/or  ?In rare instances, security protocols could fail, causing a breach of personal health information. ? ?Furthermore, I acknowledge that it is my responsibility to provide information about my medical history, conditions and care that is complete and accurate to the best of my ability. I acknowledge that Practitioner's advice, recommendations, and/or decision may be based on factors not within their control, such as incomplete or inaccurate data provided by me or distortions of diagnostic images or specimens that may result from electronic transmissions. I understand that the practice of medicine is not an exact science and that Practitioner makes no warranties or guarantees regarding treatment outcomes. I acknowledge that a copy of this consent can be made available to me via my patient portal ([place] [facility]), or I can request a printed copy by calling the office of [facility].   ? ?I understand that my insurance will be billed for this visit.  ? ?I have read or had this consent read to me. ?I understand the contents of this consent, which adequately explains the benefits and risks of the Services being provided via telemedicine.  ?I have been provided ample opportunity to ask questions regarding this consent and the Services and have had my questions answered to my satisfaction. ?I give my informed consent for the services to be provided through the use of telemedicine in my medical care ? ? ?

## 2021-08-31 NOTE — Telephone Encounter (Signed)
Contacted the patient got him scheduled for next week. Patient agreeable and voiced understanding. Med list and consent completed.  ?

## 2021-08-31 NOTE — Telephone Encounter (Signed)
? ? ?  Name: Christopher Skinner  ?DOB: 1970-07-23  ?MRN: [id number] ? ?Primary Cardiologist: Sinclair Grooms, MD ? ? ?Preoperative team, please contact this patient and set up a phone call appointment for further preoperative risk assessment. Please obtain consent and complete medication review. Thank you for your help. ? ?I confirm that guidance regarding antiplatelet and oral anticoagulation therapy has been completed and, if necessary, noted below. ? ? ? ?Darreld Mclean, PA-C ?08/31/2021, 9:08 AM ?[place] ?[address] ?[place], [place] [address] ?  ?

## 2021-09-06 ENCOUNTER — Ambulatory Visit (INDEPENDENT_AMBULATORY_CARE_PROVIDER_SITE_OTHER): Payer: 59 | Admitting: General Practice

## 2021-09-06 DIAGNOSIS — Z01810 Encounter for preprocedural cardiovascular examination: Secondary | ICD-10-CM | POA: Diagnosis not present

## 2021-09-06 NOTE — Progress Notes (Signed)
? ?Virtual Visit via Telephone Note  ? ?This visit type was conducted due to national recommendations for restrictions regarding the COVID-19 Pandemic (e.g. social distancing) in an effort to limit this patient's exposure and mitigate transmission in our community.  Due to his co-morbid illnesses, this patient is at least at moderate risk for complications without adequate follow up.  This format is felt to be most appropriate for this patient at this time.  The patient did not have access to video technology/had technical difficulties with video requiring transitioning to audio format only (telephone).  All issues noted in this document were discussed and addressed.  No physical exam could be performed with this format.  Please refer to the patient's chart for his  consent to telehealth for [facility]. ? ?Evaluation Performed:  Preoperative cardiovascular risk assessment ?_____________  ? ?Date:  09/06/2021  ? ?Patient ID:  Christopher Skinner, DOB July 14, 1970, MRN [id number] ?Patient Location:  ?Home ?Provider location:   ?Office ? ?Primary Care Provider:  Carlena Hurl, PA-C ?Primary Cardiologist:  Sinclair Grooms, MD ? ?Chief Complaint  ?  ?51 y.o. y/o male with a h/o aortic atherosclerosis, hyperlipidemia, morbid obesity, essential hypertension, type 2 diabetes, OSA, who is pending laparoscopic gastric bypass, and presents today for telephonic preoperative cardiovascular risk assessment. ? ?Past Medical History  ?  ?Past Medical History:  ?Diagnosis Date  ? COVID-19 05/02/2019  ? Diabetes mellitus without complication ([place]) 2025  ? GERD (gastroesophageal reflux disease)   ? H/O exercise stress test 2009  ? reportedly normal per patient  ? Hyperlipidemia   ? Hypertension 2008  ? Microalbuminuria 2019  ? Obesity   ? Sleep apnea   ? last used CPAP 2014; quit on his own  ? ?Past Surgical History:  ?Procedure Laterality Date  ? CARPAL TUNNEL RELEASE    ? left  ? COLONOSCOPY  2010  ? normal per patient  ?  COLONOSCOPY WITH PROPOFOL N/A 02/08/2021  ? Procedure: COLONOSCOPY WITH PROPOFOL;  Surgeon: Gatha Mayer, MD;  Location: [facility];  Service: Endoscopy;  Laterality: N/A;  ? HAND SURGERY    ? growth resection, right  ? POLYPECTOMY  02/08/2021  ? Procedure: POLYPECTOMY;  Surgeon: Gatha Mayer, MD;  Location: [facility];  Service: Endoscopy;;  ? ? ?Allergies ? ?Allergies  ?Allergen Reactions  ? Metformin And Related   ?  Bad loose stools even at low doses ?  ? Shellfish Allergy Nausea And Vomiting  ? ? ?History of Present Illness  ?  ?Christopher Skinner is a 51 y.o. male who presents via audio/video conferencing for a telehealth visit today.  Pt was last seen in cardiology clinic on 07/02/2021 by Dr. Tamala Julian.  At that time [name] was doing well .  The patient is now pending procedure as outlined above. Since his last visit, he remains stable from a cardiac standpoint. ? ?Today he denies chest pain, shortness of breath, lower extremity edema, fatigue, palpitations, melena, hematuria, hemoptysis, diaphoresis, weakness, presyncope, syncope, orthopnea, and PND. ? ? ? ?Home Medications  ?  ?Prior to Admission medications   ?Medication Sig Start Date End Date Taking? Authorizing Provider  ?albuterol (PROVENTIL HFA;VENTOLIN HFA) 108 (90 Base) MCG/ACT inhaler Inhale 2 puffs into the lungs every 4 (four) hours as needed for wheezing or shortness of breath. 03/22/17   Lacretia Leigh, MD  ?aspirin EC 81 MG tablet Take 81 mg by mouth as needed. Swallow whole.    [provider]  ?  atorvastatin (LIPITOR) 80 MG tablet Take 1 tablet (80 mg total) by mouth daily. 05/21/21 05/21/22  Tysinger, [name], PA-C  ?blood glucose meter kit and supplies KIT Dispense based on patient and insurance preference. Use 1-2 times daily as directed. (FOR ICD-10 DE11.69). 06/08/19   Tysinger, [name], PA-C  ?Blood Glucose Monitoring Suppl (TRUE METRIX METER) DEVI 1 Device by Does not apply route daily. 06/10/19   Tysinger,  [name], PA-C  ?carvedilol (COREG) 12.5 MG tablet Take 1 tablet (12.5 mg total) by mouth 2 (two) times daily with a meal. 11/01/20   Tysinger, [name], PA-C  ?cyclobenzaprine (FLEXERIL) 5 MG tablet Take 1 tablet (5 mg total) by mouth 3 (three) times daily as needed for muscle spasms. Do not drink alcohol or drive while taking this medication.  May cause drowsiness. 08/08/21   Volney American, PA-C  ?empagliflozin (JARDIANCE) 25 MG TABS tablet Take 1 tablet (25 mg total) by mouth daily before breakfast. 11/01/20   Tysinger, [name], PA-C  ?glucose blood test strip True metrix strips to test 1-2 times daily 05/21/21   Tysinger, [name], PA-C  ?Lancets MISC Use as instructed. Needs lancets based on pt's meter (true metrix) 05/21/21   Tysinger, [name], PA-C  ?losartan-hydrochlorothiazide (HYZAAR) 100-12.5 MG tablet Take 1 tablet by mouth daily. 11/01/20   Tysinger, [name], PA-C  ?naproxen (NAPROSYN) 500 MG tablet Take 1 tablet (500 mg total) by mouth 2 (two) times daily as needed. 08/08/21   Volney American, PA-C  ?RABEprazole (ACIPHEX) 20 MG tablet Take 1 tablet (20 mg total) by mouth daily. 11/01/20   Tysinger, [name], PA-C  ?Semaglutide, 2 MG/DOSE, (OZEMPIC, 2 MG/DOSE,) 8 MG/3ML SOPN Inject 2 mg into the skin every Thursday. 05/24/21   Tysinger, [name], PA-C  ?vitamin B-12 (CYANOCOBALAMIN) 1000 MCG tablet Take 1 tablet (1,000 mcg total) by mouth daily. 11/01/20   Tysinger, [name], PA-C  ? ? ?Physical Exam  ?  ?Vital Signs:  [name] does not have vital signs available for review today. ? ?Given telephonic nature of communication, physical exam is limited. ?AAOx3. NAD. Normal affect.  Speech and respirations are unlabored. ? ?Accessory Clinical Findings  ?  ?None ? ?Assessment & Plan  ?  ?1.  Preoperative Cardiovascular Risk Assessment: [place] surgery at [facility]; Dr. Gurney Maxin; laparoscopic gastric bypass, procedure has not yet been scheduled. ? ?  ? ?Primary Cardiologist: Sinclair Grooms, MD ? ?Chart reviewed as part of pre-operative protocol coverage. Given past medical history and time since last visit, based on ACC/AHA guidelines, SANDRA TELLEFSEN would be at acceptable risk for the planned procedure without further cardiovascular testing.  ? ?Patient was advised that if he develops new symptoms prior to surgery to contact our office to arrange a follow-up appointment.  He verbalized understanding. ? ? ?His RCRI is a class I risk, 0.4% risk of major cardiac event.  He is able to complete greater than 4 METS of physical activity. ? ? ? ?A copy of this note will be routed to requesting surgeon. ? ?Time:   ?Today, I have spent 7 minutes with the patient with telehealth technology discussing medical history, symptoms, and management plan.  I spent greater than 10 minutes reviewing patient's past medical history and medications. ? ? ?Deberah Pelton, NP ? ?09/06/2021, 8:28 AM ? ?

## 2021-09-14 ENCOUNTER — Inpatient Hospital Stay (HOSPITAL_COMMUNITY): Admission: RE | Admit: 2021-09-14 | Payer: 59 | Source: Ambulatory Visit

## 2021-09-14 ENCOUNTER — Encounter (HOSPITAL_COMMUNITY): Payer: Self-pay

## 2021-09-28 ENCOUNTER — Encounter: Payer: 59 | Attending: General Surgery | Admitting: Dietician

## 2021-09-28 ENCOUNTER — Encounter: Payer: Self-pay | Admitting: Dietician

## 2021-09-28 DIAGNOSIS — E1165 Type 2 diabetes mellitus with hyperglycemia: Secondary | ICD-10-CM | POA: Diagnosis present

## 2021-09-28 NOTE — Progress Notes (Signed)
Nutrition Assessment for Bariatric Surgery Medical Nutrition Therapy Appt Start Time: 9:10    End Time: 10:16  Patient was seen on 09/28/2021 for Pre-Operative Nutrition Assessment. Letter of approval faxed to [facility] bariatric surgery program coordinator on 09/28/2021.   Referral stated Supervised Weight Loss (SWL) visits needed: 6  Planned surgery: RYGB Pt expectation of surgery: Be able to do more, like going with family and vacation. Pt expectation of dietitian: Motivation   NUTRITION ASSESSMENT   Anthropometrics  Start weight at [facility]: 468.7 lbs (date: 09/28/2021)  Height: 69 in Weight: 468.7 BMI: 69.21 kg/m2    Clinical  Medical hx: GERD, T2DM, Sleep Apnea, HTN, Hyperlipidemia  Medications: Ozempic, Losartan, Jardiance  Labs: 05/21/2021: A1C 6.5 Notable signs/symptoms: none noted Any previous deficiencies? No  Micronutrient Nutrition Focused Physical Exam: Hair: No issues observed Eyes: No issues observed Mouth: No issues observed Neck: No issues observed Nails: No issues observed Skin: No issues observed  Lifestyle & Dietary Hx  Pt states he is currently unemployed, stating he has been trying to apply for disability. Pt states Diet Pepsi makes him vomit.  Pt states he can't seem to get away from the sugary drinks. Dietitian discussed non-calorie sweeteners as an option. Pt states he typically skips breakfast or lunch.  Dietitian discussed the importance of eating through out the day in preparation for post surgery and to decrease acid accumulation in stomach to reduce reflux. Pt states he wants to work on increasing his physical activity for the next visit.  24-Hr Dietary Recall First Meal: bacon egg and cheese sandwich Snack:  Second Meal: bowl of cucumbers and some strawberries Snack: fruit Third Meal: tacos (ground beef, lettuce, tomato, cheese, salsa) Snack:  Beverages: soda, cool-aid   NUTRITION DIAGNOSIS  Overweight/obesity ([place]-3.3)  related to past poor dietary habits and physical inactivity as evidenced by patient w/ planned RYGB surgery following dietary guidelines for continued weight loss.    NUTRITION INTERVENTION  Nutrition counseling (C-1) and education (E-2) to facilitate bariatric surgery goals.  Educated pt on micronutrient deficiencies post surgery and strategies to mitigate that risk   Pre-Op Goals Reviewed with the Patient Track food and beverage intake (pen and paper, MyFitness Pal, Baritastic app, etc.) Make healthy food choices while monitoring portion sizes Consume 3 meals per day or try to eat every 3-5 hours Avoid concentrated sugars and fried foods Keep sugar & fat in the single digits per serving on food labels Practice CHEWING your food (aim for applesauce consistency) Practice not drinking 15 minutes before, during, and 30 minutes after each meal and snack Avoid all carbonated beverages (ex: soda, sparkling beverages)  Limit caffeinated beverages (ex: coffee, tea, energy drinks) Avoid all sugar-sweetened beverages (ex: regular soda, sports drinks)  Avoid alcohol  Aim for 64-100 ounces of FLUID daily (with at least half of fluid intake being plain water)  Aim for at least 60-80 grams of PROTEIN daily Look for a liquid protein source that contains ?15 g protein and ?5 g carbohydrate (ex: shakes, drinks, shots) Make a list of non-food related activities Physical activity is an important part of a healthy lifestyle so keep it moving! The goal is to reach 150 minutes of exercise per week, including cardiovascular and weight baring activity.  *Goals that are bolded indicate the pt would like to start working towards these  Handouts Provided Include  Bariatric Surgery handouts (Nutrition Visits, Pre-Op Goals, Protein Shakes, Vitamins & Minerals)  Learning Style & Readiness for Change Teaching method utilized: Visual &  Auditory  Demonstrated degree of understanding via: Teach Back  Readiness  Level: contemplative Barriers to learning/adherence to lifestyle change: sedentary habits and sugar drink cravings  RD's Notes for Next Visit     MONITORING & EVALUATION Dietary intake, weekly physical activity, body weight, and pre-op goals reached at next nutrition visit.    Next Steps  Patient is to follow up at [place] next month for SWL visit.

## 2021-10-08 ENCOUNTER — Ambulatory Visit (HOSPITAL_COMMUNITY)
Admission: RE | Admit: 2021-10-08 | Discharge: 2021-10-08 | Disposition: A | Discharge status: Home / Self Care | Payer: 59 | Source: Ambulatory Visit | Attending: General Surgery | Admitting: General Surgery

## 2021-10-08 ENCOUNTER — Other Ambulatory Visit: Payer: Self-pay

## 2021-10-08 DIAGNOSIS — R5383 Other fatigue: Secondary | ICD-10-CM | POA: Insufficient documentation

## 2021-10-08 DIAGNOSIS — G4730 Sleep apnea, unspecified: Secondary | ICD-10-CM | POA: Diagnosis present

## 2021-10-08 DIAGNOSIS — I2510 Atherosclerotic heart disease of native coronary artery without angina pectoris: Secondary | ICD-10-CM

## 2021-10-11 ENCOUNTER — Ambulatory Visit: Payer: 59 | Admitting: Dietician

## 2021-10-16 ENCOUNTER — Encounter: Payer: 59 | Attending: General Surgery | Admitting: Dietician

## 2021-10-16 ENCOUNTER — Encounter: Payer: Self-pay | Admitting: Dietician

## 2021-10-16 DIAGNOSIS — E1165 Type 2 diabetes mellitus with hyperglycemia: Secondary | ICD-10-CM | POA: Diagnosis present

## 2021-10-16 NOTE — Progress Notes (Signed)
Supervised Weight Loss Visit Bariatric Nutrition Education Appt Start Time: 9.40    End Time: 10:10  Planned surgery: RYGB Pt expectation of surgery: Be able to do more, like going with family and vacation. Pt expectation of dietitian: Motivation  1 out of 6 SWL Appointments    NUTRITION ASSESSMENT   Anthropometrics  Start weight at [facility]: 468.7 lbs (date: 09/28/2021)  Height: 69 in Weight: 469.2 BMI: 69.21 kg/m2    Clinical  Medical hx: GERD, T2DM, Sleep Apnea, HTN, Hyperlipidemia  Medications: Ozempic, Losartan, Jardiance  Labs: 05/21/2021: A1C 6.5 Notable signs/symptoms: none noted Any previous deficiencies? No  Lifestyle & Dietary Hx  Pt states he has not checked his blood sugars lately. Pt states he has a [place] and stays on the go. Pt states he is eating everything baked, with lots of vegetables. Pt states he eats late and gets acid reflux. Pt states he has not been able to take his reflux medication for two weeks, stating he had lab work today and was instructed not to take the medication prior to blood draw. Pt states he starts physical therapy on Thursday.   Pt states he has some weights in the house.   Estimated daily fluid intake: 128 oz Supplements: B12 in the morning, multi vitamin Current average weekly physical activity: ADLs  24-Hr Dietary Recall First Meal: sandwich bacon and egg Snack: grapes and strawberries Second Meal: Ham and cheese sandwich with lettuce and tomatoes Snack: grapes and strawberries Third Meal: 7 pm: baked chicken, squash, mashed potatoes, green beans Snack:  Beverages: ginger ale, water, Gatorade   NUTRITION DIAGNOSIS  Overweight/obesity ([place]-3.3) related to past poor dietary habits and physical inactivity as evidenced by patient w/ planned RYGB surgery following dietary guidelines for continued weight loss.   NUTRITION INTERVENTION  Nutrition counseling (C-1) and education (E-2) to facilitate bariatric surgery  goals.  A high fiber diet with plenty of fluids (up to 8 glasses of water daily).  Discussed the food label Nutrition Facts for breads, and recommended whole grain bread with about 3 grams of dietary fiber per serving. Why you need complex carbohydrates: Whole grains and other complex carbohydrates are required to have a healthy diet. Whole grains provide fiber which can help with blood glucose levels and help keep you satiated. Fruits and starchy vegetables provide essential vitamins and minerals required for immune function, eyesight support, brain support, bone density, wound healing and many other functions within the body. According to the current evidenced based 2020-2025 Dietary Guidelines for Americans, complex carbohydrates are part of a healthy eating pattern which is associated with a decreased risk for type 2 diabetes, cancers, and cardiovascular disease.    Pre-Op Goals Progress & New Goals Alternate to sugary drinks: Gatorade zero, water (alkaline water for reflux) Whole grain bread (about 3 grams of fiber per serving) Walk, walk, walk   Handouts Provided Include  Goals printed  Learning Style & Readiness for Change Teaching method utilized: Visual & Auditory  Demonstrated degree of understanding via: Teach Back  Readiness Level: Contemplative Barriers to learning/adherence to lifestyle change: sedentary habits and sugar drink cravings  RD's Notes for next Visit  Progress toward patient's chosen goals   MONITORING & EVALUATION Dietary intake, weekly physical activity, body weight, and pre-op goals in 1 month.   Next Steps  Patient is to return to [facility] in 1 month for next SWL visit.

## 2021-10-18 ENCOUNTER — Ambulatory Visit (HOSPITAL_BASED_OUTPATIENT_CLINIC_OR_DEPARTMENT_OTHER): Payer: 59 | Attending: General Surgery | Admitting: Physical Therapy

## 2021-10-18 ENCOUNTER — Encounter (HOSPITAL_BASED_OUTPATIENT_CLINIC_OR_DEPARTMENT_OTHER): Payer: Self-pay | Admitting: Physical Therapy

## 2021-10-18 ENCOUNTER — Other Ambulatory Visit: Payer: Self-pay

## 2021-10-18 DIAGNOSIS — R262 Difficulty in walking, not elsewhere classified: Secondary | ICD-10-CM | POA: Diagnosis present

## 2021-10-18 DIAGNOSIS — M5459 Other low back pain: Secondary | ICD-10-CM | POA: Diagnosis present

## 2021-10-18 DIAGNOSIS — M6281 Muscle weakness (generalized): Secondary | ICD-10-CM | POA: Diagnosis present

## 2021-10-18 NOTE — Therapy (Addendum)
OUTPATIENT PHYSICAL THERAPY LOWER EXTREMITY EVALUATION   Patient Name: Christopher Skinner MRN: [id number] DOB:12-26-1970, 51 y.o., male Today's Date: 10/18/2021   PT End of Session - 10/18/21 1511     Visit Number 1    Number of Visits 7    Date for PT Re-Evaluation 11/30/21    Authorization Type [employer]    PT Start Time 4098    PT Stop Time 1191    PT Time Calculation (min) 46 min    Activity Tolerance Patient tolerated treatment well    Behavior During Therapy [facility] for tasks assessed/performed             Past Medical History:  Diagnosis Date   COVID-19 05/02/2019   Diabetes mellitus without complication ([place]) 4782   GERD (gastroesophageal reflux disease)    H/O exercise stress test 2009   reportedly normal per patient   Hyperlipidemia    Hypertension 2008   Microalbuminuria 2019   Obesity    Sleep apnea    last used CPAP 2014; quit on his own   Past Surgical History:  Procedure Laterality Date   CARPAL TUNNEL RELEASE     left   COLONOSCOPY  2010   normal per patient   COLONOSCOPY WITH PROPOFOL N/A 02/08/2021   Procedure: COLONOSCOPY WITH PROPOFOL;  Surgeon: Gatha Mayer, MD;  Location: [facility] ENDOSCOPY;  Service: Endoscopy;  Laterality: N/A;   HAND SURGERY     growth resection, right   POLYPECTOMY  02/08/2021   Procedure: POLYPECTOMY;  Surgeon: Gatha Mayer, MD;  Location: [facility] ENDOSCOPY;  Service: Endoscopy;;   Patient Active Problem List   Diagnosis Date Noted   Neck pain on right side 06/20/2021   BMI 60.0-69.9, adult ([place]) 05/21/2021   Right arm numbness 01/08/2021   Right sided numbness 01/08/2021   EKG abnormalities 01/08/2021   Vaccine refused by patient 10/31/2020   Encounter for health maintenance examination in adult 10/31/2020   Anhidrosis 12/21/2019   Hepatomegaly 06/23/2019   Aortic atherosclerosis ([place]) 06/23/2019   Anemia 06/23/2019   Chest pain 06/12/2019   Cough 06/08/2019   Chronic pain of right knee 04/16/2019    Screen for colon cancer 10/05/2018   Essential hypertension, benign 10/05/2018   Hyperlipidemia 10/05/2018   Microalbuminuria 10/05/2018   Gastroesophageal reflux disease 10/05/2018   Vaccine counseling 10/05/2018   Type 2 diabetes mellitus with hyperglycemia ([place]) 10/05/2018   OSA (obstructive sleep apnea) 10/05/2018   Morbid obesity ([place]) 10/26/2012    PCP: Chana Bode, PA-C  REFERRING PROVIDER: Kinsinger, Arta Bruce, MD  REFERRING DIAG: E66.01 (ICD-10-CM) - Morbid (severe) obesity due to excess calories  THERAPY DIAG:  Other low back pain  Muscle weakness (generalized)  Difficulty in walking, not elsewhere classified  Rationale for Evaluation and Treatment Rehabilitation  ONSET DATE: years ago  SUBJECTIVE:   SUBJECTIVE STATEMENT: I had a problem with my back years ago- MD told me I have a crook in my back, like a bone spur or something. Limited to about 20-25 min on treadmil.   PERTINENT HISTORY: Knee pain, chronic back pain  PAIN:  Are you having pain? No  PRECAUTIONS: None  WEIGHT BEARING RESTRICTIONS No  FALLS:  Has patient fallen in last 6 months? 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   PLOF: Independent  PATIENT GOALS get ready for surgery     OBJECTIVE:  PATIENT SURVEYS:  FOTO 42  COGNITION:  Overall cognitive status: Within functional limits for tasks assessed     SENSATION: WFL   POSTURE: pelvis anterior to shoulders in standing posture   LOWER EXTREMITY ROM:  WFL  LOWER EXTREMITY MMT:  MMT Right eval Left eval  Hip flexion    Hip extension    Hip abduction    Hip adduction    Hip internal rotation    Hip external rotation    Knee flexion    Knee extension    Ankle dorsiflexion    Ankle plantarflexion    Ankle inversion    Ankle eversion     (Blank rows = not tested)    FUNCTIONAL TESTS:  6 minute walk  test: 1075 ft, HR 131, O299%, back pain 5/10 30s 12.5     TODAY'S TREATMENT: EVAL: nu step hybrid machine pt tolerated 4 minutes Lat pull down in standing Cable row- UE motion & isometric row with stepping   PATIENT EDUCATION:  Education details: Geophysicist/field seismologist of condition, POC, HEP, exercise form/rationale  Person educated: Patient Education method: Explanation, Demonstration, Tactile cues, and Verbal cues Education comprehension: verbalized understanding, returned demonstration, verbal cues required, tactile cues required, and needs further education   HOME EXERCISE PROGRAM: Gym program began today  ASSESSMENT:  CLINICAL IMPRESSION: Patient is a 51 y.o. M who was seen today for physical therapy evaluation and treatment for preparation for bariatric surgery and addressing mobility limitations. Pt presents with limitations to mobility from cardiac system as well as from LBP. Pt verbalized fatigue with exercise today but does like to lift weights. Will also trial some appointments in the pool for weight-reduced activity to decrease pressure through low back and challenge endurance.    OBJECTIVE IMPAIRMENTS decreased activity tolerance, difficulty walking, decreased strength, improper body mechanics, postural dysfunction, obesity, and pain.   ACTIVITY LIMITATIONS carrying, lifting, bending, standing, and locomotion level  PARTICIPATION LIMITATIONS: meal prep, cleaning, community activity, and occupation  PERSONAL FACTORS Fitness and 1-2 comorbidities: back and knee pain  are also affecting patient's functional outcome.   REHAB POTENTIAL: Good  CLINICAL DECISION MAKING: Stable/uncomplicated  EVALUATION COMPLEXITY: Low   GOALS: Goals reviewed with patient? Yes  SHORT TERM GOALS: Target date: 11/02/2021  Pt will begin regular cardio workout at least 3d/week Baseline: Goal status: INITIAL    LONG TERM GOALS: Target date: 11/30/2021   6MWT time to functional  category Baseline: 1075 at eval; functional is 1200-1750 feet Goal status: INITIAL  2.  30s sit to stand to 15 Baseline: 12.5 at eval Goal status: INITIAL  3.  Pt will report ability to utilize exercise while maintaining back pain <=3/10 Baseline:  Goal status: INITIAL  4.  Pt will verbalize readiness for surgery Baseline:  Goal status: INITIAL     PLAN: PT FREQUENCY: 1x/week  PT DURATION: 6 weeks  PLANNED INTERVENTIONS: Therapeutic exercises, Therapeutic activity, Neuromuscular re-education, Balance training, Gait training, Patient/Family education, Joint mobilization, Aquatic Therapy, Electrical stimulation, Cryotherapy, Moist heat, Taping, Manual therapy, and Re-evaluation  PLAN FOR NEXT SESSION: progress gym program with free weights and machines, in pool: walking endurance, gross strength- focus on core & balance  PHYSICAL THERAPY DISCHARGE SUMMARY  Visits from Start of Care: 1  Current functional level related to goals / functional outcomes: See above   Remaining deficits: See abvoe   Education / Equipment: Anatomy of condition, POC, HEP, exercise form/rationale    Patient agrees to discharge. Patient goals were not met. Patient is being  discharged due to not returning since the last visit.   Monserat Prestigiacomo C. [place] PT, DPT 10/19/21 9:11 AM

## 2021-11-04 ENCOUNTER — Other Ambulatory Visit: Payer: Self-pay | Admitting: Medical

## 2021-11-04 DIAGNOSIS — K219 Gastro-esophageal reflux disease without esophagitis: Secondary | ICD-10-CM

## 2021-11-07 ENCOUNTER — Ambulatory Visit (HOSPITAL_BASED_OUTPATIENT_CLINIC_OR_DEPARTMENT_OTHER): Payer: 59 | Admitting: Physical Therapy

## 2021-11-09 ENCOUNTER — Ambulatory Visit: Payer: 59 | Admitting: Dietician

## 2021-11-12 ENCOUNTER — Ambulatory Visit (HOSPITAL_BASED_OUTPATIENT_CLINIC_OR_DEPARTMENT_OTHER): Payer: 59 | Attending: General Surgery | Admitting: Physical Therapy

## 2021-11-12 DIAGNOSIS — M5459 Other low back pain: Secondary | ICD-10-CM | POA: Insufficient documentation

## 2021-11-12 DIAGNOSIS — M6281 Muscle weakness (generalized): Secondary | ICD-10-CM | POA: Insufficient documentation

## 2021-11-12 DIAGNOSIS — R262 Difficulty in walking, not elsewhere classified: Secondary | ICD-10-CM | POA: Insufficient documentation

## 2021-11-14 ENCOUNTER — Encounter: Payer: Self-pay | Admitting: Neurology

## 2021-11-14 ENCOUNTER — Institutional Professional Consult (permissible substitution): Payer: 59 | Admitting: Neurology

## 2021-11-15 ENCOUNTER — Ambulatory Visit: Payer: 59 | Admitting: Dietician

## 2021-11-20 ENCOUNTER — Ambulatory Visit (HOSPITAL_BASED_OUTPATIENT_CLINIC_OR_DEPARTMENT_OTHER): Payer: 59 | Admitting: Physical Therapy

## 2021-11-21 ENCOUNTER — Encounter (HOSPITAL_BASED_OUTPATIENT_CLINIC_OR_DEPARTMENT_OTHER): Payer: Self-pay | Admitting: Physical Therapy

## 2021-11-27 ENCOUNTER — Ambulatory Visit (HOSPITAL_BASED_OUTPATIENT_CLINIC_OR_DEPARTMENT_OTHER): Payer: 59 | Admitting: Physical Therapy

## 2021-12-03 ENCOUNTER — Encounter (HOSPITAL_BASED_OUTPATIENT_CLINIC_OR_DEPARTMENT_OTHER): Payer: 59 | Admitting: Physical Therapy

## 2021-12-05 ENCOUNTER — Other Ambulatory Visit: Payer: Self-pay | Admitting: Medical

## 2021-12-05 DIAGNOSIS — I10 Essential (primary) hypertension: Secondary | ICD-10-CM

## 2021-12-05 NOTE — Telephone Encounter (Signed)
Christopher Skinner pt is requesting a refill on his Achipex, although I don't see a recent appointment, would he need to schedule one?

## 2021-12-05 NOTE — Telephone Encounter (Signed)
Yes, is due for visit (physical was due in July--if he can't get in within a short period of time, at least needs a med check scheduled--pt is a diabetic).  Looks like he was given #30 of aciphex in July (not sure if he was contacted and advised to schedule at that point or not). Okay for 30d

## 2021-12-05 NOTE — Telephone Encounter (Signed)
My chart message has been sent to pt for appt. [place]

## 2021-12-06 ENCOUNTER — Other Ambulatory Visit: Payer: Self-pay

## 2021-12-11 ENCOUNTER — Ambulatory Visit: Payer: 59 | Admitting: Dietician

## 2021-12-12 ENCOUNTER — Encounter (HOSPITAL_BASED_OUTPATIENT_CLINIC_OR_DEPARTMENT_OTHER): Payer: 59 | Admitting: Physical Therapy

## 2021-12-13 ENCOUNTER — Other Ambulatory Visit: Payer: Self-pay | Admitting: Medical

## 2021-12-13 ENCOUNTER — Telehealth: Payer: Self-pay | Admitting: Medical

## 2021-12-13 MED ORDER — TRULICITY 4.5 MG/0.5ML ~~LOC~~ SOAJ: 4.5000 mg | SUBCUTANEOUS | 1 refills | Status: DC

## 2021-12-13 NOTE — Telephone Encounter (Signed)
Recv'd denial for Ozempic, (Pt has new insurance) pt must try and fail both Trulicity & Victoza.  Can patient be switched to Trulicity?

## 2021-12-13 NOTE — Telephone Encounter (Signed)
P.A. OZEMPIC completed, (pt's wife had left message he was out of his med).  Called pt and informed we have sample to hold him until hear back from insurance company.

## 2021-12-17 ENCOUNTER — Ambulatory Visit: Payer: 59 | Admitting: Dietician

## 2021-12-22 ENCOUNTER — Telehealth: Payer: Self-pay | Admitting: Medical

## 2021-12-22 NOTE — Telephone Encounter (Signed)
P.A. TRULICITY 

## 2021-12-29 NOTE — Telephone Encounter (Signed)
switched

## 2021-12-29 NOTE — Telephone Encounter (Signed)
P.A. approved til 12/14/22

## 2022-01-01 NOTE — Telephone Encounter (Signed)
Pt informed follow up with in month of starting Trulicity, said thus far doesn't like the way it's making him feel, advised call back in about a week and let us know.

## 2022-01-02 ENCOUNTER — Encounter: Payer: Self-pay | Admitting: Internal Medicine

## 2022-01-02 ENCOUNTER — Other Ambulatory Visit: Payer: Self-pay | Admitting: Medical

## 2022-01-02 DIAGNOSIS — K219 Gastro-esophageal reflux disease without esophagitis: Secondary | ICD-10-CM

## 2022-01-11 ENCOUNTER — Ambulatory Visit: Payer: 59 | Admitting: Family Medicine

## 2022-01-15 ENCOUNTER — Encounter: Payer: Self-pay | Admitting: Medical

## 2022-01-23 ENCOUNTER — Ambulatory Visit (INDEPENDENT_AMBULATORY_CARE_PROVIDER_SITE_OTHER): Payer: 59 | Admitting: Medical

## 2022-01-23 VITALS — BP 118/70 | HR 87 | Wt >= 6400 oz

## 2022-01-23 DIAGNOSIS — I10 Essential (primary) hypertension: Secondary | ICD-10-CM

## 2022-01-23 DIAGNOSIS — R160 Hepatomegaly, not elsewhere classified: Secondary | ICD-10-CM

## 2022-01-23 DIAGNOSIS — Z2820 Immunization not carried out because of patient decision for unspecified reason: Secondary | ICD-10-CM | POA: Diagnosis not present

## 2022-01-23 DIAGNOSIS — I700 Atherosclerosis of aorta: Secondary | ICD-10-CM

## 2022-01-23 DIAGNOSIS — E1165 Type 2 diabetes mellitus with hyperglycemia: Secondary | ICD-10-CM | POA: Diagnosis not present

## 2022-01-23 DIAGNOSIS — L304 Erythema intertrigo: Secondary | ICD-10-CM

## 2022-01-23 DIAGNOSIS — Z125 Encounter for screening for malignant neoplasm of prostate: Secondary | ICD-10-CM

## 2022-01-23 DIAGNOSIS — K219 Gastro-esophageal reflux disease without esophagitis: Secondary | ICD-10-CM | POA: Diagnosis not present

## 2022-01-23 DIAGNOSIS — R809 Proteinuria, unspecified: Secondary | ICD-10-CM

## 2022-01-23 DIAGNOSIS — E782 Mixed hyperlipidemia: Secondary | ICD-10-CM | POA: Diagnosis not present

## 2022-01-23 MED ORDER — NYSTATIN 100000 UNIT/GM EX POWD: 1.0000 | Freq: Three times a day (TID) | CUTANEOUS | 2 refills | Status: DC

## 2022-01-23 MED ORDER — VICTOZA 18 MG/3ML ~~LOC~~ SOPN: 3.0000 mg | PEN_INJECTOR | Freq: Every day | SUBCUTANEOUS | 1 refills | Status: DC

## 2022-01-23 NOTE — Progress Notes (Signed)
Subjective:  Christopher Skinner is a 51 y.o. male who presents for Chief Complaint  Patient presents with   med check    Med check- Trulicity- breakout around the groin- itchy/rash and will last a couple days. Sometimes it hurts to walk.     Here for med check.   Since last visit he had a few visits at bariatric clinic.  Saw them a few times, but then decided not to go through with surgery.   He is worried about the after effects of the surgery.  Hyperlipidemia - compliant with Lipitor '80mg'$  without c/o  HTN - compliant with Coreg BID and Losartan HCT 100/12.'5mg'$  daily.    No chest pain, no palpation.    Diabetes - compliant with Jardiance '25mg'$ , is using Trulicity weekly.  Was doing ozempic '2mg'$  weekly but changed insurance and they wont cover ozempic.  He just started Trulicity about a month ago.  Having rash in groin since starting Trulicity.  No other aggravating or relieving factors.    No other c/o.  The following portions of the patient's history were reviewed and updated as appropriate: allergies, current medications, past family history, past medical history, past social history, past surgical history and problem list.  ROS Otherwise as in subjective above    Objective: BP 118/70   Pulse 87   Wt (!) 465 lb 3.2 oz (211 kg)   BMI 68.70 kg/m   Wt Readings from Last 3 Encounters:  01/23/22 (!) 465 lb 3.2 oz (211 kg)  10/16/21 (!) 469 lb 3.2 oz (212.8 kg)  09/28/21 (!) 468 lb 11.2 oz (212.6 kg)   General appearance: alert, no distress, well developed, well nourished Skin discoloration of the folds of the skin on her abdomen pannus and upper inner thighs Neck: supple, no lymphadenopathy, no thyromegaly, no masses Heart: RRR, normal S1, S2, no murmurs Lungs: CTA bilaterally, no wheezes, rhonchi, or rales Pulses: 2+ radial pulses, 2+ pedal pulses, normal cap refill Ext: no edema  Diabetic Foot Exam - Simple   Simple Foot Form Diabetic Foot exam was performed with  the following findings: Yes 01/23/2022 11:23 AM  Visual Inspection See comments: Yes Sensation Testing Intact to touch and monofilament testing bilaterally: Yes Pulse Check See comments: Yes Comments 1+ pedal pulses, dry skin, some thickened toenails       Assessment: Encounter Diagnoses  Name Primary?   Essential hypertension, benign Yes   Mixed hyperlipidemia    Type 2 diabetes mellitus with hyperglycemia, without long-term current use of insulin (HCC)    Vaccine refused by patient    Morbid obesity ([place])    Microalbuminuria    Hepatomegaly    Gastroesophageal reflux disease, unspecified whether esophagitis present    Aortic atherosclerosis ([place])    Screening for prostate cancer    Intertrigo      Plan: We reviewed his medications, chart record, problem list.  Diabetes-continue current medication with Jardiance.  Stop Trulicity given the side effects he is having.  Begin trial of Victoza.  If he does not tolerate Victoza we can probably try back on Ozempic which he tolerated better.  Continue glucose monitoring everything was looking healthy diet and exercise.  Hypertension-continue current medication  Hyperlipidemia-continue current medication, routine labs today  PSA screening today as discussed  Intertrigo-keep area dry, begin nystatin powder daily  Obesity-continue efforts to lose weight with healthy diet and exercise.  I recommend he reconsider bariatric surgery and f/u since he is already established care there.  We  discussed the risk and benefits of surgery .   I really think he should consider bariatric surgery.     Christopher Skinner was seen today for med check.  Diagnoses and all orders for this visit:  Essential hypertension, benign -     CBC  Mixed hyperlipidemia -     Comprehensive metabolic panel -     Lipid panel  Type 2 diabetes mellitus with hyperglycemia, without long-term current use of insulin (HCC) -     Hemoglobin A1c  Vaccine refused by  patient  Morbid obesity (HCC)  Microalbuminuria -     Microalbumin/Creatinine Ratio, Urine  Hepatomegaly -     Comprehensive metabolic panel  Gastroesophageal reflux disease, unspecified whether esophagitis present  Aortic atherosclerosis (HCC)  Screening for prostate cancer -     PSA  Intertrigo  Other orders -     liraglutide (VICTOZA) 18 MG/3ML SOPN; Inject 3 mg into the skin daily. -     nystatin (MYCOSTATIN/NYSTOP) powder; Apply 1 Application topically 3 (three) times daily.    Follow up: pending labs

## 2022-01-23 NOTE — Patient Instructions (Signed)
Ophthalmologists [facility] [place], [place], [place] [address] [place] [address], [place], [place] [address] [place], [place], [place] [address] [phone]  [facility] Ophthalmology  [place], [place], [place] [address] [place] [address], [place], [place] [address] [phone]

## 2022-01-24 ENCOUNTER — Other Ambulatory Visit: Payer: Self-pay | Admitting: Medical

## 2022-01-24 DIAGNOSIS — I10 Essential (primary) hypertension: Secondary | ICD-10-CM

## 2022-01-24 DIAGNOSIS — K219 Gastro-esophageal reflux disease without esophagitis: Secondary | ICD-10-CM

## 2022-01-24 LAB — CBC
Hematocrit: 39.4 % (ref 37.5–51.0)
Hemoglobin: 13 g/dL (ref 13.0–17.7)
MCH: 27.9 pg (ref 26.6–33.0)
MCHC: 33 g/dL (ref 31.5–35.7)
MCV: 85 fL (ref 79–97)
Platelets: 378 10*3/uL (ref 150–450)
RBC: 4.66 x10E6/uL (ref 4.14–5.80)
RDW: 14.7 % (ref 11.6–15.4)
WBC: 6.5 10*3/uL (ref 3.4–10.8)

## 2022-01-24 LAB — COMPREHENSIVE METABOLIC PANEL
ALT: 28 IU/L (ref 0–44)
AST: 25 IU/L (ref 0–40)
Albumin/Globulin Ratio: 1.2 (ref 1.2–2.2)
Albumin: 4.2 g/dL (ref 3.8–4.9)
Alkaline Phosphatase: 102 IU/L (ref 44–121)
BUN/Creatinine Ratio: 13 (ref 9–20)
BUN: 17 mg/dL (ref 6–24)
Bilirubin Total: 0.4 mg/dL (ref 0.0–1.2)
CO2: 22 mmol/L (ref 20–29)
Calcium: 9.9 mg/dL (ref 8.7–10.2)
Chloride: 100 mmol/L (ref 96–106)
Creatinine, Ser: 1.33 mg/dL — ABNORMAL HIGH (ref 0.76–1.27)
Globulin, Total: 3.4 g/dL (ref 1.5–4.5)
Glucose: 102 mg/dL — ABNORMAL HIGH (ref 70–99)
Potassium: 4.7 mmol/L (ref 3.5–5.2)
Sodium: 139 mmol/L (ref 134–144)
Total Protein: 7.6 g/dL (ref 6.0–8.5)
eGFR: 64 mL/min/{1.73_m2} (ref >59)

## 2022-01-24 LAB — HEMOGLOBIN A1C
Est. average glucose Bld gHb Est-mCnc: 154 mg/dL
Hgb A1c MFr Bld: 7 % — ABNORMAL HIGH (ref 4.8–5.6)

## 2022-01-24 LAB — LIPID PANEL
Chol/HDL Ratio: 3.9 ratio (ref 0.0–5.0)
Cholesterol, Total: 158 mg/dL (ref 100–199)
HDL: 41 mg/dL (ref >39)
LDL Chol Calc (NIH): 95 mg/dL (ref 0–99)
Triglycerides: 120 mg/dL (ref 0–149)
VLDL Cholesterol Cal: 22 mg/dL (ref 5–40)

## 2022-01-24 LAB — MICROALBUMIN / CREATININE URINE RATIO
Creatinine, Urine: 64.8 mg/dL
Microalb/Creat Ratio: 6 mg/g creat (ref 0–29)
Microalbumin, Urine: 3.8 ug/mL

## 2022-01-24 LAB — PSA: Prostate Specific Ag, Serum: <0.1 ng/mL (ref 0.0–4.0)

## 2022-01-24 MED ORDER — RABEPRAZOLE SODIUM 20 MG PO TBEC: 20.0000 mg | DELAYED_RELEASE_TABLET | Freq: Every day | ORAL | 0 refills | Status: DC

## 2022-01-24 MED ORDER — VITAMIN B-12 1000 MCG PO TABS: 1000.0000 ug | ORAL_TABLET | Freq: Every day | ORAL | 0 refills | Status: DC

## 2022-01-24 MED ORDER — CARVEDILOL 12.5 MG PO TABS: 12.5000 mg | ORAL_TABLET | Freq: Two times a day (BID) | ORAL | 3 refills | Status: DC

## 2022-01-24 MED ORDER — LOSARTAN POTASSIUM-HCTZ 100-12.5 MG PO TABS: 1.0000 | ORAL_TABLET | Freq: Every day | ORAL | 3 refills | Status: DC

## 2022-01-24 MED ORDER — EMPAGLIFLOZIN 25 MG PO TABS: 25.0000 mg | ORAL_TABLET | Freq: Every day | ORAL | 3 refills | Status: DC

## 2022-01-26 ENCOUNTER — Telehealth: Payer: Self-pay | Admitting: Medical

## 2022-01-26 NOTE — Telephone Encounter (Signed)
P.A. JARDIANCE

## 2022-02-05 ENCOUNTER — Encounter: Payer: Self-pay | Admitting: Internal Medicine

## 2022-02-09 ENCOUNTER — Telehealth: Payer: Self-pay | Admitting: Medical

## 2022-02-09 NOTE — Telephone Encounter (Signed)
No response from P.A. so resubmitted

## 2022-02-14 ENCOUNTER — Telehealth: Payer: Self-pay

## 2022-02-14 NOTE — Telephone Encounter (Signed)
Pt. Wife called stating she was checking on the PA for the pts. Victoza. She called the pharmacy and they told her they were waiting to her back from [place] regarding the PA for that medication.

## 2022-02-15 ENCOUNTER — Other Ambulatory Visit: Payer: Self-pay | Admitting: Medical

## 2022-02-15 MED ORDER — VICTOZA 18 MG/3ML ~~LOC~~ SOPN: 1.8000 mg | PEN_INJECTOR | Freq: Every day | SUBCUTANEOUS | 0 refills | Status: DC

## 2022-02-15 MED ORDER — VICTOZA 18 MG/3ML ~~LOC~~ SOPN: 2.4000 mg | PEN_INJECTOR | Freq: Every day | SUBCUTANEOUS | 0 refills | Status: DC

## 2022-02-15 NOTE — Telephone Encounter (Signed)
P.A. approved til 02/10/23, sent mychart message

## 2022-02-15 NOTE — Telephone Encounter (Signed)
P.A. VICTOZA approved til 02/10/23, sent mychart message

## 2022-02-23 NOTE — Telephone Encounter (Signed)
See telephone call for P.A. Victoza approved

## 2022-03-02 ENCOUNTER — Telehealth: Payer: Self-pay | Admitting: Medical

## 2022-03-02 NOTE — Telephone Encounter (Signed)
P.A. RABEPRAZOLE  °

## 2022-03-08 NOTE — Telephone Encounter (Signed)
P.A. not required, covered by plan

## 2022-04-02 ENCOUNTER — Telehealth: Payer: Self-pay | Admitting: Family Medicine

## 2022-04-02 NOTE — Telephone Encounter (Signed)
Pt wife called.  Pt cannot tolerate the Victoza daily and wants to go back to the Ozempic.

## 2022-04-03 ENCOUNTER — Other Ambulatory Visit: Payer: Self-pay | Admitting: Medical

## 2022-04-03 MED ORDER — SEMAGLUTIDE (1 MG/DOSE) 4 MG/3ML ~~LOC~~ SOPN: 1.0000 mg | PEN_INJECTOR | SUBCUTANEOUS | 0 refills | Status: DC

## 2022-04-03 MED ORDER — OZEMPIC (2 MG/DOSE) 8 MG/3ML ~~LOC~~ SOPN: 2.0000 mg | PEN_INJECTOR | SUBCUTANEOUS | 1 refills | Status: DC

## 2022-04-07 ENCOUNTER — Telehealth: Payer: Self-pay | Admitting: Medical

## 2022-04-07 NOTE — Telephone Encounter (Signed)
P.A. OZEMPIC 

## 2022-04-24 ENCOUNTER — Ambulatory Visit: Payer: 59 | Admitting: Medical

## 2022-04-25 NOTE — Telephone Encounter (Signed)

## 2022-04-26 ENCOUNTER — Encounter: Payer: Self-pay | Admitting: Medical

## 2022-05-03 NOTE — Telephone Encounter (Signed)
P.A. Larna Daughters approved til 04/16/23, called pharmacy went thru $25, pt informed

## 2022-05-03 NOTE — Telephone Encounter (Signed)
P.A. approved til 04/16/23, called pharmacy went thru $25, pt informed

## 2022-05-10 ENCOUNTER — Telehealth: Payer: Self-pay | Admitting: Medical

## 2022-05-10 NOTE — Telephone Encounter (Signed)
Additional info, Christopher Skinner insurance was put in his chart but also the subscriber is his spouse Christopher Skinner dob 02/13/1974

## 2022-05-10 NOTE — Telephone Encounter (Signed)
Christopher Skinner is needing a PA for his Ozempic. He has [employer] and provided id: [id number]

## 2022-05-12 ENCOUNTER — Telehealth: Payer: Self-pay | Admitting: Medical

## 2022-05-12 NOTE — Telephone Encounter (Signed)
P.A. OZEMPIC (NEW INS)

## 2022-05-13 ENCOUNTER — Encounter: Payer: Self-pay | Admitting: Medical

## 2022-05-13 ENCOUNTER — Telehealth (INDEPENDENT_AMBULATORY_CARE_PROVIDER_SITE_OTHER): Payer: 59 | Admitting: Medical

## 2022-05-13 DIAGNOSIS — E1165 Type 2 diabetes mellitus with hyperglycemia: Secondary | ICD-10-CM

## 2022-05-13 DIAGNOSIS — Z7185 Encounter for immunization safety counseling: Secondary | ICD-10-CM

## 2022-05-13 DIAGNOSIS — I700 Atherosclerosis of aorta: Secondary | ICD-10-CM

## 2022-05-13 DIAGNOSIS — E782 Mixed hyperlipidemia: Secondary | ICD-10-CM

## 2022-05-13 DIAGNOSIS — I10 Essential (primary) hypertension: Secondary | ICD-10-CM

## 2022-05-13 DIAGNOSIS — Z2820 Immunization not carried out because of patient decision for unspecified reason: Secondary | ICD-10-CM

## 2022-05-13 DIAGNOSIS — K219 Gastro-esophageal reflux disease without esophagitis: Secondary | ICD-10-CM | POA: Diagnosis not present

## 2022-05-13 MED ORDER — OMEPRAZOLE 40 MG PO CPDR: 40.0000 mg | DELAYED_RELEASE_CAPSULE | Freq: Every day | ORAL | 3 refills | Status: DC

## 2022-05-13 MED ORDER — OZEMPIC (2 MG/DOSE) 8 MG/3ML ~~LOC~~ SOPN: 2.0000 mg | PEN_INJECTOR | SUBCUTANEOUS | 1 refills | Status: DC

## 2022-05-13 NOTE — Progress Notes (Signed)
Subjective:     Patient ID: Christopher Skinner, male   DOB: 1970-11-22, 52 y.o.   MRN: [id number]  This visit type was conducted due to national recommendations for restrictions regarding the COVID-19 Pandemic (e.g. social distancing) in an effort to limit this patient's exposure and mitigate transmission in our community.  Due to their co-morbid illnesses, this patient is at least at moderate risk for complications without adequate follow up.  This format is felt to be most appropriate for this patient at this time.    Documentation for virtual audio and video telecommunications through [place] encounter:  The patient was located at home. The provider was located in the office. The patient did consent to this visit and is aware of possible charges through their insurance for this visit.  The other persons participating in this telemedicine service were wife Time spent on call was 20 minutes and in review of previous records 20 minutes total.  This virtual service is not related to other E/M service within previous 7 days.   HPI Chief Complaint  Patient presents with   Follow-up    Medication recheck. Wants to discuss changing to a different medication to help with Acid reflux. Currently taking Aciphex. Wants to change to Omeprazole.    Gastroesophageal Reflux   Virtual consult for medication concerns.  Accompanied by wife.  He would like to change back to omeprazole.  He was on omeprazole in the past but at some point insurance required change over to Aciphex.  He does not think it works as well.  He would like to go back to omeprazole at this time  Diabetes-he has been on Ozempic about a month and doing wellness so far.  No particular major issues.  He does feel a decrease in appetite.  He is not sure if he has lost weight yet as he has not checked his weight.  He thinks he probably has though.  Compliant with Jardiance as well  Hypertension-compliant with medication without  complaint, losartan HCT and Coreg  Hyperlipidemia-compliant with Lipitor 80 mg daily without complaint  Past Medical History:  Diagnosis Date   COVID-19 05/02/2019   Diabetes mellitus without complication ([facility]) 9528   GERD (gastroesophageal reflux disease)    H/O exercise stress test 2009   reportedly normal per patient   Hyperlipidemia    Hypertension 2008   Microalbuminuria 2019   Obesity    Sleep apnea    last used CPAP 2014; quit on his own   Current Outpatient Medications on File Prior to Visit  Medication Sig Dispense Refill   albuterol (PROVENTIL HFA;VENTOLIN HFA) 108 (90 Base) MCG/ACT inhaler Inhale 2 puffs into the lungs every 4 (four) hours as needed for wheezing or shortness of breath. 1 Inhaler 0   atorvastatin (LIPITOR) 80 MG tablet Take 1 tablet (80 mg total) by mouth daily. 90 tablet 3   blood glucose meter kit and supplies KIT Dispense based on patient and insurance preference. Use 1-2 times daily as directed. (FOR ICD-10 DE11.69). 1 each 0   Blood Glucose Monitoring Suppl (TRUE METRIX METER) DEVI 1 Device by Does not apply route daily. 1 each 0   carvedilol (COREG) 12.5 MG tablet Take 1 tablet (12.5 mg total) by mouth 2 (two) times daily with a meal. 180 tablet 3   cyanocobalamin (VITAMIN B12) 1000 MCG tablet Take 1 tablet (1,000 mcg total) by mouth daily. 90 tablet 0   empagliflozin (JARDIANCE) 25 MG TABS tablet Take 1 tablet (25 mg  total) by mouth daily before breakfast. 90 tablet 3   glucose blood test strip True metrix strips to test 1-2 times daily 100 each 12   Lancets MISC Use as instructed. Needs lancets based on pt's meter (true metrix) 100 each 12   losartan-hydrochlorothiazide (HYZAAR) 100-12.5 MG tablet Take 1 tablet by mouth daily. 90 tablet 3   RABEprazole (ACIPHEX) 20 MG tablet Take 1 tablet (20 mg total) by mouth daily. 90 tablet 0   No current facility-administered medications on file prior to visit.    Review of Systems As in subjective     Objective:   Physical Exam Due to coronavirus pandemic stay at home measures, patient visit was virtual and they were not examined in person.   There were no vitals taken for this visit.  Wt Readings from Last 3 Encounters:  01/23/22 (!) 465 lb 3.2 oz (211 kg)  10/16/21 (!) 469 lb 3.2 oz (212.8 kg)  09/28/21 (!) 468 lb 11.2 oz (212.6 kg)   Gen: Well-developed, well-nourished, no acute distress     Assessment:     Encounter Diagnoses  Name Primary?   Essential hypertension, benign Yes   Aortic atherosclerosis (HCC)    Gastroesophageal reflux disease, unspecified whether esophagitis present    Type 2 diabetes mellitus with hyperglycemia, without long-term current use of insulin (HCC)    Mixed hyperlipidemia    Vaccine refused by patient    Vaccine counseling        Plan:     Hypertension-continue losartan HCT 100/12.5 mg daily, carvedilol 12.5 mg twice daily  Hyperlipidemia-continue Lipitor 80 mg daily  GERD-discontinue Aciphex as this was not covered by insurance and not helping.  Change back to omeprazole which she has taken before with better response.  We also discussed the GLP-1 medications tend to worsen acid reflux.  Continue avoid food triggers for acid reflux.  Diabetes-so far doing relatively okay on Ozempic.  He has tried other GLP-1's in the past and failed Victoza and other trials.  Continue Ozempic weekly.  We discussed potential side effects, work on getting exercise and continue efforts to lose weight  Advised the way himself occasionally as we should see some weight loss on the Ozempic  We discussed vaccine recommendations be declines vaccines  Aortic atherosclerosis-continue statin  Geoge was seen today for follow-up and gastroesophageal reflux.  Diagnoses and all orders for this visit:  Essential hypertension, benign  Aortic atherosclerosis (HCC)  Gastroesophageal reflux disease, unspecified whether esophagitis present  Type 2 diabetes  mellitus with hyperglycemia, without long-term current use of insulin (HCC)  Mixed hyperlipidemia  Vaccine refused by patient  Vaccine counseling  Other orders -     omeprazole (PRILOSEC) 40 MG capsule; Take 1 capsule (40 mg total) by mouth daily. -     Semaglutide, 2 MG/DOSE, (OZEMPIC, 2 MG/DOSE,) 8 MG/3ML SOPN; Inject 2 mg into the skin once a week.  F/u in 1-2 months in person

## 2022-05-14 NOTE — Telephone Encounter (Signed)
Medical records faxed & forms completed for P.A faxed

## 2022-05-17 NOTE — Telephone Encounter (Signed)
PA done & approved, wife informed

## 2022-05-29 ENCOUNTER — Other Ambulatory Visit: Payer: Self-pay | Admitting: Medical

## 2022-06-07 ENCOUNTER — Telehealth: Payer: Self-pay | Admitting: Medical

## 2022-06-07 NOTE — Telephone Encounter (Signed)
PA completed.

## 2022-06-07 NOTE — Telephone Encounter (Signed)
Wife called & states new ins requiring P.A. for JARDIANCE also. Pt is out of his meds, gave #3 samples to hold

## 2022-06-19 NOTE — Telephone Encounter (Signed)
P.A. approved til 06/09/23, pt informed

## 2022-06-27 ENCOUNTER — Ambulatory Visit (INDEPENDENT_AMBULATORY_CARE_PROVIDER_SITE_OTHER): Payer: 59 | Admitting: Medical

## 2022-06-27 ENCOUNTER — Telehealth: Payer: Self-pay | Admitting: Medical

## 2022-06-27 ENCOUNTER — Other Ambulatory Visit: Payer: Self-pay

## 2022-06-27 VITALS — BP 110/80 | HR 90 | Temp 97.4°F | Wt >= 6400 oz

## 2022-06-27 DIAGNOSIS — H938X3 Other specified disorders of ear, bilateral: Secondary | ICD-10-CM | POA: Diagnosis not present

## 2022-06-27 DIAGNOSIS — H9313 Tinnitus, bilateral: Secondary | ICD-10-CM | POA: Diagnosis not present

## 2022-06-27 MED ORDER — POLYMYXIN B-TRIMETHOPRIM 10000-0.1 UNIT/ML-% OP SOLN: 1.0000 [drp] | OPHTHALMIC | 0 refills | Status: DC

## 2022-06-27 MED ORDER — FLUTICASONE PROPIONATE 50 MCG/ACT NA SUSP: 2.0000 | Freq: Every day | NASAL | 0 refills | Status: DC

## 2022-06-27 NOTE — Patient Instructions (Addendum)
Hearing test shows mild decrease at high frequencies  Begin short term Fluticasone Flonase daily in the morning for the next 1-2 weeks Begin over the counter allergy pill at bedtime such as zyrtec or allegra for the next 1-2 weeks  If worse ear pain over the next 2-3 days, then begin ear drops   If symptoms resolve with just the Flonase and allergy pill, then great  Be good about water intake

## 2022-06-27 NOTE — Progress Notes (Signed)
Subjective:  Christopher Skinner is a 52 y.o. male who presents for Chief Complaint  Patient presents with   Tinnitus    Been going on for over a week. Happens on and off all the time. Feels foggy,      Here for ringing in ear, cloudy head feeling, for past week.  No drainage, no sinus pressure, no fever, no headache, no cough or sore throat.  No recent loud noise exposure.  No other aggravating or relieving factors.    No other c/o.  The following portions of the patient's history were reviewed and updated as appropriate: allergies, current medications, past family history, past medical history, past social history, past surgical history and problem list.  ROS Otherwise as in subjective above  Objective: BP 110/80   Pulse 90   Temp (!) 97.4 F (36.3 C)   Wt (!) 479 lb (217.3 kg)   BMI 70.74 kg/m   General appearance: alert, no distress, well developed, well nourished HEENT: normocephalic, sclerae anicteric, conjunctiva pink and moist, TMs with decreased light reflex, but no erythema, nares patent, no discharge or erythema, pharynx normal Oral cavity: MMM, no lesions Neck: supple, no lymphadenopathy, no thyromegaly, no masses    Assessment: Encounter Diagnoses  Name Primary?   Tinnitus of both ears Yes   Pressure sensation in both ears      Plan: Discussed symptoms, concerns, possible causes.  Begin recommendations as below  Patient Instructions  Hearing test shows mild decrease at high frequencies  Begin short term Fluticasone Flonase daily in the morning for the next 1-2 weeks Begin over the counter allergy pill at bedtime such as zyrtec or allegra for the next 1-2 weeks  If worse ear pain over the next 2-3 days, then begin ear drops   If symptoms resolve with just the Flonase and allergy pill, then great  Be good about water intake   Christopher Skinner was seen today for tinnitus.  Diagnoses and all orders for this visit:  Tinnitus of both ears  Pressure  sensation in both ears  Other orders -     trimethoprim-polymyxin b (POLYTRIM) ophthalmic solution; Place 1 drop into the left eye every 4 (four) hours. -     fluticasone (FLONASE) 50 MCG/ACT nasal spray; Place 2 sprays into both nostrils daily.    Follow up: prn

## 2022-06-27 NOTE — Telephone Encounter (Signed)
Shane accidentally sent in ear drops for Christopher Skinner and he was seen for ear pain. Can someone help with this?

## 2022-06-27 NOTE — Telephone Encounter (Signed)
I was asked by Beverlee Nims to change the pts. Instructions on his drops to put them in the ear instead of the eye. I also had Emeterio Reeve review this with me and got the ok to change his drops to in the ear and sent that electronically to the pharmacy.

## 2022-07-25 NOTE — Progress Notes (Deleted)
   Shirlyn Goltz, PhD, LAT, ATC acting as a scribe for Lynne Leader, MD.  Christopher Skinner is a 52 y.o. male who presents to [facility] today for left knee pain.  Patient was previously seen by Dr. Georgina Snell on 07/03/2021 for a fracture of his left ankle and prior in 2021 for right knee pain.  Today, patient reports left knee pain x***.  Patient locates pain to insert here  L Knee swelling: Mechanical symptoms: Radiates: Aggravates: Treatments tried:  Pertinent review of systems: ***  Relevant historical information: ***   Exam:  There were no vitals taken for this visit. General: Well Developed, well nourished, and in no acute distress.   MSK: ***    Lab and Radiology Results No results found for this or any previous visit (from the past 72 hour(s)). No results found.     Assessment and Plan: 52 y.o. male with ***   PDMP not reviewed this encounter. No orders of the defined types were placed in this encounter.  No orders of the defined types were placed in this encounter.    Discussed warning signs or symptoms. Please see discharge instructions. Patient expresses understanding.   ***

## 2022-07-29 ENCOUNTER — Ambulatory Visit: Payer: Medicaid Other | Admitting: Family Medicine

## 2022-07-29 ENCOUNTER — Other Ambulatory Visit: Payer: Self-pay | Admitting: Medical

## 2022-08-20 NOTE — Progress Notes (Unsigned)
Rubin Payor, PhD, LAT, ATC acting as a scribe for Clementeen Graham, MD.  Christopher Skinner is a 52 y.o. male who presents to [facility] today for left knee pain.  Patient was previously seen by Dr. Denyse Amass on 07/03/2021 for a fracture of his left ankle and prior in 2021 for right knee pain.  Today, patient reports left knee pain x3 wks. He thinks he may have "twisted" his knee the wrong way. Patient locates pain to the anterior-medial aspect of his L knee.  L Knee swelling: no Mechanical symptoms: yes Aggravates: walking, steps Treatments tried: naproxen  He has a custom off loader knee brace for his right knee.  He has tried using on his left but it does not fit right.  Pertinent review of systems: No fevers or chills  Relevant historical information: Hypertension, obesity.  Diabetes.   Exam:  BP (!) 194/102   Pulse 93   Ht  (1.753 m)   Wt (!) 484 lb (219.5 kg)   SpO2 97%   BMI 71.47 kg/m  General: Obese male no acute distress.  MSK: Left knee mild effusion normal motion with crepitation.  Tender palpation medial joint line. Some laxity MCL stress test. Positive medial McMurray's test. Intact strength.    Lab and Radiology Results  Procedure: Real-time Ultrasound Guided Injection of left knee joint superior lateral patellar space Device: Philips Affiniti 50G Images permanently stored and available for review in PACS Verbal informed consent obtained.  Discussed risks and benefits of procedure. Warned about infection, bleeding, hyperglycemia damage to structures among others. Patient expresses understanding and agreement Time-out conducted.   Noted no overlying erythema, induration, or other signs of local infection.   Skin prepped in a sterile fashion.   Local anesthesia: Topical Ethyl chloride.   With sterile technique and under real time ultrasound guidance: 40 mg of Kenalog and 2 mL's of Marcaine injected into knee joint.  Fluid seen entering the joint capsule.   Completed without difficulty   Pain moderately resolved suggesting accurate placement of the medication.   Advised to call if fevers/chills, erythema, induration, drainage, or persistent bleeding.   Images permanently stored and available for review in the ultrasound unit.  Impression: Technically successful ultrasound guided injection.   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  No acute fractures are visible. Await formal radiology review      Assessment and Plan: 52 y.o. male with acute left knee pain.  Etiology is unclear.  Concerning for developing medial compartment DJD or perhaps a meniscus tear.  He does have a little bit of laxity on exam.  Plan for steroid injection today and Voltaren gel.  Recommend avoiding oral ibuprofen as I think it is contributing to his hypertension.  I have prescribed hydrocodone to use as needed for severe pain.  Recheck in 1 month.  If needed we could consider medial off loader knee brace for his left knee.  That has benefited his right.  He does have some laxity and DJD seen on exam.   PDMP reviewed during this encounter. Orders Placed This Encounter  Procedures   [place] LIMITED JOINT SPACE STRUCTURES LOW LEFT(NO LINKED CHARGES)    Order Specific Question:   Reason for Exam (SYMPTOM  OR DIAGNOSIS REQUIRED)    Answer:   left knee pain    Order Specific Question:   Preferred imaging location?  Answer:   [facility] Knee AP/LAT W/[place] Left    Standing Status:   Future    Number of Occurrences:   1    Standing Expiration Date:   09/20/2022    Order Specific Question:   Reason for Exam (SYMPTOM  OR DIAGNOSIS REQUIRED)    Answer:   left knee pain    Order Specific Question:   Preferred imaging location?    Answer:   [facility]   Meds ordered this encounter   Medications   HYDROcodone-acetaminophen (NORCO/VICODIN) 5-325 MG tablet    Sig: Take 1 tablet by mouth every 6 (six) hours as needed.    Dispense:  15 tablet    Refill:  0     Discussed warning signs or symptoms. Please see discharge instructions. Patient expresses understanding.   The above documentation has been reviewed and is accurate and complete Clementeen Graham, M.D.

## 2022-08-21 ENCOUNTER — Other Ambulatory Visit: Payer: Self-pay

## 2022-08-21 ENCOUNTER — Ambulatory Visit (INDEPENDENT_AMBULATORY_CARE_PROVIDER_SITE_OTHER): Payer: 59

## 2022-08-21 ENCOUNTER — Ambulatory Visit: Payer: 59 | Admitting: Family Medicine

## 2022-08-21 VITALS — BP 194/102 | HR 93 | Ht 69.0 in | Wt >= 6400 oz

## 2022-08-21 DIAGNOSIS — M1712 Unilateral primary osteoarthritis, left knee: Secondary | ICD-10-CM | POA: Diagnosis not present

## 2022-08-21 DIAGNOSIS — M25562 Pain in left knee: Secondary | ICD-10-CM | POA: Diagnosis not present

## 2022-08-21 MED ORDER — HYDROCODONE-ACETAMINOPHEN 5-325 MG PO TABS: 1.0000 | ORAL_TABLET | Freq: Four times a day (QID) | ORAL | 0 refills | Status: DC | PRN

## 2022-08-21 NOTE — Patient Instructions (Signed)
Thank you for coming in today.   Please get an Xray today before you leave   You received an injection today. Seek immediate medical attention if the joint becomes red, extremely painful, or is oozing fluid.   Check back in 1 month. Let me know sooner if not improving

## 2022-08-26 NOTE — Progress Notes (Signed)
Left knee x-ray shows evidence of tendinitis in the front of the knee.

## 2022-08-31 ENCOUNTER — Other Ambulatory Visit: Payer: Self-pay | Admitting: Medical

## 2022-09-18 ENCOUNTER — Ambulatory Visit: Payer: 59 | Admitting: Family Medicine

## 2022-09-19 ENCOUNTER — Other Ambulatory Visit: Payer: Self-pay | Admitting: Medical

## 2022-12-04 ENCOUNTER — Telehealth: Payer: Self-pay | Admitting: Medical

## 2022-12-04 NOTE — Telephone Encounter (Signed)
Py is needing a refill on ozempic to [facility] - [place], [place] - [address]. He hasn't gotten it in awhile due to the pharmacy being out of stock but they have some available now.

## 2022-12-04 NOTE — Telephone Encounter (Signed)
He needs an appointment to be seen. He has not had his diabetes check since last year so he is overdue for an appointment and can discuss this at that time.

## 2022-12-05 NOTE — Telephone Encounter (Signed)
Called pt to schedule. No answer, left voicemail.

## 2022-12-18 ENCOUNTER — Encounter: Payer: 59 | Admitting: Medical

## 2022-12-23 ENCOUNTER — Ambulatory Visit (INDEPENDENT_AMBULATORY_CARE_PROVIDER_SITE_OTHER): Payer: 59 | Admitting: Medical

## 2022-12-23 VITALS — BP 112/70 | HR 84 | Wt >= 6400 oz

## 2022-12-23 DIAGNOSIS — I700 Atherosclerosis of aorta: Secondary | ICD-10-CM

## 2022-12-23 DIAGNOSIS — E782 Mixed hyperlipidemia: Secondary | ICD-10-CM

## 2022-12-23 DIAGNOSIS — Z0000 Encounter for general adult medical examination without abnormal findings: Secondary | ICD-10-CM

## 2022-12-23 DIAGNOSIS — R809 Proteinuria, unspecified: Secondary | ICD-10-CM

## 2022-12-23 DIAGNOSIS — K219 Gastro-esophageal reflux disease without esophagitis: Secondary | ICD-10-CM

## 2022-12-23 DIAGNOSIS — Z125 Encounter for screening for malignant neoplasm of prostate: Secondary | ICD-10-CM

## 2022-12-23 DIAGNOSIS — E1165 Type 2 diabetes mellitus with hyperglycemia: Secondary | ICD-10-CM

## 2022-12-23 DIAGNOSIS — I10 Essential (primary) hypertension: Secondary | ICD-10-CM

## 2022-12-23 DIAGNOSIS — Z7185 Encounter for immunization safety counseling: Secondary | ICD-10-CM | POA: Diagnosis not present

## 2022-12-23 DIAGNOSIS — G4733 Obstructive sleep apnea (adult) (pediatric): Secondary | ICD-10-CM

## 2022-12-23 LAB — POCT URINALYSIS DIP (PROADVANTAGE DEVICE)
Bilirubin, UA: NEGATIVE
Blood, UA: NEGATIVE
Glucose, UA: >=1000 mg/dL — AB
Ketones, POC UA: NEGATIVE mg/dL
Leukocytes, UA: NEGATIVE
Nitrite, UA: NEGATIVE
Protein Ur, POC: NEGATIVE mg/dL
Specific Gravity, Urine: 1.015
Urobilinogen, Ur: NEGATIVE
pH, UA: 6 (ref 5.0–8.0)

## 2022-12-23 MED ORDER — EMPAGLIFLOZIN 25 MG PO TABS: 25.0000 mg | ORAL_TABLET | Freq: Every day | ORAL | 2 refills | Status: DC

## 2022-12-23 MED ORDER — ASPIRIN 81 MG PO TBEC: 81.0000 mg | DELAYED_RELEASE_TABLET | Freq: Every day | ORAL | 2 refills | Status: DC

## 2022-12-23 MED ORDER — ATORVASTATIN CALCIUM 80 MG PO TABS: 80.0000 mg | ORAL_TABLET | Freq: Every day | ORAL | 2 refills | Status: DC

## 2022-12-23 MED ORDER — OZEMPIC (2 MG/DOSE) 8 MG/3ML ~~LOC~~ SOPN: 2.0000 mg | PEN_INJECTOR | SUBCUTANEOUS | 1 refills | Status: DC

## 2022-12-23 MED ORDER — CARVEDILOL 12.5 MG PO TABS
12.5000 mg | ORAL_TABLET | Freq: Two times a day (BID) | ORAL | 2 refills | Status: DC
Start: 2022-12-23 — End: 2023-12-03

## 2022-12-23 MED ORDER — VITAMIN B-12 1000 MCG PO TABS: 1000.0000 ug | ORAL_TABLET | Freq: Every day | ORAL | 2 refills | Status: AC

## 2022-12-23 MED ORDER — LOSARTAN POTASSIUM-HCTZ 100-12.5 MG PO TABS
1.0000 | ORAL_TABLET | Freq: Every day | ORAL | 2 refills | Status: DC
Start: 2022-12-23 — End: 2023-12-03

## 2022-12-23 MED ORDER — OMEPRAZOLE 40 MG PO CPDR: 40.0000 mg | DELAYED_RELEASE_CAPSULE | Freq: Every day | ORAL | 2 refills | Status: DC

## 2022-12-23 NOTE — Addendum Note (Signed)
Addended by: Herminio Commons A on: 12/23/2022 10:22 AM   Modules accepted: Orders

## 2022-12-23 NOTE — Patient Instructions (Addendum)
Ophthalmologists [facility] [address] [place], [place], [place] [address] [phone]  [facility] [address] [place], [place] [address] [phone]  [facility] [address] [place], [place] [address] [phone]  [facility]  [address] [place], [place], [place] [address] [phone]  [facility] [address], [place], [place] [address] [phone]    This visit was a preventative care visit, also known as wellness visit or routine physical.   Topics typically include healthy lifestyle, diet, exercise, preventative care, vaccinations, sick and well care, proper use of emergency dept and after hours care, as well as other concerns.     Separate significant issues discussed: Diabetes-updated labs today.  Continue Jardiance 25 mg daily.  I advise he let us know if the pharmacy is having trouble keeping Ozempic in stock.  Continue Ozempic.  Refilled prescription for lancets and strips today so he can check his blood sugar more regularly.    Hypertension-blood pressure at goal, continue losartan HCT 100/12.5 mg daily, carvedilol 12.5 mg twice daily  Hyperlipidemia-continue atorvastatin 80 mg daily and aspirin 81 mg daily, routine labs today  GERD-he continues on omeprazole.  I recommend losing weight  OSA-wife notes no recent problems with breathing.  Not currently on CPAP  Obesity-I recommend activity and exercise several days per week.  I recommend very strict diet and efforts to lose weight.  Lets be more consistent with Ozempic to help lose weight.  Consider bariatric surgery.  Consider working with a trainer or specific diet plan such as Atkins or whole 30 or some other plan to lose weight  General Recommendations: Continue to return yearly for your annual wellness and preventative care visits.  This gives [place] a chance to discuss healthy lifestyle, exercise, vaccinations, review your chart record, and perform  screenings where appropriate.  I recommend you see your eye doctor yearly for routine vision care.  I recommend you see your dentist yearly for routine dental care including hygiene visits twice yearly.   Vaccination  Immunization History  Administered Date(s) Administered   Tdap 10/05/2018    Vaccine recommendations: Shingrix, flu, but he declines vaccines   Screening for cancer: Colon cancer screening: 01/2021 colonoscopy reviewed, repeat in 10 years  Prostate Cancer screening: The recommended prostate cancer screening test is a blood test called the prostate-specific antigen (PSA) test. PSA is a protein that is made in the prostate. As you age, your prostate naturally produces more PSA. Abnormally high PSA levels may be caused by: Prostate cancer. An enlarged prostate that is not caused by cancer (benign prostatic hyperplasia, or BPH). This condition is very common in older men. A prostate gland infection (prostatitis) or urinary tract infection. Certain medicines such as male hormones (like testosterone) or other medicines that raise testosterone levels. A rectal exam may be done as part of prostate cancer screening to help provide information about the size of your prostate gland. When a rectal exam is performed, it should be done after the PSA level is drawn to avoid any effect on the results.   Skin cancer screening: Check your skin regularly for new changes, growing lesions, or other lesions of concern Come in for evaluation if you have skin lesions of concern.   Lung cancer screening: If you have a greater than 20 pack year history of tobacco use, then you may qualify for lung cancer screening with a chest CT scan.   Please call your insurance company to inquire about coverage for this test.  Pancreatic cancer:  no current screening test is available or routinely recommended. (risk factors: smoking, overweight or obese, diabetes, chronic pancreatitis, work exposure -  dry cleaning, metal working, 52yo>, M>F, Tree surgeon, family hx/o, hereditary breast, ovarian, melanoma, lynch, peutz-jeghers).  Symptoms: jaundice, dark urine, light color or greasy stools, itchy skin, belly or back pain, weight loss, poor appetite, nausea, vomiting, liver enlargement, DVT/blood clots.   We currently don't have screenings for other cancers besides breast, cervical, colon, and lung cancers.  If you have a strong family history of cancer or have other cancer screening concerns, please let me know.  Genetic testing referral is an option for individuals with high cancer risk in the family.  There are some other cancer screenings in development currently.   Bone health: Get at least 150 minutes of aerobic exercise weekly Get weight bearing exercise at least once weekly Bone density test:  A bone density test is an imaging test that uses a type of X-ray to measure the amount of calcium and other minerals in your bones. The test may be used to diagnose or screen you for a condition that causes weak or thin bones (osteoporosis), predict your risk for a broken bone (fracture), or determine how well your osteoporosis treatment is working. The bone density test is recommended for females 65 and older, or females or males <65 if certain risk factors such as thyroid disease, long term use of steroids such as for asthma or rheumatological issues, vitamin D deficiency, estrogen deficiency, family history of osteoporosis, self or family history of fragility fracture in first degree relative.    Heart health: Get at least 150 minutes of aerobic exercise weekly Limit alcohol It is important to maintain a healthy blood pressure and healthy cholesterol numbers  Heart disease screening: Screening for heart disease includes screening for blood pressure, fasting lipids, glucose/diabetes screening, BMI height to weight ratio, reviewed of smoking status, physical activity, and diet.    Goals  include blood pressure 120/80 or less, maintaining a healthy lipid/cholesterol profile, preventing diabetes or keeping diabetes numbers under good control, not smoking or using tobacco products, exercising most days per week or at least 150 minutes per week of exercise, and eating healthy variety of fruits and vegetables, healthy oils, and avoiding unhealthy food choices like fried food, fast food, high sugar and high cholesterol foods.    Echocardiogram 06/13/2019 shows LVEF 55 to 60%, normal function, no wall motion abnormalities there is some mild aortic valve sclerosis   Vascular disease screening: For higher risk individuals including smokers, diabetics, patients with known heart disease or high blood pressure, kidney disease, and others, screening for vascular disease or atherosclerosis of the arteries is available.  Examples may include carotid ultrasound, abdominal aortic ultrasound, ABI blood flow screening in the legs, thoracic aorta screening.  I recommend and ABI blood flow screen in legs.   This can be ordered through vascular office.  Let me know if agreeable.    Medical care options: I recommend you continue to seek care here first for routine care.  We try really hard to have available appointments Monday through Friday daytime hours for sick visits, acute visits, and physicals.  Urgent care should be used for after hours and weekends for significant issues that cannot wait till the next day.  The emergency department should be used for significant potentially life-threatening emergencies.  The emergency department is expensive, can often have long wait times for less significant concerns, so try to utilize primary  care, urgent care, or telemedicine when possible to avoid unnecessary trips to the emergency department.  Virtual visits and telemedicine have been introduced since the pandemic started in 2020, and can be convenient ways to receive medical care.  We offer virtual appointments  as well to assist you in a variety of options to seek medical care.   Legal  Take the time to do a last will and testament, Advanced Directives including Health Care Power of Attorney and Living Will documents.  Don't leave your family with burdens that can be handled ahead of time.   Advanced Directives: I recommend you consider completing a Health Care Power of Attorney and Living Will.   These documents respect your wishes and help alleviate burdens on your loved ones if you were to become terminally ill or be in a position to need those documents enforced.    You can complete Advanced Directives yourself, have them notarized, then have copies made for our office, for you and for anybody you feel should have them in safe keeping.  Or, you can have an attorney prepare these documents.   If you haven't updated your Last Will and Testament in a while, it may be worthwhile having an attorney prepare these documents together and save on some costs.       Spiritual and Emotional Health Keeping a healthy spiritual life can help you better manage your physical health. Your spiritual life can help you to cope with any issues that may arise with your physical health.  Balance can keep [place] healthy and help [place] to recover.  If you are struggling with your spiritual health there are questions that you may want to ask yourself:  What makes me feel most complete? When do I feel most connected to the rest of the world? Where do I find the most inner strength? What am I doing when I feel whole?  Helpful tips: Being in nature. Some people feel very connected and at peace when they are walking outdoors or are outside. Helping others. Some feel the largest sense of wellbeing when they are of service to others. Being of service can take on many forms. It can be doing volunteer work, being kind to strangers, or offering a hand to a friend in need. Gratitude. Some people find they feel the most connected when  they remain grateful. They may make lists of all the things they are grateful for or say a thank you out loud for all they have.    Emotional Health Are you in tune with your emotional health?  Check out this link: http://www.marquez-love.com/    Financial Health Make sure you use a budget for your personal finances Make sure you are insured against risks (health insurance, life insurance, auto insurance, etc) Save more, spend less Set financial goals If you need help in this area, good resources include counseling through [employer] or other community resources, have a meeting with a Social research officer, government, and a good resource is the [employer]

## 2022-12-23 NOTE — Progress Notes (Signed)
Subjective:   HPI  Christopher Skinner is a 52 y.o. male who presents for Chief Complaint  Patient presents with   Medical Management of Chronic Issues    Med check- discuss weight loss- ozempic he was taking every 2 weeks to last him as he didn't have enough    Patient Care Team: Izen Petz, Kermit Balo, PA-C as PCP - General (Family Medicine) Lyn Records, MD (Inactive) as PCP - Cardiology (Cardiology) Dr. Levert Feinstein, neurology [facility] surgery Dr. Clementeen Graham, sports medicine Dr. Stan Head, GI   Concerns: Hyperlipidemia -  compliant with aspirin 81mg  daily and Atorvastatin Lipitor 80 mg daily  HTN - compliant with Carvedilol 12.5 mg twice daily, losartan HCT 100/12.5 mg daily  Diabetes - compliant with Jardiance 25 mg daily, uses Ozempic 2 mg weekly,  but pharmacy has trouble keeping it in stock, so he has numerous periods off and on medication.   He is not checking sugars of late. He does have glucometer.   Due for eye exam, is working to establish with new eye doctor   Reviewed their medical, surgical, family, social, medication, and allergy history and updated chart as appropriate.  Allergies  Allergen Reactions   Trulicity [Dulaglutide]     Rash.  Tolerates Ozempic well.   Metformin And Related     Bad loose stools even at low doses    Shellfish Allergy Nausea And Vomiting    Past Medical History:  Diagnosis Date   COVID-19 05/02/2019   Diabetes mellitus without complication (HCC) 2019   GERD (gastroesophageal reflux disease)    H/O exercise stress test 2009   reportedly normal per patient   Hyperlipidemia    Hypertension 2008   Microalbuminuria 2019   Obesity    Sleep apnea    last used CPAP 2014; quit on his own    Current Outpatient Medications on File Prior to Visit  Medication Sig Dispense Refill   albuterol (PROVENTIL HFA;VENTOLIN HFA) 108 (90 Base) MCG/ACT inhaler Inhale 2 puffs into the lungs every 4 (four) hours as needed for wheezing or  shortness of breath. 1 Inhaler 0   blood glucose meter kit and supplies KIT Dispense based on patient and insurance preference. Use 1-2 times daily as directed. (FOR ICD-10 DE11.69). 1 each 0   Blood Glucose Monitoring Suppl (TRUE METRIX METER) DEVI 1 Device by Does not apply route daily. 1 each 0   glucose blood test strip True metrix strips to test 1-2 times daily 100 each 12   Lancets MISC Use as instructed. Needs lancets based on pt's meter (true metrix) 100 each 12   No current facility-administered medications on file prior to visit.      Current Outpatient Medications:    albuterol (PROVENTIL HFA;VENTOLIN HFA) 108 (90 Base) MCG/ACT inhaler, Inhale 2 puffs into the lungs every 4 (four) hours as needed for wheezing or shortness of breath., Disp: 1 Inhaler, Rfl: 0   aspirin EC 81 MG tablet, Take 1 tablet (81 mg total) by mouth daily. Swallow whole., Disp: 90 tablet, Rfl: 2   atorvastatin (LIPITOR) 80 MG tablet, Take 1 tablet (80 mg total) by mouth daily., Disp: 90 tablet, Rfl: 2   blood glucose meter kit and supplies KIT, Dispense based on patient and insurance preference. Use 1-2 times daily as directed. (FOR ICD-10 DE11.69)., Disp: 1 each, Rfl: 0   Blood Glucose Monitoring Suppl (TRUE METRIX METER) DEVI, 1 Device by Does not apply route daily., Disp: 1 each, Rfl: 0  carvedilol (COREG) 12.5 MG tablet, Take 1 tablet (12.5 mg total) by mouth 2 (two) times daily with a meal., Disp: 180 tablet, Rfl: 2   cyanocobalamin (VITAMIN B12) 1000 MCG tablet, Take 1 tablet (1,000 mcg total) by mouth daily., Disp: 90 tablet, Rfl: 2   empagliflozin (JARDIANCE) 25 MG TABS tablet, Take 1 tablet (25 mg total) by mouth daily before breakfast., Disp: 90 tablet, Rfl: 2   glucose blood test strip, True metrix strips to test 1-2 times daily, Disp: 100 each, Rfl: 12   Lancets MISC, Use as instructed. Needs lancets based on pt's meter (true metrix), Disp: 100 each, Rfl: 12   losartan-hydrochlorothiazide (HYZAAR)  100-12.5 MG tablet, Take 1 tablet by mouth daily., Disp: 90 tablet, Rfl: 2   omeprazole (PRILOSEC) 40 MG capsule, Take 1 capsule (40 mg total) by mouth daily., Disp: 90 capsule, Rfl: 2   Semaglutide, 2 MG/DOSE, (OZEMPIC, 2 MG/DOSE,) 8 MG/3ML SOPN, Inject 2 mg into the skin once a week., Disp: 9 mL, Rfl: 1  Family History  Problem Relation Age of Onset   Stroke Mother    Hypertension Mother    Hypertension Father    Hypertension Sister    Hypertension Sister    Hypertension Sister    Hypertension Sister    Hypertension Sister    Hypertension Sister    Hypertension Sister    Hypertension Sister    Hypertension Sister    Hypertension Brother    Hypertension Brother    Hypertension Brother    Hypertension Brother    Stomach cancer Brother    Hypertension Brother    Hypertension Brother    Hypertension Brother    Hypertension Brother    Hypertension Brother    Hypertension Brother    Hypertension Brother    Hypertension Brother    Hypertension Brother    Hypertension Brother    Hypertension Brother    Hypertension Brother    Hypertension Brother    Hyperlipidemia Other    Hypertension Other    Sleep apnea Other    Obesity Other    Heart disease Neg Hx    Cancer Neg Hx    Diabetes Neg Hx    Colon cancer Neg Hx    Colon polyps Neg Hx    Esophageal cancer Neg Hx    Rectal cancer Neg Hx     Past Surgical History:  Procedure Laterality Date   CARPAL TUNNEL RELEASE     left   COLONOSCOPY  2010   normal per patient   COLONOSCOPY WITH PROPOFOL N/A 02/08/2021   Procedure: COLONOSCOPY WITH PROPOFOL;  Surgeon: Iva Boop, MD;  Location: [facility];  Service: Endoscopy;  Laterality: N/A;   HAND SURGERY     growth resection, right   POLYPECTOMY  02/08/2021   Procedure: POLYPECTOMY;  Surgeon: Iva Boop, MD;  Location: [facility];  Service: Endoscopy;;     Review of Systems  Constitutional:  Negative for chills, fever, malaise/fatigue and weight loss.   HENT:  Negative for congestion, ear pain, hearing loss, sore throat and tinnitus.   Eyes:  Negative for blurred vision, pain and redness.  Respiratory:  Negative for cough, hemoptysis and shortness of breath.   Cardiovascular:  Negative for chest pain, palpitations, orthopnea, claudication and leg swelling.  Gastrointestinal:  Negative for abdominal pain, blood in stool, constipation, diarrhea, nausea and vomiting.  Genitourinary:  Negative for dysuria, flank pain, frequency, hematuria and urgency.  Musculoskeletal:  Negative for falls, joint pain and  myalgias.  Skin:  Negative for itching and rash.  Neurological:  Negative for dizziness, tingling, speech change, weakness and headaches.  Endo/Heme/Allergies:  Negative for polydipsia. Does not bruise/bleed easily.  Psychiatric/Behavioral:  Negative for depression and memory loss. The patient is not nervous/anxious and does not have insomnia.          Objective:  BP 112/70   Pulse 84   Wt (!) 481 lb 3.2 oz (218.3 kg)   BMI 71.06 kg/m   General appearance: alert, no distress, WD/WN, African American male Skin: dry on arms and legs HEENT: normocephalic, conjunctiva/corneas normal, sclerae anicteric, PERRLA, EOMi, nares patent, no discharge or erythema, pharynx normal Oral cavity: MMM, tongue normal Neck: supple, no lymphadenopathy, no thyromegaly, no masses, normal ROM, no bruits Chest: non tender, normal shape and expansion Heart: RRR, normal S1, S2, no murmurs Lungs: CTA bilaterally, no wheezes, rhonchi, or rales Abdomen: +bs, soft, non tender, non distended, no masses, no hepatomegaly, no splenomegaly, no bruits Back: non tender, normal ROM, no scoliosis Musculoskeletal: upper extremities non tender, no obvious deformity, normal ROM throughout, lower extremities non tender, no obvious deformity, normal ROM throughout Extremities: no edema, no cyanosis, no clubbing Pulses: 2+ symmetric, upper and lower extremities, normal cap  refill Neurological: alert, oriented x 3, CN2-12 intact, strength normal upper extremities and lower extremities, sensation normal throughout, DTRs 2+ throughout, no cerebellar signs, gait normal Psychiatric: normal affect, behavior normal, pleasant  GU/rectal - deferred  Diabetic Foot Exam - Simple   Simple Foot Form Diabetic Foot exam was performed with the following findings: Yes 12/23/2022  8:57 AM  Visual Inspection See comments: Yes Sensation Testing Intact to touch and monofilament testing bilaterally: Yes Pulse Check See comments: Yes Comments 1+ pedal pulses, flat feet, otherwise unremarkable        Assessment and Plan :   Encounter Diagnoses  Name Primary?   Encounter for health maintenance examination in adult Yes   Essential hypertension, benign    Aortic atherosclerosis (HCC)    Type 2 diabetes mellitus with hyperglycemia, without long-term current use of insulin (HCC)    Mixed hyperlipidemia    Microalbuminuria    OSA (obstructive sleep apnea)    Vaccine counseling    Gastroesophageal reflux disease, unspecified whether esophagitis present    Screening for prostate cancer     This visit was a preventative care visit, also known as wellness visit or routine physical.   Topics typically include healthy lifestyle, diet, exercise, preventative care, vaccinations, sick and well care, proper use of emergency dept and after hours care, as well as other concerns.     Separate significant issues discussed: Diabetes-updated labs today.  Continue Jardiance 25 mg daily.  I advise he let us know if the pharmacy is having trouble keeping Ozempic in stock.  Continue Ozempic.  Refilled prescription for lancets and strips today so he can check his blood sugar more regularly.    Hypertension-blood pressure at goal, continue losartan HCT 100/12.5 mg daily, carvedilol 12.5 mg twice daily  Hyperlipidemia-continue atorvastatin 80 mg daily and aspirin 81 mg daily, routine labs  today  GERD-he continues on omeprazole.  I recommend losing weight  OSA-wife notes no recent problems with breathing.  Not currently on CPAP  Obesity-I recommend activity and exercise several days per week.  I recommend very strict diet and efforts to lose weight.  Lets be more consistent with Ozempic to help lose weight.  Consider bariatric surgery.  Consider working with a Psychologist, educational or specific  diet plan such as Atkins or whole 30 or some other plan to lose weight  General Recommendations: Continue to return yearly for your annual wellness and preventative care visits.  This gives [place] a chance to discuss healthy lifestyle, exercise, vaccinations, review your chart record, and perform screenings where appropriate.  I recommend you see your eye doctor yearly for routine vision care.  I recommend you see your dentist yearly for routine dental care including hygiene visits twice yearly.   Vaccination  Immunization History  Administered Date(s) Administered   Tdap 10/05/2018    Vaccine recommendations: Shingrix, flu, but he declines vaccines   Screening for cancer: Colon cancer screening: 01/2021 colonoscopy reviewed, repeat in 10 years  Prostate Cancer screening: The recommended prostate cancer screening test is a blood test called the prostate-specific antigen (PSA) test. PSA is a protein that is made in the prostate. As you age, your prostate naturally produces more PSA. Abnormally high PSA levels may be caused by: Prostate cancer. An enlarged prostate that is not caused by cancer (benign prostatic hyperplasia, or BPH). This condition is very common in older men. A prostate gland infection (prostatitis) or urinary tract infection. Certain medicines such as male hormones (like testosterone) or other medicines that raise testosterone levels. A rectal exam may be done as part of prostate cancer screening to help provide information about the size of your prostate gland. When a rectal  exam is performed, it should be done after the PSA level is drawn to avoid any effect on the results.   Skin cancer screening: Check your skin regularly for new changes, growing lesions, or other lesions of concern Come in for evaluation if you have skin lesions of concern.   Lung cancer screening: If you have a greater than 20 pack year history of tobacco use, then you may qualify for lung cancer screening with a chest CT scan.   Please call your insurance company to inquire about coverage for this test.   Pancreatic cancer:  no current screening test is available or routinely recommended. (risk factors: smoking, overweight or obese, diabetes, chronic pancreatitis, work exposure - dry cleaning, metal working, 52yo>, M>F, Tree surgeon, family hx/o, hereditary breast, ovarian, melanoma, lynch, peutz-jeghers).  Symptoms: jaundice, dark urine, light color or greasy stools, itchy skin, belly or back pain, weight loss, poor appetite, nausea, vomiting, liver enlargement, DVT/blood clots.   We currently don't have screenings for other cancers besides breast, cervical, colon, and lung cancers.  If you have a strong family history of cancer or have other cancer screening concerns, please let me know.  Genetic testing referral is an option for individuals with high cancer risk in the family.  There are some other cancer screenings in development currently.   Bone health: Get at least 150 minutes of aerobic exercise weekly Get weight bearing exercise at least once weekly Bone density test:  A bone density test is an imaging test that uses a type of X-ray to measure the amount of calcium and other minerals in your bones. The test may be used to diagnose or screen you for a condition that causes weak or thin bones (osteoporosis), predict your risk for a broken bone (fracture), or determine how well your osteoporosis treatment is working. The bone density test is recommended for females 65 and older,  or females or males <65 if certain risk factors such as thyroid disease, long term use of steroids such as for asthma or rheumatological issues, vitamin D deficiency, estrogen deficiency, family  history of osteoporosis, self or family history of fragility fracture in first degree relative.    Heart health: Get at least 150 minutes of aerobic exercise weekly Limit alcohol It is important to maintain a healthy blood pressure and healthy cholesterol numbers  Heart disease screening: Screening for heart disease includes screening for blood pressure, fasting lipids, glucose/diabetes screening, BMI height to weight ratio, reviewed of smoking status, physical activity, and diet.    Goals include blood pressure 120/80 or less, maintaining a healthy lipid/cholesterol profile, preventing diabetes or keeping diabetes numbers under good control, not smoking or using tobacco products, exercising most days per week or at least 150 minutes per week of exercise, and eating healthy variety of fruits and vegetables, healthy oils, and avoiding unhealthy food choices like fried food, fast food, high sugar and high cholesterol foods.    Echocardiogram 06/13/2019 shows LVEF 55 to 60%, normal function, no wall motion abnormalities there is some mild aortic valve sclerosis   Vascular disease screening: For higher risk individuals including smokers, diabetics, patients with known heart disease or high blood pressure, kidney disease, and others, screening for vascular disease or atherosclerosis of the arteries is available.  Examples may include carotid ultrasound, abdominal aortic ultrasound, ABI blood flow screening in the legs, thoracic aorta screening.  I recommend and ABI blood flow screen in legs.   This can be ordered through vascular office.  Let me know if agreeable.    Medical care options: I recommend you continue to seek care here first for routine care.  We try really hard to have available appointments  Monday through Friday daytime hours for sick visits, acute visits, and physicals.  Urgent care should be used for after hours and weekends for significant issues that cannot wait till the next day.  The emergency department should be used for significant potentially life-threatening emergencies.  The emergency department is expensive, can often have long wait times for less significant concerns, so try to utilize primary care, urgent care, or telemedicine when possible to avoid unnecessary trips to the emergency department.  Virtual visits and telemedicine have been introduced since the pandemic started in 2020, and can be convenient ways to receive medical care.  We offer virtual appointments as well to assist you in a variety of options to seek medical care.   Legal  Take the time to do a last will and testament, Advanced Directives including Health Care Power of Attorney and Living Will documents.  Don't leave your family with burdens that can be handled ahead of time.   Advanced Directives: I recommend you consider completing a Health Care Power of Attorney and Living Will.   These documents respect your wishes and help alleviate burdens on your loved ones if you were to become terminally ill or be in a position to need those documents enforced.    You can complete Advanced Directives yourself, have them notarized, then have copies made for our office, for you and for anybody you feel should have them in safe keeping.  Or, you can have an attorney prepare these documents.   If you haven't updated your Last Will and Testament in a while, it may be worthwhile having an attorney prepare these documents together and save on some costs.       Spiritual and Emotional Health Keeping a healthy spiritual life can help you better manage your physical health. Your spiritual life can help you to cope with any issues that may arise with your physical health.  Balance can keep [place] healthy and help [place] to  recover.  If you are struggling with your spiritual health there are questions that you may want to ask yourself:  What makes me feel most complete? When do I feel most connected to the rest of the world? Where do I find the most inner strength? What am I doing when I feel whole?  Helpful tips: Being in nature. Some people feel very connected and at peace when they are walking outdoors or are outside. Helping others. Some feel the largest sense of wellbeing when they are of service to others. Being of service can take on many forms. It can be doing volunteer work, being kind to strangers, or offering a hand to a friend in need. Gratitude. Some people find they feel the most connected when they remain grateful. They may make lists of all the things they are grateful for or say a thank you out loud for all they have.    Emotional Health Are you in tune with your emotional health?  Check out this link: http://www.marquez-love.com/    Financial Health Make sure you use a budget for your personal finances Make sure you are insured against risks (health insurance, life insurance, auto insurance, etc) Save more, spend less Set financial goals If you need help in this area, good resources include counseling through [employer] or other community resources, have a meeting with a Social research officer, government, and a good resource is the [employer] "Christopher Skinner" was seen today for medical management of chronic issues.  Diagnoses and all orders for this visit:  Encounter for health maintenance examination in adult -     Comprehensive metabolic panel -     CBC -     PSA -     Hemoglobin A1c -     Lipid panel -     Microalbumin/Creatinine Ratio, Urine -     Hepatitis C antibody  Essential hypertension, benign -     Comprehensive metabolic panel -     carvedilol (COREG) 12.5 MG tablet; Take 1 tablet (12.5 mg total) by mouth 2 (two) times daily with a meal. -      losartan-hydrochlorothiazide (HYZAAR) 100-12.5 MG tablet; Take 1 tablet by mouth daily.  Aortic atherosclerosis (HCC)  Type 2 diabetes mellitus with hyperglycemia, without long-term current use of insulin (HCC) -     Hemoglobin A1c -     Microalbumin/Creatinine Ratio, Urine  Mixed hyperlipidemia -     Lipid panel  Microalbuminuria  OSA (obstructive sleep apnea)  Vaccine counseling  Gastroesophageal reflux disease, unspecified whether esophagitis present  Screening for prostate cancer -     PSA  Other orders -     atorvastatin (LIPITOR) 80 MG tablet; Take 1 tablet (80 mg total) by mouth daily. -     aspirin EC 81 MG tablet; Take 1 tablet (81 mg total) by mouth daily. Swallow whole. -     empagliflozin (JARDIANCE) 25 MG TABS tablet; Take 1 tablet (25 mg total) by mouth daily before breakfast. -     cyanocobalamin (VITAMIN B12) 1000 MCG tablet; Take 1 tablet (1,000 mcg total) by mouth daily. -     omeprazole (PRILOSEC) 40 MG capsule; Take 1 capsule (40 mg total) by mouth daily. -     Semaglutide, 2 MG/DOSE, (OZEMPIC, 2 MG/DOSE,) 8 MG/3ML SOPN; Inject 2 mg into the skin once a week.  Follow-up pending labs, yearly for physical

## 2022-12-24 ENCOUNTER — Other Ambulatory Visit: Payer: Self-pay | Admitting: Medical

## 2022-12-24 LAB — CBC
Hematocrit: 40.6 % (ref 37.5–51.0)
Hemoglobin: 13.2 g/dL (ref 13.0–17.7)
MCH: 28.2 pg (ref 26.6–33.0)
MCHC: 32.5 g/dL (ref 31.5–35.7)
MCV: 87 fL (ref 79–97)
Platelets: 321 10*3/uL (ref 150–450)
RBC: 4.68 x10E6/uL (ref 4.14–5.80)
RDW: 14.5 % (ref 11.6–15.4)
WBC: 6.6 10*3/uL (ref 3.4–10.8)

## 2022-12-24 LAB — COMPREHENSIVE METABOLIC PANEL
ALT: 29 IU/L (ref 0–44)
AST: 22 IU/L (ref 0–40)
Albumin: 3.9 g/dL (ref 3.8–4.9)
Alkaline Phosphatase: 100 IU/L (ref 44–121)
BUN/Creatinine Ratio: 12 (ref 9–20)
BUN: 16 mg/dL (ref 6–24)
Bilirubin Total: 0.4 mg/dL (ref 0.0–1.2)
CO2: 22 mmol/L (ref 20–29)
Calcium: 9.9 mg/dL (ref 8.7–10.2)
Chloride: 101 mmol/L (ref 96–106)
Creatinine, Ser: 1.35 mg/dL — ABNORMAL HIGH (ref 0.76–1.27)
Globulin, Total: 3.6 g/dL (ref 1.5–4.5)
Glucose: 142 mg/dL — ABNORMAL HIGH (ref 70–99)
Potassium: 4.5 mmol/L (ref 3.5–5.2)
Sodium: 137 mmol/L (ref 134–144)
Total Protein: 7.5 g/dL (ref 6.0–8.5)
eGFR: 63 mL/min/{1.73_m2} (ref >59)

## 2022-12-24 LAB — LIPID PANEL
Chol/HDL Ratio: 3.6 ratio (ref 0.0–5.0)
Cholesterol, Total: 145 mg/dL (ref 100–199)
HDL: 40 mg/dL (ref >39)
LDL Chol Calc (NIH): 87 mg/dL (ref 0–99)
Triglycerides: 95 mg/dL (ref 0–149)
VLDL Cholesterol Cal: 18 mg/dL (ref 5–40)

## 2022-12-24 LAB — HEMOGLOBIN A1C
Est. average glucose Bld gHb Est-mCnc: 166 mg/dL
Hgb A1c MFr Bld: 7.4 % — ABNORMAL HIGH (ref 4.8–5.6)

## 2022-12-24 LAB — PSA: Prostate Specific Ag, Serum: <0.1 ng/mL (ref 0.0–4.0)

## 2022-12-24 LAB — HEPATITIS C ANTIBODY: Hep C Virus Ab: NONREACTIVE

## 2022-12-24 LAB — MICROALBUMIN / CREATININE URINE RATIO
Creatinine, Urine: 119.4 mg/dL
Microalb/Creat Ratio: 18 mg/g{creat} (ref 0–29)
Microalbumin, Urine: 21.1 ug/mL

## 2022-12-24 MED ORDER — ALBUTEROL SULFATE HFA 108 (90 BASE) MCG/ACT IN AERS: 2.0000 | INHALATION_SPRAY | RESPIRATORY_TRACT | 0 refills | Status: AC | PRN

## 2022-12-24 NOTE — Progress Notes (Signed)
Results sent through MyChart

## 2022-12-26 LAB — HM DIABETES EYE EXAM

## 2022-12-27 NOTE — Progress Notes (Signed)
 Please call as they have not reviewed my chart results

## 2023-01-07 ENCOUNTER — Telehealth: Payer: Self-pay | Admitting: Medical

## 2023-01-07 DIAGNOSIS — H9313 Tinnitus, bilateral: Secondary | ICD-10-CM

## 2023-01-07 DIAGNOSIS — H938X3 Other specified disorders of ear, bilateral: Secondary | ICD-10-CM

## 2023-01-07 NOTE — Telephone Encounter (Signed)
Pt wife called and says the ear drops aren't working for his ear ringing and it has worsened. Is there anything else he can try?

## 2023-01-07 NOTE — Telephone Encounter (Signed)
Tried to call pt but vm is full. Went ahead and put in a referral to ENT as urgent.

## 2023-01-13 ENCOUNTER — Other Ambulatory Visit: Payer: Self-pay | Admitting: Internal Medicine

## 2023-01-13 DIAGNOSIS — H938X3 Other specified disorders of ear, bilateral: Secondary | ICD-10-CM

## 2023-01-13 DIAGNOSIS — H9313 Tinnitus, bilateral: Secondary | ICD-10-CM

## 2023-01-23 ENCOUNTER — Ambulatory Visit: Payer: 59 | Attending: Medical | Admitting: Audiologist

## 2023-02-27 ENCOUNTER — Ambulatory Visit: Payer: 59 | Attending: Medical | Admitting: Audiologist

## 2023-03-24 ENCOUNTER — Encounter: Payer: 59 | Admitting: Medical

## 2023-04-17 ENCOUNTER — Other Ambulatory Visit (HOSPITAL_COMMUNITY): Payer: Self-pay

## 2023-04-17 ENCOUNTER — Telehealth: Payer: Self-pay

## 2023-04-17 NOTE — Telephone Encounter (Signed)
Pharmacy Patient Advocate Encounter   Received notification from CoverMyMeds that prior authorization for Ozempic is required/requested.   Insurance verification completed.   The patient is insured through [facility] .   Per test claim: PA required; PA submitted to above mentioned insurance via CoverMyMeds Key/confirmation #/EOC Key: [id number]      Status is pending

## 2023-04-21 ENCOUNTER — Other Ambulatory Visit (HOSPITAL_COMMUNITY): Payer: Self-pay

## 2023-04-21 NOTE — Telephone Encounter (Signed)
Pharmacy Patient Advocate Encounter  Received notification from [facility] that Prior Authorization for {Ozempic has been APPROVED from 12.20.24 to 12.20.25. Ran test claim, Copay is $RTS, Rx is fillable again on/after 1.21.25. This test claim was processed through [facility]- copay amounts may vary at other pharmacies due to pharmacy/plan contracts, or as the patient moves through the different stages of their insurance plan.   PA #/Case ID/Reference #: Key: [id number])

## 2023-04-28 ENCOUNTER — Encounter: Payer: Self-pay | Admitting: Internal Medicine

## 2023-05-02 ENCOUNTER — Other Ambulatory Visit (HOSPITAL_COMMUNITY): Payer: Self-pay

## 2023-05-02 ENCOUNTER — Telehealth: Payer: Self-pay

## 2023-05-02 NOTE — Telephone Encounter (Signed)
 Pharmacy Patient Advocate Encounter   Received notification from CoverMyMeds that prior authorization for JARDIANCE  is required/requested.   Insurance verification completed.   The patient is insured through [facility] .   Per test claim: PA required; PA submitted to above mentioned insurance via CoverMyMeds Key/confirmation #/EOC (Key: [id number])  Status is pending

## 2023-05-05 ENCOUNTER — Other Ambulatory Visit (HOSPITAL_COMMUNITY): Payer: Self-pay

## 2023-05-05 ENCOUNTER — Telehealth: Payer: Self-pay

## 2023-05-05 NOTE — Telephone Encounter (Signed)
 Pharmacy Patient Advocate Encounter   Received notification from CoverMyMeds/PTS PHARMACY that prior authorization for {OZEMPICis required/requested.   Insurance verification completed.   The patient is insured through  [employer]  .   Per test claim: PA required; PA submitted to above mentioned insurance via CoverMyMeds Key/confirmation #/EOC (Key: [id number])     Status is pending

## 2023-05-05 NOTE — Telephone Encounter (Signed)
 Pharmacy Patient Advocate Encounter  Received notification from [employer] that Prior Authorization for Jardiance  has been APPROVED from 1.3.25 to 1.3.26.   This test claim was processed through [facility]- copay amounts may vary at other pharmacies due to pharmacy/plan contracts, or as the patient moves through the different stages of their insurance plan.

## 2023-05-06 ENCOUNTER — Other Ambulatory Visit (HOSPITAL_COMMUNITY): Payer: Self-pay

## 2023-05-06 NOTE — Telephone Encounter (Signed)
 Pharmacy Patient Advocate Encounter  Received notification from  [facility]  that Prior Authorization for Ozempic  has been APPROVED from 1.6.25 to 1.6.26. Ran test claim, Copay is $4. This test claim was processed through [facility]- copay amounts may vary at other pharmacies due to pharmacy/plan contracts, or as the patient moves through the different stages of their insurance plan.   I have spoke with the patients pharmacy and the patient has recently picked up a 90day on 11/15.

## 2023-06-08 ENCOUNTER — Other Ambulatory Visit: Payer: Self-pay | Admitting: Medical

## 2023-07-24 ENCOUNTER — Ambulatory Visit: Payer: 59 | Admitting: Medical

## 2023-07-24 VITALS — BP 124/80 | HR 83 | Ht 69.0 in | Wt >= 6400 oz

## 2023-07-24 DIAGNOSIS — D649 Anemia, unspecified: Secondary | ICD-10-CM

## 2023-07-24 DIAGNOSIS — R809 Proteinuria, unspecified: Secondary | ICD-10-CM | POA: Diagnosis not present

## 2023-07-24 DIAGNOSIS — Z1211 Encounter for screening for malignant neoplasm of colon: Secondary | ICD-10-CM | POA: Diagnosis not present

## 2023-07-24 DIAGNOSIS — E1165 Type 2 diabetes mellitus with hyperglycemia: Secondary | ICD-10-CM | POA: Diagnosis not present

## 2023-07-24 DIAGNOSIS — I700 Atherosclerosis of aorta: Secondary | ICD-10-CM | POA: Diagnosis not present

## 2023-07-24 DIAGNOSIS — Z125 Encounter for screening for malignant neoplasm of prostate: Secondary | ICD-10-CM | POA: Diagnosis not present

## 2023-07-24 DIAGNOSIS — Z0000 Encounter for general adult medical examination without abnormal findings: Secondary | ICD-10-CM | POA: Diagnosis not present

## 2023-07-24 DIAGNOSIS — Z2820 Immunization not carried out because of patient decision for unspecified reason: Secondary | ICD-10-CM | POA: Diagnosis not present

## 2023-07-24 DIAGNOSIS — E782 Mixed hyperlipidemia: Secondary | ICD-10-CM | POA: Diagnosis not present

## 2023-07-24 DIAGNOSIS — I10 Essential (primary) hypertension: Secondary | ICD-10-CM

## 2023-07-24 DIAGNOSIS — K219 Gastro-esophageal reflux disease without esophagitis: Secondary | ICD-10-CM | POA: Diagnosis not present

## 2023-07-24 LAB — MICROSCOPIC EXAMINATION

## 2023-07-24 LAB — LIPID PANEL

## 2023-07-24 NOTE — Patient Instructions (Signed)
 This visit was a preventative care visit, also known as wellness visit or routine physical.   Topics typically include healthy lifestyle, diet, exercise, preventative care, vaccinations, sick and well care, proper use of emergency dept and after hours care, as well as other concerns.     Separate significant issues discussed: Diabetes-updated labs today.  Continue Jardiance 25 mg daily.  Change to [facility] to see if this would work better overall.    Hypertension-blood pressure at goal, continue losartan HCT 100/12.5 mg daily, carvedilol 12.5 mg twice daily  Hyperlipidemia-continue atorvastatin 80 mg daily and aspirin 81 mg daily, routine labs today  GERD-he continues on omeprazole.  I recommend losing weight  OSA- declines CPAP  Obesity-despite Jardiance and ozempic which should help with weight loss, you have only been able to lose about 10 lbs in past year.  I strongly recommend you follow up with bariatric clinic and cardiology to pursue weight loss surgery!  I will change to ozempic to mounjaro to see if this would work better.  Dental plaque - see dentist soon, brush twice daily, floss daily   General Recommendations: Continue to return yearly for your annual wellness and preventative care visits.  This gives [place] a chance to discuss healthy lifestyle, exercise, vaccinations, review your chart record, and perform screenings where appropriate.  I recommend you see your eye doctor yearly for routine vision care.  I recommend you see your dentist yearly for routine dental care including hygiene visits twice yearly.   Vaccination  Immunization History  Administered Date(s) Administered   Tdap 10/05/2018    Vaccine recommendations: Shingrix, pneumococcal vaccine, flu, but he declines vaccines   Screening for cancer: Colon cancer screening: 01/2021 colonoscopy reviewed, repeat in 10 years Will send home with FIT testing  Prostate Cancer screening: The recommended prostate  cancer screening test is a blood test called the prostate-specific antigen (PSA) test. PSA is a protein that is made in the prostate. As you age, your prostate naturally produces more PSA. Abnormally high PSA levels may be caused by: Prostate cancer. An enlarged prostate that is not caused by cancer (benign prostatic hyperplasia, or BPH). This condition is very common in older men. A prostate gland infection (prostatitis) or urinary tract infection. Certain medicines such as male hormones (like testosterone) or other medicines that raise testosterone levels. A rectal exam may be done as part of prostate cancer screening to help provide information about the size of your prostate gland. When a rectal exam is performed, it should be done after the PSA level is drawn to avoid any effect on the results.   Skin cancer screening: Check your skin regularly for new changes, growing lesions, or other lesions of concern Come in for evaluation if you have skin lesions of concern.   Lung cancer screening: If you have a greater than 20 pack year history of tobacco use, then you may qualify for lung cancer screening with a chest CT scan.   Please call your insurance company to inquire about coverage for this test.   Pancreatic cancer:  no current screening test is available or routinely recommended. (risk factors: smoking, overweight or obese, diabetes, chronic pancreatitis, work exposure - dry cleaning, metal working, 53yo>, M>F, Tree surgeon, family hx/o, hereditary breast, ovarian, melanoma, lynch, peutz-jeghers).  Symptoms: jaundice, dark urine, light color or greasy stools, itchy skin, belly or back pain, weight loss, poor appetite, nausea, vomiting, liver enlargement, DVT/blood clots.   We currently don't have screenings for other cancers besides breast,  cervical, colon, and lung cancers.  If you have a strong family history of cancer or have other cancer screening concerns, please let me know.   Genetic testing referral is an option for individuals with high cancer risk in the family.  There are some other cancer screenings in development currently.   Bone health: Get at least 150 minutes of aerobic exercise weekly Get weight bearing exercise at least once weekly Bone density test:  A bone density test is an imaging test that uses a type of X-ray to measure the amount of calcium and other minerals in your bones. The test may be used to diagnose or screen you for a condition that causes weak or thin bones (osteoporosis), predict your risk for a broken bone (fracture), or determine how well your osteoporosis treatment is working. The bone density test is recommended for females 65 and older, or females or males <65 if certain risk factors such as thyroid disease, long term use of steroids such as for asthma or rheumatological issues, vitamin D deficiency, estrogen deficiency, family history of osteoporosis, self or family history of fragility fracture in first degree relative.    Heart health: Get at least 150 minutes of aerobic exercise weekly Limit alcohol It is important to maintain a healthy blood pressure and healthy cholesterol numbers  Heart disease screening: Screening for heart disease includes screening for blood pressure, fasting lipids, glucose/diabetes screening, BMI height to weight ratio, reviewed of smoking status, physical activity, and diet.    Goals include blood pressure 120/80 or less, maintaining a healthy lipid/cholesterol profile, preventing diabetes or keeping diabetes numbers under good control, not smoking or using tobacco products, exercising most days per week or at least 150 minutes per week of exercise, and eating healthy variety of fruits and vegetables, healthy oils, and avoiding unhealthy food choices like fried food, fast food, high sugar and high cholesterol foods.    Echocardiogram 06/13/2019 shows LVEF 55 to 60%, normal function, no wall motion  abnormalities there is some mild aortic valve sclerosis   Vascular disease screening: For higher risk individuals including smokers, diabetics, patients with known heart disease or high blood pressure, kidney disease, and others, screening for vascular disease or atherosclerosis of the arteries is available.  Examples may include carotid ultrasound, abdominal aortic ultrasound, ABI blood flow screening in the legs, thoracic aorta screening.  I recommend and ABI blood flow screen in legs.   This can be ordered through vascular office.  Let me know if agreeable.    Medical care options: I recommend you continue to seek care here first for routine care.  We try really hard to have available appointments Monday through Friday daytime hours for sick visits, acute visits, and physicals.  Urgent care should be used for after hours and weekends for significant issues that cannot wait till the next day.  The emergency department should be used for significant potentially life-threatening emergencies.  The emergency department is expensive, can often have long wait times for less significant concerns, so try to utilize primary care, urgent care, or telemedicine when possible to avoid unnecessary trips to the emergency department.  Virtual visits and telemedicine have been introduced since the pandemic started in 2020, and can be convenient ways to receive medical care.  We offer virtual appointments as well to assist you in a variety of options to seek medical care.   Legal  Take the time to do a last will and testament, Advanced Directives including Health Care Power of  Attorney and Living Will documents.  Don't leave your family with burdens that can be handled ahead of time.   Advanced Directives: I recommend you consider completing a Health Care Power of Attorney and Living Will.   These documents respect your wishes and help alleviate burdens on your loved ones if you were to become terminally ill or be  in a position to need those documents enforced.    You can complete Advanced Directives yourself, have them notarized, then have copies made for our office, for you and for anybody you feel should have them in safe keeping.  Or, you can have an attorney prepare these documents.   If you haven't updated your Last Will and Testament in a while, it may be worthwhile having an attorney prepare these documents together and save on some costs.       Spiritual and Emotional Health Keeping a healthy spiritual life can help you better manage your physical health. Your spiritual life can help you to cope with any issues that may arise with your physical health.  Balance can keep [place] healthy and help [place] to recover.  If you are struggling with your spiritual health there are questions that you may want to ask yourself:  What makes me feel most complete? When do I feel most connected to the rest of the world? Where do I find the most inner strength? What am I doing when I feel whole?  Helpful tips: Being in nature. Some people feel very connected and at peace when they are walking outdoors or are outside. Helping others. Some feel the largest sense of wellbeing when they are of service to others. Being of service can take on many forms. It can be doing volunteer work, being kind to strangers, or offering a hand to a friend in need. Gratitude. Some people find they feel the most connected when they remain grateful. They may make lists of all the things they are grateful for or say a thank you out loud for all they have.    Emotional Health Are you in tune with your emotional health?  Check out this link: http://www.marquez-love.com/    Financial Health Make sure you use a budget for your personal finances Make sure you are insured against risks (health insurance, life insurance, auto insurance, etc) Save more, spend less Set financial goals If you need help in this area, good resources  include counseling through [employer] or other community resources, have a meeting with a Social research officer, government, and a good resource is the [employer]

## 2023-07-24 NOTE — Progress Notes (Signed)
 Subjective:   HPI  Christopher Skinner is a 53 y.o. male who presents for Chief Complaint  Patient presents with   Annual Exam    Fasting cpe, no concerns. Declines pneumonia shot    Patient Care Team: Camron Essman, Kermit Balo, PA-C as PCP - General (Family Medicine) Lyn Records, MD (Inactive) as PCP - Cardiology (Cardiology) Dr. Levert Feinstein, neurology [facility] surgery Dr. Clementeen Graham, sports medicine Dr. Stan Head, GI   Concerns: Hyperlipidemia -  compliant with aspirin 81mg  daily and Atorvastatin Lipitor 80 mg daily  HTN - compliant with Carvedilol 12.5 mg twice daily, losartan HCT 100/12.5 mg daily  Diabetes - compliant with Jardiance 25 mg daily, uses Ozempic 2 mg weekly,   Using some Hibiscus tea and feels better, less GERD, thinks its helping with weight loss.  Not using CPAP  No alcohol use.    Exercise - free weights every other day at home, walking some   Reviewed their medical, surgical, family, social, medication, and allergy history and updated chart as appropriate.  Allergies  Allergen Reactions   Trulicity [Dulaglutide]     Rash.  Tolerates Ozempic well.   Metformin And Related     Bad loose stools even at low doses    Shellfish Allergy Nausea And Vomiting    Past Medical History:  Diagnosis Date   COVID-19 05/02/2019   Diabetes mellitus without complication (HCC) 2019   GERD (gastroesophageal reflux disease)    H/O exercise stress test 2009   reportedly normal per patient   Hyperlipidemia    Hypertension 2008   Microalbuminuria 2019   Obesity    Sleep apnea    last used CPAP 2014; quit on his own    Current Outpatient Medications on File Prior to Visit  Medication Sig Dispense Refill   aspirin EC 81 MG tablet Take 1 tablet (81 mg total) by mouth daily. Swallow whole. 90 tablet 2   atorvastatin (LIPITOR) 80 MG tablet Take 1 tablet (80 mg total) by mouth daily. 90 tablet 2   carvedilol (COREG) 12.5 MG tablet Take 1 tablet (12.5 mg  total) by mouth 2 (two) times daily with a meal. 180 tablet 2   cyanocobalamin (VITAMIN B12) 1000 MCG tablet Take 1 tablet (1,000 mcg total) by mouth daily. 90 tablet 2   empagliflozin (JARDIANCE) 25 MG TABS tablet Take 1 tablet (25 mg total) by mouth daily before breakfast. 90 tablet 2   losartan-hydrochlorothiazide (HYZAAR) 100-12.5 MG tablet Take 1 tablet by mouth daily. 90 tablet 2   omeprazole (PRILOSEC) 40 MG capsule Take 1 capsule (40 mg total) by mouth daily. 90 capsule 2   OZEMPIC, 2 MG/DOSE, 8 MG/3ML SOPN INJECT 2 MG SUBCUTANEOUSLY ONCE A WEEK 9 mL 0   albuterol (VENTOLIN HFA) 108 (90 Base) MCG/ACT inhaler Inhale 2 puffs into the lungs every 4 (four) hours as needed for wheezing or shortness of breath. (Patient not taking: Reported on 07/24/2023) 1 each 0   blood glucose meter kit and supplies KIT Dispense based on patient and insurance preference. Use 1-2 times daily as directed. (FOR ICD-10 DE11.69). 1 each 0   Blood Glucose Monitoring Suppl (TRUE METRIX METER) DEVI 1 Device by Does not apply route daily. 1 each 0   glucose blood test strip True metrix strips to test 1-2 times daily 100 each 12   Lancets MISC Use as instructed. Needs lancets based on pt's meter (true metrix) 100 each 12   No current facility-administered medications on file  prior to visit.      Current Outpatient Medications:    aspirin EC 81 MG tablet, Take 1 tablet (81 mg total) by mouth daily. Swallow whole., Disp: 90 tablet, Rfl: 2   atorvastatin (LIPITOR) 80 MG tablet, Take 1 tablet (80 mg total) by mouth daily., Disp: 90 tablet, Rfl: 2   carvedilol (COREG) 12.5 MG tablet, Take 1 tablet (12.5 mg total) by mouth 2 (two) times daily with a meal., Disp: 180 tablet, Rfl: 2   cyanocobalamin (VITAMIN B12) 1000 MCG tablet, Take 1 tablet (1,000 mcg total) by mouth daily., Disp: 90 tablet, Rfl: 2   empagliflozin (JARDIANCE) 25 MG TABS tablet, Take 1 tablet (25 mg total) by mouth daily before breakfast., Disp: 90 tablet,  Rfl: 2   losartan-hydrochlorothiazide (HYZAAR) 100-12.5 MG tablet, Take 1 tablet by mouth daily., Disp: 90 tablet, Rfl: 2   omeprazole (PRILOSEC) 40 MG capsule, Take 1 capsule (40 mg total) by mouth daily., Disp: 90 capsule, Rfl: 2   OZEMPIC, 2 MG/DOSE, 8 MG/3ML SOPN, INJECT 2 MG SUBCUTANEOUSLY ONCE A WEEK, Disp: 9 mL, Rfl: 0   albuterol (VENTOLIN HFA) 108 (90 Base) MCG/ACT inhaler, Inhale 2 puffs into the lungs every 4 (four) hours as needed for wheezing or shortness of breath. (Patient not taking: Reported on 07/24/2023), Disp: 1 each, Rfl: 0   blood glucose meter kit and supplies KIT, Dispense based on patient and insurance preference. Use 1-2 times daily as directed. (FOR ICD-10 DE11.69)., Disp: 1 each, Rfl: 0   Blood Glucose Monitoring Suppl (TRUE METRIX METER) DEVI, 1 Device by Does not apply route daily., Disp: 1 each, Rfl: 0   glucose blood test strip, True metrix strips to test 1-2 times daily, Disp: 100 each, Rfl: 12   Lancets MISC, Use as instructed. Needs lancets based on pt's meter (true metrix), Disp: 100 each, Rfl: 12  Family History  Problem Relation Age of Onset   Stroke Mother    Hypertension Mother    Hypertension Father    Hypertension Sister    Hypertension Sister    Hypertension Sister    Hypertension Sister    Hypertension Sister    Hypertension Sister    Hypertension Sister    Hypertension Sister    Hypertension Sister    Hypertension Brother    Hypertension Brother    Hypertension Brother    Hypertension Brother    Stomach cancer Brother    Hypertension Brother    Hypertension Brother    Hypertension Brother    Hypertension Brother    Hypertension Brother    Hypertension Brother    Hypertension Brother    Hypertension Brother    Hypertension Brother    Hypertension Brother    Hypertension Brother    Hypertension Brother    Hypertension Brother    Hyperlipidemia Other    Hypertension Other    Sleep apnea Other    Obesity Other    Heart disease Neg  Hx    Cancer Neg Hx    Diabetes Neg Hx    Colon cancer Neg Hx    Colon polyps Neg Hx    Esophageal cancer Neg Hx    Rectal cancer Neg Hx     Past Surgical History:  Procedure Laterality Date   CARPAL TUNNEL RELEASE     left   COLONOSCOPY  2010   normal per patient   COLONOSCOPY WITH PROPOFOL N/A 02/08/2021   Procedure: COLONOSCOPY WITH PROPOFOL;  Surgeon: Iva Boop, MD;  Location:  [facility];  Service: Endoscopy;  Laterality: N/A;   HAND SURGERY     growth resection, right   POLYPECTOMY  02/08/2021   Procedure: POLYPECTOMY;  Surgeon: Iva Boop, MD;  Location: [facility];  Service: Endoscopy;;     Review of Systems  Constitutional:  Negative for chills, fever, malaise/fatigue and weight loss.  HENT:  Negative for congestion, ear pain, hearing loss, sore throat and tinnitus.   Eyes:  Negative for blurred vision, pain and redness.  Respiratory:  Negative for cough, hemoptysis and shortness of breath.   Cardiovascular:  Negative for chest pain, palpitations, orthopnea, claudication and leg swelling.  Gastrointestinal:  Negative for abdominal pain, blood in stool, constipation, diarrhea, nausea and vomiting.  Genitourinary:  Negative for dysuria, flank pain, frequency, hematuria and urgency.  Musculoskeletal:  Negative for falls, joint pain and myalgias.  Skin:  Negative for itching and rash.  Neurological:  Negative for dizziness, tingling, speech change, weakness and headaches.  Endo/Heme/Allergies:  Negative for polydipsia. Does not bruise/bleed easily.  Psychiatric/Behavioral:  Negative for depression and memory loss. The patient is not nervous/anxious and does not have insomnia.       Objective:  BP 124/80   Pulse 83   Ht 5\' 9"  (1.753 m)   Wt (!) 477 lb 6.4 oz (216.5 kg)   BMI 70.50 kg/m   General appearance: alert, no distress, WD/WN, African American male Skin: dry on arms and legs HEENT: normocephalic, conjunctiva/corneas normal, sclerae anicteric,  PERRLA, EOMi, nares patent, no discharge or erythema, pharynx normal Oral cavity: MMM, tongue normal, moderate plaque throughout Neck: supple, no lymphadenopathy, no thyromegaly, no masses, normal ROM, no bruits Chest: non tender, normal shape and expansion Heart: RRR, normal S1, S2, no murmurs Lungs: CTA bilaterally, no wheezes, rhonchi, or rales Abdomen: +bs, soft, non tender, non distended, no masses, no hepatomegaly, no splenomegaly, no bruits Back: non tender, normal ROM, no scoliosis Musculoskeletal: upper extremities non tender, no obvious deformity, normal ROM throughout, lower extremities non tender, no obvious deformity, normal ROM throughout Extremities: no edema, no cyanosis, no clubbing Pulses: 2+ symmetric, upper and lower extremities, normal cap refill Neurological: alert, oriented x 3, CN2-12 intact, strength normal upper extremities and lower extremities, sensation normal throughout, DTRs 2+ throughout, no cerebellar signs, gait normal Psychiatric: normal affect, behavior normal, pleasant  GU/rectal - deferred     Assessment and Plan :   Encounter Diagnoses  Name Primary?   Encounter for health maintenance examination in adult Yes   Type 2 diabetes mellitus with hyperglycemia, without long-term current use of insulin (HCC)    Anemia, unspecified type    Aortic atherosclerosis (HCC)    Essential hypertension, benign    Gastroesophageal reflux disease, unspecified whether esophagitis present    Mixed hyperlipidemia    Microalbuminuria    Vaccine refused by patient    Screening for prostate cancer     This visit was a preventative care visit, also known as wellness visit or routine physical.   Topics typically include healthy lifestyle, diet, exercise, preventative care, vaccinations, sick and well care, proper use of emergency dept and after hours care, as well as other concerns.     Separate significant issues discussed: Diabetes-updated labs today.  Continue  Jardiance 25 mg daily.  Change to [facility] to see if this would work better overall.    Hypertension-blood pressure at goal, continue losartan HCT 100/12.5 mg daily, carvedilol 12.5 mg twice daily  Hyperlipidemia-continue atorvastatin 80 mg daily and aspirin 81 mg  daily, routine labs today  GERD-he continues on omeprazole.  I recommend losing weight  OSA- declines CPAP  Obesity-despite Jardiance and ozempic which should help with weight loss, you have only been able to lose about 10 lbs in past year.  I strongly recommend you follow up with bariatric clinic and cardiology to pursue weight loss surgery!  I will change to ozempic to mounjaro to see if this would work better.  Dental plaque - see dentist soon, brush twice daily, floss daily   General Recommendations: Continue to return yearly for your annual wellness and preventative care visits.  This gives [place] a chance to discuss healthy lifestyle, exercise, vaccinations, review your chart record, and perform screenings where appropriate.  I recommend you see your eye doctor yearly for routine vision care.  I recommend you see your dentist yearly for routine dental care including hygiene visits twice yearly.   Vaccination  Immunization History  Administered Date(s) Administered   Tdap 10/05/2018    Vaccine recommendations: Shingrix, pneumococcal vaccine, flu, but he declines vaccines   Screening for cancer: Colon cancer screening: 01/2021 colonoscopy reviewed, repeat in 10 years Will send home with FIT testing  Prostate Cancer screening: The recommended prostate cancer screening test is a blood test called the prostate-specific antigen (PSA) test. PSA is a protein that is made in the prostate. As you age, your prostate naturally produces more PSA. Abnormally high PSA levels may be caused by: Prostate cancer. An enlarged prostate that is not caused by cancer (benign prostatic hyperplasia, or BPH). This condition is very common  in older men. A prostate gland infection (prostatitis) or urinary tract infection. Certain medicines such as male hormones (like testosterone) or other medicines that raise testosterone levels. A rectal exam may be done as part of prostate cancer screening to help provide information about the size of your prostate gland. When a rectal exam is performed, it should be done after the PSA level is drawn to avoid any effect on the results.   Skin cancer screening: Check your skin regularly for new changes, growing lesions, or other lesions of concern Come in for evaluation if you have skin lesions of concern.   Lung cancer screening: If you have a greater than 20 pack year history of tobacco use, then you may qualify for lung cancer screening with a chest CT scan.   Please call your insurance company to inquire about coverage for this test.   Pancreatic cancer:  no current screening test is available or routinely recommended. (risk factors: smoking, overweight or obese, diabetes, chronic pancreatitis, work exposure - dry cleaning, metal working, 53yo>, M>F, Tree surgeon, family hx/o, hereditary breast, ovarian, melanoma, lynch, peutz-jeghers).  Symptoms: jaundice, dark urine, light color or greasy stools, itchy skin, belly or back pain, weight loss, poor appetite, nausea, vomiting, liver enlargement, DVT/blood clots.   We currently don't have screenings for other cancers besides breast, cervical, colon, and lung cancers.  If you have a strong family history of cancer or have other cancer screening concerns, please let me know.  Genetic testing referral is an option for individuals with high cancer risk in the family.  There are some other cancer screenings in development currently.   Bone health: Get at least 150 minutes of aerobic exercise weekly Get weight bearing exercise at least once weekly Bone density test:  A bone density test is an imaging test that uses a type of X-ray to measure  the amount of calcium and other minerals in your  bones. The test may be used to diagnose or screen you for a condition that causes weak or thin bones (osteoporosis), predict your risk for a broken bone (fracture), or determine how well your osteoporosis treatment is working. The bone density test is recommended for females 65 and older, or females or males <65 if certain risk factors such as thyroid disease, long term use of steroids such as for asthma or rheumatological issues, vitamin D deficiency, estrogen deficiency, family history of osteoporosis, self or family history of fragility fracture in first degree relative.    Heart health: Get at least 150 minutes of aerobic exercise weekly Limit alcohol It is important to maintain a healthy blood pressure and healthy cholesterol numbers  Heart disease screening: Screening for heart disease includes screening for blood pressure, fasting lipids, glucose/diabetes screening, BMI height to weight ratio, reviewed of smoking status, physical activity, and diet.    Goals include blood pressure 120/80 or less, maintaining a healthy lipid/cholesterol profile, preventing diabetes or keeping diabetes numbers under good control, not smoking or using tobacco products, exercising most days per week or at least 150 minutes per week of exercise, and eating healthy variety of fruits and vegetables, healthy oils, and avoiding unhealthy food choices like fried food, fast food, high sugar and high cholesterol foods.    Echocardiogram 06/13/2019 shows LVEF 55 to 60%, normal function, no wall motion abnormalities there is some mild aortic valve sclerosis   Vascular disease screening: For higher risk individuals including smokers, diabetics, patients with known heart disease or high blood pressure, kidney disease, and others, screening for vascular disease or atherosclerosis of the arteries is available.  Examples may include carotid ultrasound, abdominal aortic  ultrasound, ABI blood flow screening in the legs, thoracic aorta screening.  I recommend and ABI blood flow screen in legs.   This can be ordered through vascular office.  Let me know if agreeable.    Medical care options: I recommend you continue to seek care here first for routine care.  We try really hard to have available appointments Monday through Friday daytime hours for sick visits, acute visits, and physicals.  Urgent care should be used for after hours and weekends for significant issues that cannot wait till the next day.  The emergency department should be used for significant potentially life-threatening emergencies.  The emergency department is expensive, can often have long wait times for less significant concerns, so try to utilize primary care, urgent care, or telemedicine when possible to avoid unnecessary trips to the emergency department.  Virtual visits and telemedicine have been introduced since the pandemic started in 2020, and can be convenient ways to receive medical care.  We offer virtual appointments as well to assist you in a variety of options to seek medical care.   Legal  Take the time to do a last will and testament, Advanced Directives including Health Care Power of Attorney and Living Will documents.  Don't leave your family with burdens that can be handled ahead of time.   Advanced Directives: I recommend you consider completing a Health Care Power of Attorney and Living Will.   These documents respect your wishes and help alleviate burdens on your loved ones if you were to become terminally ill or be in a position to need those documents enforced.    You can complete Advanced Directives yourself, have them notarized, then have copies made for our office, for you and for anybody you feel should have them in safe keeping.  Or, you can have an attorney prepare these documents.   If you haven't updated your Last Will and Testament in a while, it may be worthwhile  having an attorney prepare these documents together and save on some costs.       Spiritual and Emotional Health Keeping a healthy spiritual life can help you better manage your physical health. Your spiritual life can help you to cope with any issues that may arise with your physical health.  Balance can keep [place] healthy and help [place] to recover.  If you are struggling with your spiritual health there are questions that you may want to ask yourself:  What makes me feel most complete? When do I feel most connected to the rest of the world? Where do I find the most inner strength? What am I doing when I feel whole?  Helpful tips: Being in nature. Some people feel very connected and at peace when they are walking outdoors or are outside. Helping others. Some feel the largest sense of wellbeing when they are of service to others. Being of service can take on many forms. It can be doing volunteer work, being kind to strangers, or offering a hand to a friend in need. Gratitude. Some people find they feel the most connected when they remain grateful. They may make lists of all the things they are grateful for or say a thank you out loud for all they have.    Emotional Health Are you in tune with your emotional health?  Check out this link: http://[email or web address]-love.com/    Financial Health Make sure you use a budget for your personal finances Make sure you are insured against risks (health insurance, life insurance, auto insurance, etc) Save more, spend less Set financial goals If you need help in this area, good resources include counseling through [employer] or other community resources, have a meeting with a Social research officer, government, and a good resource is the [employer] "Kathlene November" was seen today for annual exam.  Diagnoses and all orders for this visit:  Encounter for health maintenance examination in adult -     Urinalysis, Routine w reflex  microscopic -     Hemoglobin A1c -     Comprehensive metabolic panel with GFR -     CBC -     Lipid panel -     PSA -     TSH + free T4  Type 2 diabetes mellitus with hyperglycemia, without long-term current use of insulin (HCC) -     Hemoglobin A1c  Anemia, unspecified type  Aortic atherosclerosis (HCC) -     Lipid panel  Essential hypertension, benign  Gastroesophageal reflux disease, unspecified whether esophagitis present  Mixed hyperlipidemia -     Hemoglobin A1c -     Lipid panel  Microalbuminuria  Vaccine refused by patient  Screening for prostate cancer -     PSA  Follow-up pending labs, yearly for physical

## 2023-07-25 ENCOUNTER — Other Ambulatory Visit: Payer: Self-pay | Admitting: Medical

## 2023-07-25 DIAGNOSIS — I10 Essential (primary) hypertension: Secondary | ICD-10-CM

## 2023-07-25 LAB — TSH+FREE T4
Free T4: 1.28 ng/dL (ref 0.82–1.77)
TSH: 3.12 u[IU]/mL (ref 0.450–4.500)

## 2023-07-25 LAB — COMPREHENSIVE METABOLIC PANEL WITH GFR
ALT: 25 IU/L (ref 0–44)
AST: 24 IU/L (ref 0–40)
Albumin: 3.9 g/dL (ref 3.8–4.9)
Alkaline Phosphatase: 105 IU/L (ref 44–121)
BUN/Creatinine Ratio: 12 (ref 9–20)
BUN: 14 mg/dL (ref 6–24)
Bilirubin Total: 0.3 mg/dL (ref 0.0–1.2)
CO2: 23 mmol/L (ref 20–29)
Calcium: 9.7 mg/dL (ref 8.7–10.2)
Chloride: 101 mmol/L (ref 96–106)
Creatinine, Ser: 1.17 mg/dL (ref 0.76–1.27)
Globulin, Total: 3.5 g/dL (ref 1.5–4.5)
Glucose: 127 mg/dL — ABNORMAL HIGH (ref 70–99)
Potassium: 4.5 mmol/L (ref 3.5–5.2)
Sodium: 138 mmol/L (ref 134–144)
Total Protein: 7.4 g/dL (ref 6.0–8.5)
eGFR: 74 mL/min/{1.73_m2} (ref >59)

## 2023-07-25 LAB — URINALYSIS, ROUTINE W REFLEX MICROSCOPIC
Bilirubin, UA: NEGATIVE
Ketones, UA: NEGATIVE
Leukocytes,UA: NEGATIVE
Nitrite, UA: NEGATIVE
RBC, UA: NEGATIVE
Specific Gravity, UA: 1.027 (ref 1.005–1.030)
Urobilinogen, Ur: 0.2 mg/dL (ref 0.2–1.0)
pH, UA: 5.5 (ref 5.0–7.5)

## 2023-07-25 LAB — CBC
Hematocrit: 41.5 % (ref 37.5–51.0)
Hemoglobin: 13.2 g/dL (ref 13.0–17.7)
MCH: 28.4 pg (ref 26.6–33.0)
MCHC: 31.8 g/dL (ref 31.5–35.7)
MCV: 89 fL (ref 79–97)
Platelets: 379 10*3/uL (ref 150–450)
RBC: 4.64 x10E6/uL (ref 4.14–5.80)
RDW: 14.5 % (ref 11.6–15.4)
WBC: 6.1 10*3/uL (ref 3.4–10.8)

## 2023-07-25 LAB — MICROSCOPIC EXAMINATION

## 2023-07-25 LAB — LIPID PANEL
Cholesterol, Total: 149 mg/dL (ref 100–199)
HDL: 41 mg/dL (ref >39)
LDL CALC COMMENT:: 3.6 ratio (ref 0.0–5.0)
LDL Chol Calc (NIH): 81 mg/dL (ref 0–99)
Triglycerides: 157 mg/dL — ABNORMAL HIGH (ref 0–149)
VLDL Cholesterol Cal: 27 mg/dL (ref 5–40)

## 2023-07-25 LAB — PSA

## 2023-07-25 LAB — HEMOGLOBIN A1C
Est. average glucose Bld gHb Est-mCnc: 169 mg/dL
Hgb A1c MFr Bld: 7.5 % — ABNORMAL HIGH (ref 4.8–5.6)

## 2023-07-25 MED ORDER — MOUNJARO 12.5 MG/0.5ML ~~LOC~~ SOAJ
12.5000 mg | SUBCUTANEOUS | 1 refills | Status: DC
Start: 2023-07-25 — End: 2023-10-27

## 2023-07-25 NOTE — Progress Notes (Signed)
 Labs show some protein in the urine, diabetes marker not at goal 7.5%, liver kidney and electrolytes okay, cholesterol overall okay, blood counts okay, prostate marker okay, thyroid okay.  I recommend switching to [facility]  Maybe you will respond better to that than the Ozempic.  Continue rest of medicines as usual.  I strongly recommend you do a follow-up with the bariatric clinic  I recommend cutting out all sugary drinks, I recommend no junk food such as cookies, cakes, chips, pies, I recommend limiting meat portion size to 5 ounces or less, and for the next 30 days if possible cut out all bread or pasta or rice  Recheck in 1 to 2 months

## 2023-07-28 ENCOUNTER — Other Ambulatory Visit (HOSPITAL_COMMUNITY): Payer: Self-pay

## 2023-07-28 ENCOUNTER — Telehealth: Payer: Self-pay

## 2023-07-28 NOTE — Telephone Encounter (Signed)
 Pharmacy Patient Advocate Encounter   Received notification from Physician's Office that prior authorization for Mounjaro 12.5MG /0.5ML auto-injectors is required/requested.   Insurance verification completed.   The patient is insured through  [employer]  .   Per test claim: PA required; PA submitted to above mentioned insurance via CoverMyMeds Key/confirmation #/EOC (Key: [id number])  Status is pending

## 2023-07-29 ENCOUNTER — Other Ambulatory Visit (HOSPITAL_COMMUNITY): Payer: Self-pay

## 2023-07-29 DIAGNOSIS — Z1211 Encounter for screening for malignant neoplasm of colon: Secondary | ICD-10-CM | POA: Diagnosis not present

## 2023-07-29 NOTE — Telephone Encounter (Signed)
 Pharmacy Patient Advocate Encounter  Received notification from  [employer]   that Prior Authorization for [facility] 12.5MG /0.5ML auto-injectors has been APPROVED from 3.31.25 to 3.31.26. Ran test claim, Copay is $4.00. This test claim was processed through [facility]- copay amounts may vary at other pharmacies due to pharmacy/plan contracts, or as the patient moves through the different stages of their insurance plan.   PA #/Case ID/Reference #: (Key: [id number])

## 2023-08-01 LAB — FECAL OCCULT BLOOD, IMMUNOCHEMICAL: Fecal Occult Bld: NEGATIVE

## 2023-08-04 NOTE — Progress Notes (Signed)
 Results sent through MyChart

## 2023-10-26 ENCOUNTER — Other Ambulatory Visit: Payer: Self-pay | Admitting: Medical

## 2023-11-24 ENCOUNTER — Encounter (HOSPITAL_COMMUNITY): Payer: Self-pay | Admitting: Emergency Medicine

## 2023-11-24 ENCOUNTER — Emergency Department (HOSPITAL_COMMUNITY)
Admission: EM | Admit: 2023-11-24 | Discharge: 2023-11-24 | Disposition: A | Discharge status: Home / Self Care | Attending: Emergency Medicine | Admitting: Emergency Medicine

## 2023-11-24 ENCOUNTER — Ambulatory Visit: Payer: Self-pay

## 2023-11-24 ENCOUNTER — Ambulatory Visit: Admitting: Medical

## 2023-11-24 DIAGNOSIS — Z7982 Long term (current) use of aspirin: Secondary | ICD-10-CM | POA: Diagnosis not present

## 2023-11-24 DIAGNOSIS — L509 Urticaria, unspecified: Secondary | ICD-10-CM | POA: Diagnosis not present

## 2023-11-24 DIAGNOSIS — E119 Type 2 diabetes mellitus without complications: Secondary | ICD-10-CM | POA: Insufficient documentation

## 2023-11-24 DIAGNOSIS — I10 Essential (primary) hypertension: Secondary | ICD-10-CM | POA: Diagnosis not present

## 2023-11-24 DIAGNOSIS — R21 Rash and other nonspecific skin eruption: Secondary | ICD-10-CM | POA: Diagnosis present

## 2023-11-24 MED ORDER — PREDNISONE 50 MG PO TABS
60.0000 mg | ORAL_TABLET | Freq: Once | ORAL | Status: AC
  Administered 2023-11-24: 60 mg via ORAL
  Filled 2023-11-24: qty 1

## 2023-11-24 MED ORDER — PREDNISONE 10 MG PO TABS: 40.0000 mg | ORAL_TABLET | Freq: Every day | ORAL | 0 refills | Status: DC

## 2023-11-24 NOTE — Telephone Encounter (Signed)
 Patient is already in ER

## 2023-11-24 NOTE — Telephone Encounter (Signed)
 FYI Only or Action Required?: FYI only for provider.  Patient was last seen in primary care on 07/24/2023 by Bulah Alm RAMAN, PA-C.  Called Nurse Triage reporting Urticaria and severe itching.  Symptoms began yesterday.  Interventions attempted: OTC medications: just recently gave benadryl.  Symptoms are: rapidly worsening.  Triage Disposition: Go to ED Now (Notify PCP)  Patient/caregiver understands and will follow disposition?: Unsure - did not confirm or deny intent to go to ED      Copied from CRM #[id number]. Topic: Clinical - Red Word Triage >> Nov 24, 2023  2:04 PM Selinda RAMAN wrote: Red Word that prompted transfer to Nurse Triage: The patient called in stating he has been broke out with hives really bad since last night. The only thing he has done is given his pets a bath. The spouse is on the line as well and says the hives are all over and getting worse. I will transfer him to [facility] Reason for Disposition  [1] Widespread hives, itching or face swelling AND [2] onset < 2 hours of exposure to high-risk allergen (e.g., sting, nuts, 1st dose of antibiotic)  Answer Assessment - Initial Assessment Questions 1. APPEARANCE: What does the rash look like?      Like welts and hives like with a bee sting 2. LOCATION: Where is the rash located?      Covered, different sizes, all over, started on back but now everywhere, stomach back buttocks neck arms 5. ONSET: When did the hives begin? (e.g., hours or days ago)      Last night, washed pets late evening then got in bed and started, I always use it so he never used it so maybe the shampoo did something for it 6. ITCHING: Does it itch? If Yes, ask: How bad is the itch?  (e.g., none, mild, moderate, severe)     Severe, can't stop scratching 7. RECURRENT PROBLEM: Have you had hives before? If Yes, ask: When was the last time? and What happened that time?      Not that I can recall 8. TRIGGERS: Were you exposed to any new  food, plant, cosmetic product or animal just before the hives began?     Used a pet shampoo never had been in contact with before, hives started shortly thereafter He has sensitive skin 9. OTHER SYMPTOMS: Do you have any other symptoms? (e.g., fever, tongue swelling, difficulty breathing, abdomen pain)     No hives to face or facial swelling, no tongue swelling or SOB, no fever, no abdominal pain, no hoarseness or cough, no cold/pale/clammy skin or too weak to stand    Just gave him some benadryl for the first time  Horrible waiting in there in ED, asking for appt or go to UC, advised that UC or office would not have resources needed to care fully for pt if further worsening like tongue swelling or SOB    Strongly advised ED for rapidly worsening reaction, pt wife verbalized understanding. Sending message to PCP for follow up.  Protocols used: Hives-A-AH

## 2023-11-24 NOTE — ED Provider Notes (Signed)
 [name]PART[facility] AT [facility] Provider Note   CSN: [id number] Arrival date & time: 11/24/23  1441     Patient presents with: Rash   Christopher Skinner is a 53 y.o. male.    Rash Patient presents for rash.  Medical history includes HTN, HLD, obesity, DM, GERD, anemia, sleep apnea.  Today, he was in his normal state of health.  Last night, he noticed a itchy rash on arms and back.  He had some elevated wheals on skin.  He is taken Benadryl at home.  Distribution of the rash has improved.  He denies any other associated symptoms.  He denies history of allergic reactions.  Per chart review, he has developed a rash from Trulicity .  He denies any new medication changes.  He feels like it may be related to washing his dog last night.  He is unsure if the shampoo was new.     Prior to Admission medications   Medication Sig Start Date End Date Taking? Authorizing Provider  predniSONE  (DELTASONE ) 10 MG tablet Take 4 tablets (40 mg total) by mouth daily for 3 days. Starting tomorrow 11/24/23 11/27/23 Yes Melvenia Motto, MD  albuterol  (VENTOLIN  HFA) 108 (90 Base) MCG/ACT inhaler Inhale 2 puffs into the lungs every 4 (four) hours as needed for wheezing or shortness of breath. Patient not taking: Reported on 07/24/2023 12/24/22   Tysinger, Alm RAMAN, PA-C  aspirin  EC 81 MG tablet Take 1 tablet (81 mg total) by mouth daily. Swallow whole. 12/23/22   Tysinger, Alm RAMAN, PA-C  atorvastatin  (LIPITOR) 80 MG tablet Take 1 tablet (80 mg total) by mouth daily. 12/23/22   Tysinger, Alm RAMAN, PA-C  blood glucose meter kit and supplies KIT Dispense based on patient and insurance preference. Use 1-2 times daily as directed. (FOR ICD-10 DE11.69). 06/08/19   Tysinger, Alm RAMAN, PA-C  Blood Glucose Monitoring Suppl (TRUE METRIX METER) DEVI 1 Device by Does not apply route daily. 06/10/19   Tysinger, Alm RAMAN, PA-C  carvedilol  (COREG ) 12.5 MG tablet Take 1 tablet (12.5 mg total) by mouth 2 (two) times daily  with a meal. 12/23/22   Tysinger, Alm RAMAN, PA-C  cyanocobalamin (VITAMIN B12) 1000 MCG tablet Take 1 tablet (1,000 mcg total) by mouth daily. 12/23/22   Tysinger, Alm RAMAN, PA-C  empagliflozin  (JARDIANCE ) 25 MG TABS tablet Take 1 tablet (25 mg total) by mouth daily before breakfast. 12/23/22   Tysinger, Alm RAMAN, PA-C  glucose blood test strip True metrix strips to test 1-2 times daily 05/21/21   Tysinger, Alm RAMAN, PA-C  Lancets MISC Use as instructed. Needs lancets based on pt's meter (true metrix) 05/21/21   Tysinger, Alm RAMAN, PA-C  losartan -hydrochlorothiazide  (HYZAAR) 100-12.5 MG tablet Take 1 tablet by mouth daily. 12/23/22   Tysinger, Alm RAMAN, PA-C  MOUNJARO  12.5 MG/0.5ML Pen INJECT 1 PEN SUBCUTANEOUSLY ONCE A WEEK 10/27/23   Tysinger, Alm RAMAN, PA-C  omeprazole  (PRILOSEC) 40 MG capsule Take 1 capsule (40 mg total) by mouth daily. 12/23/22   Tysinger, Alm RAMAN, PA-C    Allergies: Trulicity  [dulaglutide ], Metformin  and related, and Shellfish allergy    Review of Systems  Skin:  Positive for rash.  All other systems reviewed and are negative.   Updated Vital Signs BP 116/63 (BP Location: Right Arm)   Pulse 96   Temp 99.4 F (37.4 C)   Resp (!) 22   Ht 5' 9 (1.753 m)   Wt (!) 216 kg   SpO2 95%  BMI 70.32 kg/m   Physical Exam Vitals and nursing note reviewed.  Constitutional:      General: He is not in acute distress.    Appearance: Normal appearance. He is well-developed. He is not ill-appearing, toxic-appearing or diaphoretic.  HENT:     Head: Normocephalic and atraumatic.     Right Ear: External ear normal.     Left Ear: External ear normal.     Nose: Nose normal.     Mouth/Throat:     Mouth: Mucous membranes are moist.  Eyes:     Extraocular Movements: Extraocular movements intact.     Conjunctiva/sclera: Conjunctivae normal.  Cardiovascular:     Rate and Rhythm: Normal rate and regular rhythm.  Pulmonary:     Effort: Pulmonary effort is normal. No respiratory distress.   Abdominal:     General: There is no distension.     Palpations: Abdomen is soft.  Musculoskeletal:        General: No swelling or deformity.     Cervical back: Normal range of motion and neck supple.  Skin:    General: Skin is warm and dry.     Findings: Rash present.  Neurological:     General: No focal deficit present.     Mental Status: He is alert and oriented to person, place, and time.  Psychiatric:        Mood and Affect: Mood normal.        Behavior: Behavior normal.     (all labs ordered are listed, but only abnormal results are displayed) Labs Reviewed - No data to display  EKG: None  Radiology: No results found.   Procedures   Medications Ordered in the ED  predniSONE  (DELTASONE ) tablet 60 mg (has no administration in time range)                                    Medical Decision Making  Patient presents for pruritic rash.  Onset was last night.  Rash has improved since last night.  On arrival in the ED, vital signs are normal.  Patient is overall well-appearing on exam.  He has a small areas of wheals on his back and upper arms bilaterally.  These areas do remain pruritic.  He states that he had extensive rash to lower arms which has now resolved.  This does seem to be an allergic reaction.  Allergen is indeterminate.  Patient to be prescribed prednisone  and continue Benadryl as needed.  He is stable for discharge.     Final diagnoses:  Urticaria    ED Discharge Orders          Ordered    predniSONE  (DELTASONE ) 10 MG tablet  Daily        11/24/23 1548               Melvenia Motto, MD 11/24/23 1549

## 2023-11-24 NOTE — Discharge Instructions (Addendum)
 You received your first dose of prednisone  in the emergency department.  A prescription for continued prednisone  was sent to your pharmacy.  Take as prescribed for the next 3 days.  Take Benadryl as needed for itching.  Return for any new or worsening symptoms of concern.

## 2023-11-24 NOTE — Telephone Encounter (Signed)
 Pt was advised to go to ED for hives, I do not see he has went. Please schedule an appointment

## 2023-11-24 NOTE — ED Triage Notes (Signed)
 Itchy rash that started after washing his dogs yesterday

## 2023-11-25 ENCOUNTER — Telehealth: Admitting: Family Medicine

## 2023-11-25 ENCOUNTER — Other Ambulatory Visit: Payer: Self-pay | Admitting: Medical

## 2023-11-25 ENCOUNTER — Ambulatory Visit: Payer: Self-pay

## 2023-11-25 DIAGNOSIS — R21 Rash and other nonspecific skin eruption: Secondary | ICD-10-CM

## 2023-11-25 MED ORDER — HYDROXYZINE HCL 10 MG PO TABS: 10.0000 mg | ORAL_TABLET | Freq: Three times a day (TID) | ORAL | 0 refills | Status: AC | PRN

## 2023-11-25 NOTE — Progress Notes (Signed)
 [place]   PCP already addressed issue.  Patient acknowledged agreement and understanding of the plan.

## 2023-11-25 NOTE — Telephone Encounter (Signed)
 FYI Only or Action Required?: FYI only for provider.  Patient was last seen in primary care on 07/24/2023 by Bulah Alm RAMAN, PA-C.  Called Nurse Triage reporting Rash.  Symptoms began yesterday.  Interventions attempted: OTC medications: benadryl  and Prescription medications: prednisone .  Symptoms are: unchanged.  Triage Disposition: See Physician Within 24 Hours  Patient/caregiver understands and will follow disposition?: Yes         Copied from CRM [id number]. Topic: Clinical - Red Word Triage >> Nov 25, 2023 12:32 PM Christopher Skinner wrote: Red Word that prompted transfer to Nurse Triage: Seen at ED. Hives/Breakout. Got better and now is getting worse. Was giving steroid. Unable to take prednisone  - makes him feel like heart attack. Would like to see [name] or see if he can recommend/ refer him somewhere that can help.    ----------------------------------------------------------------------- From previous Reason for Contact - Scheduling: Patient/patient representative is calling to schedule an appointment. Refer to attachments for appointment information. Reason for Disposition  [1] MODERATE-SEVERE hives (e.g.,hives interfere with normal activities or work) AND [2] not improved after taking antihistamine (e.g., cetirizine, fexofenadine, or loratadine) > 24 hours  Answer Assessment - Initial Assessment Questions 1. APPEARANCE of RASH: What does the rash look like? (e.g., blisters, dry flaky skin, red spots, redness, sores)     Red with small bumps  2. SIZE: How big are the spots? (e.g., tip of pen, eraser, coin; inches, centimeters)     Quarter size 3. LOCATION: Where is the rash located?     Arms  4. COLOR: What color is the rash? (Note: It is difficult to assess rash color in people with darker-colored skin. When this situation occurs, simply ask the caller to describe what they see.)     Reddish with small bumps- looked like hives 5. ONSET: When did the rash  begin?     yesterday 6. FEVER: Do you have a fever? If Yes, ask: What is your temperature, how was it measured, and when did it start?     no 7. ITCHING: Does the rash itch? If Yes, ask: How bad is the itch? (Scale 1-10; or mild, moderate, severe)     bad 8. CAUSE: What do you think is causing the rash?     unknown 9. MEDICINE FACTORS: Have you started any new medicines within the last 2 weeks? (e.g., antibiotics)      no 10. OTHER SYMPTOMS: Do you have any other symptoms? (e.g., dizziness, headache, sore throat, joint pain)       Palpitation and hallucinations  on prednisone   so he stopped it  Protocols used: Rash or Redness - Widespread-A-AH, Hives-A-AH

## 2023-11-25 NOTE — Telephone Encounter (Signed)
 Pt was notified.

## 2023-11-25 NOTE — Telephone Encounter (Signed)
 Pt states he was given prednisone  in the ED last night and that when he took it that he felt his heart pounding and felt faint, states he stopped taking the medication but then the hive/rash came back. States he took benadryl this morning and hives are going away but as soon as the benadryl wears off the rash comes back.

## 2023-12-01 ENCOUNTER — Ambulatory Visit: Admitting: Medical

## 2023-12-02 ENCOUNTER — Other Ambulatory Visit: Payer: Self-pay | Admitting: Medical

## 2023-12-03 ENCOUNTER — Ambulatory Visit: Admitting: Medical

## 2023-12-03 VITALS — BP 112/70 | HR 95 | Wt >= 6400 oz

## 2023-12-03 DIAGNOSIS — I700 Atherosclerosis of aorta: Secondary | ICD-10-CM | POA: Diagnosis not present

## 2023-12-03 DIAGNOSIS — E1165 Type 2 diabetes mellitus with hyperglycemia: Secondary | ICD-10-CM | POA: Diagnosis not present

## 2023-12-03 DIAGNOSIS — E569 Vitamin deficiency, unspecified: Secondary | ICD-10-CM

## 2023-12-03 DIAGNOSIS — I10 Essential (primary) hypertension: Secondary | ICD-10-CM

## 2023-12-03 DIAGNOSIS — Z2820 Immunization not carried out because of patient decision for unspecified reason: Secondary | ICD-10-CM

## 2023-12-03 DIAGNOSIS — E782 Mixed hyperlipidemia: Secondary | ICD-10-CM

## 2023-12-03 DIAGNOSIS — G4733 Obstructive sleep apnea (adult) (pediatric): Secondary | ICD-10-CM

## 2023-12-03 DIAGNOSIS — E349 Endocrine disorder, unspecified: Secondary | ICD-10-CM

## 2023-12-03 DIAGNOSIS — H9313 Tinnitus, bilateral: Secondary | ICD-10-CM

## 2023-12-03 LAB — POCT GLYCOSYLATED HEMOGLOBIN (HGB A1C): Hemoglobin A1C: 7 % — AB (ref 4.0–5.6)

## 2023-12-03 MED ORDER — EMPAGLIFLOZIN 25 MG PO TABS: 25.0000 mg | ORAL_TABLET | Freq: Every day | ORAL | 2 refills | Status: AC

## 2023-12-03 MED ORDER — CARVEDILOL 12.5 MG PO TABS: 12.5000 mg | ORAL_TABLET | Freq: Two times a day (BID) | ORAL | 2 refills | Status: AC

## 2023-12-03 MED ORDER — OMEPRAZOLE 40 MG PO CPDR: 40.0000 mg | DELAYED_RELEASE_CAPSULE | Freq: Every day | ORAL | 2 refills | Status: AC

## 2023-12-03 MED ORDER — TIRZEPATIDE 15 MG/0.5ML ~~LOC~~ SOAJ: 15.0000 mg | SUBCUTANEOUS | 5 refills | Status: AC

## 2023-12-03 MED ORDER — ATORVASTATIN CALCIUM 80 MG PO TABS: 80.0000 mg | ORAL_TABLET | Freq: Every day | ORAL | 2 refills | Status: AC

## 2023-12-03 MED ORDER — LOSARTAN POTASSIUM-HCTZ 100-12.5 MG PO TABS: 1.0000 | ORAL_TABLET | Freq: Every day | ORAL | 2 refills | Status: AC

## 2023-12-03 MED ORDER — ASPIRIN 81 MG PO TBEC: 81.0000 mg | DELAYED_RELEASE_TABLET | Freq: Every day | ORAL | 2 refills | Status: AC

## 2023-12-03 MED ORDER — FLUTICASONE PROPIONATE 50 MCG/ACT NA SUSP: 2.0000 | Freq: Every day | NASAL | 1 refills | Status: DC

## 2023-12-03 NOTE — Progress Notes (Signed)
 Subjective:  MCCOY TESTA is a 53 y.o. male who presents for Chief Complaint  Patient presents with   Follow-up    4 month follow-up. Still having ringing in the ears- ear drops aren't working.     Here for med check  Diabetes-compliant with Mounjaro  12.5 mg weekly without any side effect.  Compliant with Jardiance  25 mg daily  He has fortunately lost some recent weight.  He is working on exercise and healthier eating habits  Hypertension-compliant with carvedilol  12.5 mg twice daily and losartan  HCT 100/12.5 mg daily  Hyperlipidemia-compliant with atorvastatin  80 mg daily and aspirin  81 milligrams daily.  He has ongoing ringing in the ears in the last couple weeks.  No ear pain, no fever, no congestion, no current cough, runny nose, no sneezing, no drainage from ear.  No prior ENT consult for this.  Denies being exposed to loud noises.  No history of hearing loss.  Otherwise normal state of health  No other aggravating or relieving factors.    No other c/o.  Past Medical History:  Diagnosis Date   COVID-19 05/02/2019   Diabetes mellitus without complication (HCC) 2019   GERD (gastroesophageal reflux disease)    H/O exercise stress test 2009   reportedly normal per patient   Hyperlipidemia    Hypertension 2008   Microalbuminuria 2019   Obesity    Sleep apnea    last used CPAP 2014; quit on his own   Current Outpatient Medications on File Prior to Visit  Medication Sig Dispense Refill   cyanocobalamin (VITAMIN B12) 1000 MCG tablet Take 1 tablet (1,000 mcg total) by mouth daily. 90 tablet 2   hydrOXYzine  (ATARAX ) 10 MG tablet Take 1 tablet (10 mg total) by mouth 3 (three) times daily as needed. 30 tablet 0   albuterol  (VENTOLIN  HFA) 108 (90 Base) MCG/ACT inhaler Inhale 2 puffs into the lungs every 4 (four) hours as needed for wheezing or shortness of breath. (Patient not taking: Reported on 12/03/2023) 1 each 0   blood glucose meter kit and supplies KIT Dispense based  on patient and insurance preference. Use 1-2 times daily as directed. (FOR ICD-10 DE11.69). 1 each 0   Blood Glucose Monitoring Suppl (TRUE METRIX METER) DEVI 1 Device by Does not apply route daily. 1 each 0   glucose blood test strip True metrix strips to test 1-2 times daily 100 each 12   Lancets MISC Use as instructed. Needs lancets based on pt's meter (true metrix) 100 each 12   No current facility-administered medications on file prior to visit.   Past Surgical History:  Procedure Laterality Date   CARPAL TUNNEL RELEASE     left   COLONOSCOPY  2010   normal per patient   COLONOSCOPY WITH PROPOFOL  N/A 02/08/2021   Procedure: COLONOSCOPY WITH PROPOFOL ;  Surgeon: Avram Lupita FORBES, MD;  Location: [facility];  Service: Endoscopy;  Laterality: N/A;   HAND SURGERY     growth resection, right   POLYPECTOMY  02/08/2021   Procedure: POLYPECTOMY;  Surgeon: Avram Lupita FORBES, MD;  Location: [facility];  Service: Endoscopy;;    The following portions of the patient's history were reviewed and updated as appropriate: allergies, current medications, past family history, past medical history, past social history, past surgical history and problem list.  ROS Otherwise as in subjective above    Objective: BP 112/70   Pulse 95   Wt (!) 467 lb 12.8 oz (212.2 kg)   SpO2 100%   BMI  69.08 kg/m   Wt Readings from Last 3 Encounters:  12/03/23 (!) 467 lb 12.8 oz (212.2 kg)  11/24/23 (!) 476 lb 3.1 oz (216 kg)  07/24/23 (!) 477 lb 6.4 oz (216.5 kg)   BP Readings from Last 3 Encounters:  12/03/23 112/70  11/24/23 116/63  07/24/23 124/80    General appearance: alert, no distress, well developed, well nourished HEENT: normocephalic, sclerae anicteric, conjunctiva pink and moist, TMs pearly, nares patent, no discharge or erythema, pharynx normal Oral cavity: MMM, no lesions Neck: supple, no lymphadenopathy, no thyromegaly, no masses Heart: RRR, normal S1, S2, no murmurs Lungs: CTA  bilaterally, no wheezes, rhonchi, or rales Pulses: 2+ radial pulses, 2+ pedal pulses, normal cap refill Ext: no edema   Assessment: Encounter Diagnoses  Name Primary?   Type 2 diabetes mellitus with hyperglycemia, without long-term current use of insulin  (HCC) Yes   Essential hypertension, benign    Mixed hyperlipidemia    Morbid obesity (HCC)    OSA (obstructive sleep apnea)    Vaccine refused by patient    Aortic atherosclerosis (HCC)    Tinnitus of both ears    Abnormality of hormone    Vitamin deficiency      Plan: Diabetes Increase to the highest dose of Mounjaro  15 mg weekly Continue Jardiance  25 mg daily Monitor your blood sugars with goal less than 130 fasting Hgba1c 7.0% today  Obesity, BMI greater than 60 Congratulations on losing some weight Increase to Mounjaro  15 mg weekly Continue work on getting exercise most days.  Eat a healthy low sugar low-fat diet  Hypertension continue carvedilol  12.5 mg twice daily Continue losartan  HCT 100/12.5 mg daily  Hyperlipidemia, aortic atherosclerosis Continue atorvastatin  80 mg daily Continue aspirin  81 mg daily  Ringing in the ears Begin trial of Flonase  daily nasal spray to see if this helps Referral placed today for ENT consult  History of abnormal hormone/low PSA - come back next week fasting for morning labs  Spent > 45 minutes face to face with patient in discussion of symptoms, evaluation, plan and recommendations.    Jeffree Cazeau was seen today for follow-up.  Diagnoses and all orders for this visit:  Type 2 diabetes mellitus with hyperglycemia, without long-term current use of insulin  (HCC)  Essential hypertension, benign -     carvedilol  (COREG ) 12.5 MG tablet; Take 1 tablet (12.5 mg total) by mouth 2 (two) times daily with a meal. -     losartan -hydrochlorothiazide  (HYZAAR) 100-12.5 MG tablet; Take 1 tablet by mouth daily.  Mixed hyperlipidemia  Morbid obesity (HCC)  OSA (obstructive sleep  apnea)  Vaccine refused by patient  Aortic atherosclerosis (HCC)  Tinnitus of both ears -     Ambulatory referral to ENT  Abnormality of hormone -     Testosterone; Future -     Cortisol; Future -     Prolactin; Future  Vitamin deficiency -     Vitamin B12; Future  Other orders -     tirzepatide  (MOUNJARO ) 15 MG/0.5ML Pen; Inject 15 mg into the skin once a week. -     fluticasone  (FLONASE ) 50 MCG/ACT nasal spray; Place 2 sprays into both nostrils daily. -     atorvastatin  (LIPITOR) 80 MG tablet; Take 1 tablet (80 mg total) by mouth daily. -     aspirin  EC 81 MG tablet; Take 1 tablet (81 mg total) by mouth daily. Swallow whole. -     omeprazole  (PRILOSEC) 40 MG capsule; Take 1 capsule (40 mg total)  by mouth daily. -     empagliflozin  (JARDIANCE ) 25 MG TABS tablet; Take 1 tablet (25 mg total) by mouth daily before breakfast.    Follow up: pending call back

## 2023-12-03 NOTE — Addendum Note (Signed)
 Addended by: VICCI HUSBAND A on: 12/03/2023 10:03 AM   Modules accepted: Orders

## 2023-12-03 NOTE — Patient Instructions (Signed)
 Diabetes Increase to the highest dose of Mounjaro  15 mg weekly Continue Jardiance  25 mg daily Monitor your blood sugars with goal less than 130 fasting Hgba1c 7% today  Obesity, BMI greater than 60 Congratulations on losing some weight Increase to Mounjaro  15 mg weekly Continue work on getting exercise most days.  Eat a healthy low sugar low-fat diet  Hypertension continue carvedilol  12.5 mg twice daily Continue losartan  HCT 100/12.5 mg daily  Hyperlipidemia, aortic atherosclerosis Continue atorvastatin  80 mg daily Continue aspirin  81 mg daily  Ringing in the ears Begin trial of Flonase  daily nasal spray to see if this helps Referral placed today for ENT consult  History of abnormal hormone/low PSA - come back next week fasting for morning labs

## 2023-12-09 ENCOUNTER — Other Ambulatory Visit

## 2023-12-10 ENCOUNTER — Other Ambulatory Visit

## 2023-12-15 ENCOUNTER — Other Ambulatory Visit

## 2023-12-15 DIAGNOSIS — E569 Vitamin deficiency, unspecified: Secondary | ICD-10-CM | POA: Diagnosis not present

## 2023-12-15 DIAGNOSIS — R972 Elevated prostate specific antigen [PSA]: Secondary | ICD-10-CM | POA: Diagnosis not present

## 2023-12-15 DIAGNOSIS — E291 Testicular hypofunction: Secondary | ICD-10-CM | POA: Diagnosis not present

## 2023-12-15 DIAGNOSIS — E349 Endocrine disorder, unspecified: Secondary | ICD-10-CM

## 2023-12-16 ENCOUNTER — Ambulatory Visit: Payer: Self-pay | Admitting: *Deleted

## 2023-12-16 LAB — CORTISOL: Cortisol: 11.1 ug/dL (ref 6.2–19.4)

## 2023-12-16 LAB — VITAMIN B12: Vitamin B-12: 561 pg/mL (ref 232–1245)

## 2023-12-16 LAB — PROLACTIN: Prolactin: 10.6 ng/mL (ref 3.6–25.2)

## 2023-12-16 LAB — TESTOSTERONE: Testosterone: 57 ng/dL — ABNORMAL LOW (ref 264–916)

## 2023-12-17 ENCOUNTER — Other Ambulatory Visit: Payer: Self-pay | Admitting: Medical

## 2023-12-17 ENCOUNTER — Encounter: Payer: Self-pay | Admitting: Medical

## 2023-12-17 DIAGNOSIS — R972 Elevated prostate specific antigen [PSA]: Secondary | ICD-10-CM | POA: Insufficient documentation

## 2023-12-17 LAB — SPECIMEN STATUS REPORT

## 2023-12-17 LAB — LUTEINIZING HORMONE: LH: 7.7 m[IU]/mL (ref 1.7–8.6)

## 2023-12-17 LAB — FOLLICLE STIMULATING HORMONE: FSH: 7.8 m[IU]/mL (ref 1.5–12.4)

## 2023-12-17 MED ORDER — TESTOSTERONE 20.25 MG/ACT (1.62%) TD GEL: 2.0000 | Freq: Every day | TRANSDERMAL | 1 refills | Status: AC

## 2023-12-17 NOTE — Progress Notes (Signed)
 Your testosterone  is quite low  I do not think you have ever taken testosterone .  If not, I would recommend a trial of testosterone  therapy.  This could be an injection in the office every 2 to 3 weeks, a daily topical gel that you do at home, possibly an oral medication but it is really hard to get covered by insurance  If agreeable let me know which she would like to use or it may be best just to come back and discuss first

## 2023-12-25 ENCOUNTER — Telehealth: Payer: Self-pay | Admitting: Internal Medicine

## 2023-12-25 NOTE — Telephone Encounter (Signed)
 Pt was notified.

## 2023-12-25 NOTE — Telephone Encounter (Signed)
 Copied from CRM [id number]. Topic: Clinical - Medication Question >> Dec 25, 2023 10:27 AM Willma R wrote: Reason for CRM: Patient has a prescription, Testosterone  (ANDROGEL  PUMP) 20.25 MG/ACT (1.62%) GEL to put on his arms. Wants to confirm if should be applying to both arms of just the one?  Patient can be reached at [phone]

## 2024-01-10 ENCOUNTER — Encounter: Payer: Self-pay | Admitting: Emergency Medicine

## 2024-01-10 ENCOUNTER — Ambulatory Visit
Admission: EM | Admit: 2024-01-10 | Discharge: 2024-01-10 | Disposition: A | Discharge status: Home / Self Care | Attending: Nurse Practitioner | Admitting: Nurse Practitioner

## 2024-01-10 DIAGNOSIS — U071 COVID-19: Secondary | ICD-10-CM | POA: Diagnosis not present

## 2024-01-10 LAB — POC SOFIA SARS ANTIGEN FIA: SARS Coronavirus 2 Ag: POSITIVE — AB

## 2024-01-10 MED ORDER — PROMETHAZINE-DM 6.25-15 MG/5ML PO SYRP: 5.0000 mL | ORAL_SOLUTION | Freq: Four times a day (QID) | ORAL | 0 refills | Status: AC | PRN

## 2024-01-10 MED ORDER — PAXLOVID (300/100) 20 X 150 MG & 10 X 100MG PO TBPK: 3.0000 | ORAL_TABLET | Freq: Two times a day (BID) | ORAL | 0 refills | Status: AC

## 2024-01-10 MED ORDER — FLUTICASONE PROPIONATE 50 MCG/ACT NA SUSP: 2.0000 | Freq: Every day | NASAL | 0 refills | Status: AC

## 2024-01-10 NOTE — ED Triage Notes (Signed)
 Productive Cough, sore throat, nasal congestion since Tuesday.  Has been taking nyquil, dayquil and mucinex with little relief.

## 2024-01-10 NOTE — ED Provider Notes (Signed)
[facility]    CSN: [id number] Arrival date & time: 01/10/24  0803      History   Chief Complaint No chief complaint on file.   HPI Christopher Skinner is a 53 y.o. male.   The history is provided by the patient.   Patient presents with a 4-day history of sore throat, nasal congestion, and cough.  Patient reports that he feels like that he may have had a fever.  He denies headache, ear pain, wheezing, difficulty breathing, abdominal pain, nausea, vomiting, diarrhea, or rash.  Patient reports that his wife was sick first prior to his symptoms starting.  Reports he has taken several over-the-counter medications with minimal relief.  Past Medical History:  Diagnosis Date   COVID-19 05/02/2019   Diabetes mellitus without complication (HCC) 2019   GERD (gastroesophageal reflux disease)    H/O exercise stress test 2009   reportedly normal per patient   Hyperlipidemia    Hypertension 2008   Microalbuminuria 2019   Obesity    Sleep apnea    last used CPAP 2014; quit on his own    Patient Active Problem List   Diagnosis Date Noted   Abnormal PSA 12/17/2023   Neck pain on right side 06/20/2021   BMI 60.0-69.9, adult (HCC) 05/21/2021   Vaccine refused by patient 10/31/2020   Encounter for health maintenance examination in adult 10/31/2020   Anhidrosis 12/21/2019   Hepatomegaly 06/23/2019   Aortic atherosclerosis (HCC) 06/23/2019   Anemia 06/23/2019   Chronic pain of right knee 04/16/2019   Essential hypertension, benign 10/05/2018   Hyperlipidemia 10/05/2018   Microalbuminuria 10/05/2018   Gastroesophageal reflux disease 10/05/2018   Vaccine counseling 10/05/2018   Type 2 diabetes mellitus with hyperglycemia (HCC) 10/05/2018   OSA (obstructive sleep apnea) 10/05/2018   Morbid obesity (HCC) 10/26/2012    Past Surgical History:  Procedure Laterality Date   CARPAL TUNNEL RELEASE     left   COLONOSCOPY  2010   normal per patient   COLONOSCOPY WITH  PROPOFOL  N/A 02/08/2021   Procedure: COLONOSCOPY WITH PROPOFOL ;  Surgeon: Avram Lupita FORBES, MD;  Location: [facility];  Service: Endoscopy;  Laterality: N/A;   HAND SURGERY     growth resection, right   POLYPECTOMY  02/08/2021   Procedure: POLYPECTOMY;  Surgeon: Avram Lupita FORBES, MD;  Location: [facility];  Service: Endoscopy;;       Home Medications    Prior to Admission medications   Medication Sig Start Date End Date Taking? Authorizing Provider  fluticasone  (FLONASE ) 50 MCG/ACT nasal spray Place 2 sprays into both nostrils daily. 01/10/24  Yes Leath-Warren, Etta PARAS, NP  nirmatrelvir/ritonavir (PAXLOVID , 300/100,) 20 x 150 MG & 10 x 100MG  TBPK Take 3 tablets by mouth 2 (two) times daily for 5 days. Patient GFR is >60. Take nirmatrelvir (150 mg) two tablets twice daily for 5 days and ritonavir (100 mg) one tablet twice daily for 5 days. 01/10/24 01/15/24 Yes Leath-Warren, Etta PARAS, NP  promethazine -dextromethorphan (PROMETHAZINE -DM) 6.25-15 MG/5ML syrup Take 5 mLs by mouth 4 (four) times daily as needed. 01/10/24  Yes Leath-Warren, Etta PARAS, NP  albuterol  (VENTOLIN  HFA) 108 (90 Base) MCG/ACT inhaler Inhale 2 puffs into the lungs every 4 (four) hours as needed for wheezing or shortness of breath. Patient not taking: Reported on 12/03/2023 12/24/22   Tysinger, Alm RAMAN, PA-C  aspirin  EC 81 MG tablet Take 1 tablet (81 mg total) by mouth daily. Swallow whole. 12/03/23   Tysinger, Alm RAMAN, PA-C  atorvastatin  (LIPITOR) 80 MG tablet Take 1 tablet (80 mg total) by mouth daily. 12/03/23   Tysinger, Alm RAMAN, PA-C  blood glucose meter kit and supplies KIT Dispense based on patient and insurance preference. Use 1-2 times daily as directed. (FOR ICD-10 DE11.69). 06/08/19   Tysinger, Alm RAMAN, PA-C  Blood Glucose Monitoring Suppl (TRUE METRIX METER) DEVI 1 Device by Does not apply route daily. 06/10/19   Tysinger, Alm RAMAN, PA-C  carvedilol  (COREG ) 12.5 MG tablet Take 1 tablet (12.5 mg total) by mouth 2 (two)  times daily with a meal. 12/03/23   Tysinger, Alm RAMAN, PA-C  cyanocobalamin (VITAMIN B12) 1000 MCG tablet Take 1 tablet (1,000 mcg total) by mouth daily. 12/23/22   Tysinger, Alm RAMAN, PA-C  empagliflozin  (JARDIANCE ) 25 MG TABS tablet Take 1 tablet (25 mg total) by mouth daily before breakfast. 12/03/23   Tysinger, Alm RAMAN, PA-C  glucose blood test strip True metrix strips to test 1-2 times daily 05/21/21   Tysinger, Alm RAMAN, PA-C  hydrOXYzine  (ATARAX ) 10 MG tablet Take 1 tablet (10 mg total) by mouth 3 (three) times daily as needed. 11/25/23   Tysinger, Alm RAMAN, PA-C  Lancets MISC Use as instructed. Needs lancets based on pt's meter (true metrix) 05/21/21   Tysinger, Alm RAMAN, PA-C  losartan -hydrochlorothiazide  (HYZAAR) 100-12.5 MG tablet Take 1 tablet by mouth daily. 12/03/23   Tysinger, Alm RAMAN, PA-C  omeprazole  (PRILOSEC) 40 MG capsule Take 1 capsule (40 mg total) by mouth daily. 12/03/23   Tysinger, Alm RAMAN, PA-C  Testosterone  (ANDROGEL  PUMP) 20.25 MG/ACT (1.62%) GEL Place 2 Pump onto the skin daily. 12/17/23   Tysinger, Alm RAMAN, PA-C  tirzepatide  (MOUNJARO ) 15 MG/0.5ML Pen Inject 15 mg into the skin once a week. 12/03/23   Tysinger, Alm RAMAN, PA-C    Family History Family History  Problem Relation Age of Onset   Stroke Mother    Hypertension Mother    Hypertension Father    Hypertension Sister    Hypertension Sister    Hypertension Sister    Hypertension Sister    Hypertension Sister    Hypertension Sister    Hypertension Sister    Hypertension Sister    Hypertension Sister    Hypertension Brother    Hypertension Brother    Hypertension Brother    Hypertension Brother    Stomach cancer Brother    Hypertension Brother    Hypertension Brother    Hypertension Brother    Hypertension Brother    Hypertension Brother    Hypertension Brother    Hypertension Brother    Hypertension Brother    Hypertension Brother    Hypertension Brother    Hypertension Brother    Hypertension Brother     Hypertension Brother    Hyperlipidemia Other    Hypertension Other    Sleep apnea Other    Obesity Other    Heart disease Neg Hx    Cancer Neg Hx    Diabetes Neg Hx    Colon cancer Neg Hx    Colon polyps Neg Hx    Esophageal cancer Neg Hx    Rectal cancer Neg Hx     Social History Social History   Tobacco Use   Smoking status: Never    Passive exposure: Never   Smokeless tobacco: Never  Vaping Use   Vaping status: Never Used  Substance Use Topics   Alcohol use: No   Drug use: No     Allergies   Trulicity  [dulaglutide ], Metformin  and  related, and Shellfish allergy   Review of Systems Review of Systems Per HPI  Physical Exam Triage Vital Signs ED Triage Vitals  Encounter Vitals Group     BP 01/10/24 0816 (!) 163/88     Girls Systolic BP Percentile --      Girls Diastolic BP Percentile --      Boys Systolic BP Percentile --      Boys Diastolic BP Percentile --      Pulse Rate 01/10/24 0816 96     Resp 01/10/24 0816 18     Temp 01/10/24 0816 98.4 F (36.9 C)     Temp Source 01/10/24 0816 Oral     SpO2 01/10/24 0816 94 %     Weight --      Height --      Head Circumference --      Peak Flow --      Pain Score 01/10/24 0818 7     Pain Loc --      Pain Education --      Exclude from Growth Chart --    No data found.  Updated Vital Signs BP (!) 163/88 (BP Location: Left Arm)   Pulse 96   Temp 98.4 F (36.9 C) (Oral)   Resp 18   SpO2 94%   Visual Acuity Right Eye Distance:   Left Eye Distance:   Bilateral Distance:    Right Eye Near:   Left Eye Near:    Bilateral Near:     Physical Exam Vitals and nursing note reviewed.  Constitutional:      General: He is not in acute distress.    Appearance: Normal appearance.  HENT:     Head: Normocephalic.     Right Ear: Tympanic membrane, ear canal and external ear normal.     Left Ear: Tympanic membrane, ear canal and external ear normal.     Nose: Congestion present.     Right Turbinates:  Enlarged and swollen.     Left Turbinates: Enlarged and swollen.     Right Sinus: No maxillary sinus tenderness or frontal sinus tenderness.     Left Sinus: No maxillary sinus tenderness or frontal sinus tenderness.     Mouth/Throat:     Lips: Pink.     Mouth: Mucous membranes are moist.     Pharynx: Oropharynx is clear. Uvula midline. Posterior oropharyngeal erythema and postnasal drip present. No pharyngeal swelling, oropharyngeal exudate or uvula swelling.     Comments: Cobblestoning present to posterior oropharynx  Eyes:     Extraocular Movements: Extraocular movements intact.     Conjunctiva/sclera: Conjunctivae normal.     Pupils: Pupils are equal, round, and reactive to light.  Cardiovascular:     Rate and Rhythm: Normal rate and regular rhythm.     Pulses: Normal pulses.     Heart sounds: Normal heart sounds.  Pulmonary:     Effort: Pulmonary effort is normal.     Breath sounds: Normal breath sounds.  Abdominal:     General: Bowel sounds are normal.     Palpations: Abdomen is soft.     Tenderness: There is no abdominal tenderness.  Musculoskeletal:     Cervical back: Normal range of motion.  Skin:    General: Skin is warm and dry.  Neurological:     General: No focal deficit present.     Mental Status: He is alert and oriented to person, place, and time.  Psychiatric:  Mood and Affect: Mood normal.        Behavior: Behavior normal.      UC Treatments / Results  Labs (all labs ordered are listed, but only abnormal results are displayed) Labs Reviewed  POC SOFIA SARS ANTIGEN FIA - Abnormal; Notable for the following components:      Result Value   SARS Coronavirus 2 Ag Positive (*)    All other components within normal limits    EKG   Radiology No results found.  Procedures Procedures (including critical care time)  Medications Ordered in UC Medications - No data to display  Initial Impression / Assessment and Plan / UC Course  I have reviewed  the triage vital signs and the nursing notes.  Pertinent labs & imaging results that were available during my care of the patient were reviewed by me and considered in my medical decision making (see chart for details).  COVID test is positive.  Patient has elected to begin Paxlovid .  Will also provide symptomatic treatment with Promethazine  DM for his cough and fluticasone  50 micro nasal spray for nasal congestion and runny nose.  Supportive care recommendations were provided and discussed with the patient to include fluids, rest, over-the-counter analgesics, normal saline nasal spray, warm salt water gargles, and use of a humidifier during sleep.  Discussed indications with patient regarding follow-up along with strict ER follow-up precautions.  Patient was in agreement with this plan of care and verbalizes understanding.  All questions were answered.  Patient stable for discharge.   Final Clinical Impressions(s) / UC Diagnoses   Final diagnoses:  COVID     Discharge Instructions      Your COVID test was positive. Take medication as prescribed.  As discussed, you will need to hold your atorvastatin  for 2 weeks. Increase fluids and allow for plenty of rest. You may take over-the-counter Tylenol  as needed for pain, fever, or general discomfort. Warm salt water gargles 3-4 times daily as needed for throat pain or discomfort. You may also use normal saline nasal spray throughout the day for nasal congestion and runny nose. For your cough, it may be helpful to use a humidifier in your bedroom at nighttime during sleep or to sleep elevated on pillows while symptoms persist. If your fever returns, you will need to remain home until you have been fever free for 24 hours with no medication. You will need to wear your mask while you are taking the medication.  If you complete the medication and you still are experiencing symptoms, you will need to wear your mask for an additional 5 days. Go to the  emergency department if you experience shortness of breath, difficulty breathing, or other concerns. Follow-up as needed.     ED Prescriptions     Medication Sig Dispense Auth. Provider   nirmatrelvir/ritonavir (PAXLOVID , 300/100,) 20 x 150 MG & 10 x 100MG  TBPK Take 3 tablets by mouth 2 (two) times daily for 5 days. Patient GFR is >60. Take nirmatrelvir (150 mg) two tablets twice daily for 5 days and ritonavir (100 mg) one tablet twice daily for 5 days. 30 tablet Leath-Warren, Etta PARAS, NP   promethazine -dextromethorphan (PROMETHAZINE -DM) 6.25-15 MG/5ML syrup Take 5 mLs by mouth 4 (four) times daily as needed. 118 mL Leath-Warren, Etta PARAS, NP   fluticasone  (FLONASE ) 50 MCG/ACT nasal spray Place 2 sprays into both nostrils daily. 16 g Leath-Warren, Etta PARAS, NP      PDMP not reviewed this encounter.   Leath-Warren, Etta PARAS, NP  01/10/24 0834  

## 2024-01-10 NOTE — Discharge Instructions (Addendum)
 Your COVID test was positive. Take medication as prescribed.  As discussed, you will need to hold your atorvastatin  for 2 weeks. Increase fluids and allow for plenty of rest. You may take over-the-counter Tylenol  as needed for pain, fever, or general discomfort. Warm salt water gargles 3-4 times daily as needed for throat pain or discomfort. You may also use normal saline nasal spray throughout the day for nasal congestion and runny nose. For your cough, it may be helpful to use a humidifier in your bedroom at nighttime during sleep or to sleep elevated on pillows while symptoms persist. If your fever returns, you will need to remain home until you have been fever free for 24 hours with no medication. You will need to wear your mask while you are taking the medication.  If you complete the medication and you still are experiencing symptoms, you will need to wear your mask for an additional 5 days. Go to the emergency department if you experience shortness of breath, difficulty breathing, or other concerns. Follow-up as needed.

## 2024-01-16 ENCOUNTER — Ambulatory Visit (INDEPENDENT_AMBULATORY_CARE_PROVIDER_SITE_OTHER): Admitting: Audiology

## 2024-01-16 ENCOUNTER — Institutional Professional Consult (permissible substitution) (INDEPENDENT_AMBULATORY_CARE_PROVIDER_SITE_OTHER)

## 2024-03-04 ENCOUNTER — Ambulatory Visit (INDEPENDENT_AMBULATORY_CARE_PROVIDER_SITE_OTHER): Admitting: Audiology

## 2024-03-04 ENCOUNTER — Institutional Professional Consult (permissible substitution) (INDEPENDENT_AMBULATORY_CARE_PROVIDER_SITE_OTHER)

## 2024-03-05 ENCOUNTER — Other Ambulatory Visit (INDEPENDENT_AMBULATORY_CARE_PROVIDER_SITE_OTHER): Payer: Self-pay

## 2024-03-05 DIAGNOSIS — H9190 Unspecified hearing loss, unspecified ear: Secondary | ICD-10-CM

## 2024-03-08 ENCOUNTER — Encounter: Payer: Self-pay | Admitting: Medical

## 2024-03-29 ENCOUNTER — Ambulatory Visit: Admitting: Medical

## 2024-03-30 ENCOUNTER — Ambulatory Visit (INDEPENDENT_AMBULATORY_CARE_PROVIDER_SITE_OTHER): Admitting: Audiology

## 2024-04-01 ENCOUNTER — Encounter: Payer: Self-pay | Admitting: Medical

## 2024-04-08 ENCOUNTER — Other Ambulatory Visit (HOSPITAL_COMMUNITY): Payer: Self-pay

## 2024-04-12 ENCOUNTER — Telehealth: Payer: Self-pay | Admitting: Pharmacy Technician

## 2024-04-12 NOTE — Telephone Encounter (Signed)
 Pharmacy Patient Advocate Encounter   Received notification from Onbase that prior authorization for Ozempic  (2 MG/DOSE) 8MG /3ML pen-injectors is due for renewal.   Insurance verification completed.   The patient is insured through [facility] MEDICAID.  Action: Medication has been discontinued. Archived Key: [id number]

## 2024-04-23 ENCOUNTER — Institutional Professional Consult (permissible substitution) (INDEPENDENT_AMBULATORY_CARE_PROVIDER_SITE_OTHER)

## 2024-04-28 ENCOUNTER — Emergency Department (HOSPITAL_BASED_OUTPATIENT_CLINIC_OR_DEPARTMENT_OTHER): Admitting: Radiology

## 2024-04-28 ENCOUNTER — Emergency Department (HOSPITAL_BASED_OUTPATIENT_CLINIC_OR_DEPARTMENT_OTHER)
Admission: EM | Admit: 2024-04-28 | Discharge: 2024-04-28 | Disposition: A | Discharge status: Home / Self Care | Source: Home / Self Care | Attending: Emergency Medicine | Admitting: Emergency Medicine

## 2024-04-28 ENCOUNTER — Encounter (HOSPITAL_BASED_OUTPATIENT_CLINIC_OR_DEPARTMENT_OTHER): Payer: Self-pay

## 2024-04-28 ENCOUNTER — Ambulatory Visit: Payer: Self-pay

## 2024-04-28 ENCOUNTER — Other Ambulatory Visit: Payer: Self-pay

## 2024-04-28 DIAGNOSIS — Z7982 Long term (current) use of aspirin: Secondary | ICD-10-CM | POA: Insufficient documentation

## 2024-04-28 DIAGNOSIS — E119 Type 2 diabetes mellitus without complications: Secondary | ICD-10-CM | POA: Diagnosis not present

## 2024-04-28 DIAGNOSIS — R079 Chest pain, unspecified: Secondary | ICD-10-CM | POA: Insufficient documentation

## 2024-04-28 DIAGNOSIS — Z79899 Other long term (current) drug therapy: Secondary | ICD-10-CM | POA: Diagnosis not present

## 2024-04-28 DIAGNOSIS — Z7984 Long term (current) use of oral hypoglycemic drugs: Secondary | ICD-10-CM | POA: Insufficient documentation

## 2024-04-28 DIAGNOSIS — I10 Essential (primary) hypertension: Secondary | ICD-10-CM | POA: Insufficient documentation

## 2024-04-28 DIAGNOSIS — R5383 Other fatigue: Secondary | ICD-10-CM | POA: Diagnosis not present

## 2024-04-28 LAB — CBC
HCT: 43 % (ref 39.0–52.0)
Hemoglobin: 14 g/dL (ref 13.0–17.0)
MCH: 28.2 pg (ref 26.0–34.0)
MCHC: 32.6 g/dL (ref 30.0–36.0)
MCV: 86.7 fL (ref 80.0–100.0)
Platelets: 358 K/uL (ref 150–400)
RBC: 4.96 MIL/uL (ref 4.22–5.81)
RDW: 14.9 % (ref 11.5–15.5)
WBC: 5.2 K/uL (ref 4.0–10.5)
nRBC: 0 % (ref 0.0–0.2)

## 2024-04-28 LAB — BASIC METABOLIC PANEL WITH GFR
Anion gap: 12 (ref 5–15)
BUN: 17 mg/dL (ref 6–20)
CO2: 25 mmol/L (ref 22–32)
Calcium: 10 mg/dL (ref 8.9–10.3)
Chloride: 100 mmol/L (ref 98–111)
Creatinine, Ser: 1.33 mg/dL — ABNORMAL HIGH (ref 0.61–1.24)
GFR, Estimated: >60 mL/min
Glucose, Bld: 114 mg/dL — ABNORMAL HIGH (ref 70–99)
Potassium: 4.2 mmol/L (ref 3.5–5.1)
Sodium: 137 mmol/L (ref 135–145)

## 2024-04-28 LAB — TROPONIN T, HIGH SENSITIVITY
Troponin T High Sensitivity: <15 ng/L (ref 0–19)
Troponin T High Sensitivity: <15 ng/L (ref 0–19)

## 2024-04-28 LAB — LIPASE, BLOOD: Lipase: 28 U/L (ref 11–51)

## 2024-04-28 MED ORDER — SUCRALFATE 1 G PO TABS: 1.0000 g | ORAL_TABLET | Freq: Three times a day (TID) | ORAL | 0 refills | Status: AC

## 2024-04-28 NOTE — Telephone Encounter (Signed)
 Pt is at ED currently

## 2024-04-28 NOTE — ED Triage Notes (Signed)
 Pt reports midline cp x3 days. Pt denies any SOB or N/V. Pt reports hx of GERD and hasn't been compliant with indigestion meds.

## 2024-04-28 NOTE — ED Provider Notes (Signed)
 " [place] EM[facility]ENC[facility] DEPARTMENT AT [facility] Provider Note   CSN: [id number] Arrival date & time: 04/28/24  9050     Patient presents with: Chest Pain   Christopher Skinner is a 53 y.o. male.    Chest Pain    Patient has a history of hyperlipidemia morbid obesity diabetes hypertension sleep apnea, acid reflux.  Patient presents to the ED with complaints of chest pain.  Patient states he has had some vague discomfort in his chest ongoing for the last few days.  Symptoms come and go mostly lasting minutes at a time.  Patient states initially started after he was eating something.  He has not noticed anything particular making it better or worse at this time.  Is not worse with eating.  Is not worse with deep breathing.  Is not worse with exertion.  He came to the ED today to make sure it was not anything serious  Prior to Admission medications  Medication Sig Start Date End Date Taking? Authorizing Provider  sucralfate (CARAFATE) 1 g tablet Take 1 tablet (1 g total) by mouth 4 (four) times daily -  with meals and at bedtime. 04/28/24  Yes Randol Simmonds, MD  albuterol  (VENTOLIN  HFA) 108 [phone] Base) MCG/ACT inhaler Inhale 2 puffs into the lungs every 4 (four) hours as needed for wheezing or shortness of breath. Patient not taking: Reported on 12/03/2023 12/24/22   Tysinger, Alm RAMAN, PA-C  aspirin  EC 81 MG tablet Take 1 tablet (81 mg total) by mouth daily. Swallow whole. 12/03/23   Tysinger, Alm RAMAN, PA-C  atorvastatin  (LIPITOR) 80 MG tablet Take 1 tablet (80 mg total) by mouth daily. 12/03/23   Tysinger, Alm RAMAN, PA-C  blood glucose meter kit and supplies KIT Dispense based on patient and insurance preference. Use 1-2 times daily as directed. (FOR ICD-10 DE11.69). 06/08/19   Tysinger, Alm RAMAN, PA-C  Blood Glucose Monitoring Suppl (TRUE METRIX METER) DEVI 1 Device by Does not apply route daily. 06/10/19   Tysinger, Alm RAMAN, PA-C  carvedilol  (COREG ) 12.5 MG tablet Take 1 tablet (12.5 mg total) by  mouth 2 (two) times daily with a meal. 12/03/23   Tysinger, Alm RAMAN, PA-C  cyanocobalamin  (VITAMIN B12) 1000 MCG tablet Take 1 tablet (1,000 mcg total) by mouth daily. 12/23/22   Tysinger, Alm RAMAN, PA-C  empagliflozin  (JARDIANCE ) 25 MG TABS tablet Take 1 tablet (25 mg total) by mouth daily before breakfast. 12/03/23   Tysinger, Alm RAMAN, PA-C  fluticasone  (FLONASE ) 50 MCG/ACT nasal spray Place 2 sprays into both nostrils daily. 01/10/24   Leath-Warren, Etta PARAS, NP  glucose blood test strip True metrix strips to test 1-2 times daily 05/21/21   Tysinger, Alm RAMAN, PA-C  hydrOXYzine  (ATARAX ) 10 MG tablet Take 1 tablet (10 mg total) by mouth 3 (three) times daily as needed. 11/25/23   Tysinger, Alm RAMAN, PA-C  Lancets MISC Use as instructed. Needs lancets based on pt's meter (true metrix) 05/21/21   Tysinger, Alm RAMAN, PA-C  losartan -hydrochlorothiazide  (HYZAAR) 100-12.5 MG tablet Take 1 tablet by mouth daily. 12/03/23   Tysinger, Alm RAMAN, PA-C  omeprazole  (PRILOSEC) 40 MG capsule Take 1 capsule (40 mg total) by mouth daily. 12/03/23   Tysinger, Alm RAMAN, PA-C  promethazine -dextromethorphan (PROMETHAZINE -DM) 6.25-15 MG/5ML syrup Take 5 mLs by mouth 4 (four) times daily as needed. 01/10/24   Leath-Warren, Etta PARAS, NP  Testosterone  (ANDROGEL  PUMP) 20.25 MG/ACT (1.62%) GEL Place 2 Pump onto the skin daily. 12/17/23   Tysinger, Alm RAMAN,  PA-C  tirzepatide  (MOUNJARO ) 15 MG/0.5ML Pen Inject 15 mg into the skin once a week. 12/03/23   Tysinger, Alm RAMAN, PA-C    Allergies: Trulicity  [dulaglutide ], Metformin  and related, and Shellfish allergy    Review of Systems  Cardiovascular:  Positive for chest pain.    Updated Vital Signs BP 122/66   Pulse 99   Temp 97.8 F (36.6 C) (Oral)   Resp 19   Ht 1.753 m (5' 9)   Wt (!) 202.8 kg   SpO2 100%   BMI 66.01 kg/m   Physical Exam Vitals and nursing note reviewed.  Constitutional:      General: He is not in acute distress.    Appearance: He is well-developed.  HENT:      Head: Normocephalic and atraumatic.     Right Ear: External ear normal.     Left Ear: External ear normal.  Eyes:     General: No scleral icterus.       Right eye: No discharge.        Left eye: No discharge.     Conjunctiva/sclera: Conjunctivae normal.  Neck:     Trachea: No tracheal deviation.  Cardiovascular:     Rate and Rhythm: Normal rate and regular rhythm.  Pulmonary:     Effort: Pulmonary effort is normal. No respiratory distress.     Breath sounds: Normal breath sounds. No stridor. No wheezing or rales.  Abdominal:     General: Bowel sounds are normal. There is no distension.     Palpations: Abdomen is soft.     Tenderness: There is no abdominal tenderness. There is no guarding or rebound.  Musculoskeletal:        General: No tenderness or deformity.     Cervical back: Neck supple.  Skin:    General: Skin is warm and dry.     Findings: No rash.  Neurological:     General: No focal deficit present.     Mental Status: He is alert.     Cranial Nerves: No cranial nerve deficit, dysarthria or facial asymmetry.     Sensory: No sensory deficit.     Motor: No abnormal muscle tone or seizure activity.     Coordination: Coordination normal.  Psychiatric:        Mood and Affect: Mood normal.     (all labs ordered are listed, but only abnormal results are displayed) Labs Reviewed  BASIC METABOLIC PANEL WITH GFR - Abnormal; Notable for the following components:      Result Value   Glucose, Bld 114 (*)    Creatinine, Ser 1.33 (*)    All other components within normal limits  CBC  LIPASE, BLOOD  TROPONIN T, HIGH SENSITIVITY  TROPONIN T, HIGH SENSITIVITY    EKG: EKG Interpretation Date/Time:  Wednesday April 28 2024 09:55:40 EST Ventricular Rate:  95 PR Interval:  223 QRS Duration:  90 QT Interval:  349 QTC Calculation: 439 R Axis:   60  Text Interpretation: Sinus rhythm Prolonged PR interval Low voltage, precordial leads No significant change since last  tracing Confirmed by Randol Simmonds [phone]) on 04/28/2024 10:04:14 AM  Radiology: [facility] Chest 2 View Result Date: 04/28/2024 CLINICAL DATA:  Chest pain EXAM: CHEST - 2 VIEW COMPARISON:  10/08/2021 FINDINGS: The cardiomediastinal contours are normal. The lungs are clear. Pulmonary vasculature is normal. No consolidation, pleural effusion, or pneumothorax. Anterior osteophytes throughout the thoracic spine. No acute osseous abnormalities are seen. IMPRESSION: No active cardiopulmonary disease. Electronically Signed  By: Andrea Gasman M.D.   On: 04/28/2024 11:03     Procedures   Medications Ordered in the ED - No data to display  Clinical Course as of 04/28/24 1302  Wed Apr 28, 2024  1245 CBC nl [JK]  1245 Basic metabolic panel(!) nl [JK]  1245 Lipase, blood nl [JK]  1245 DG Chest 2 View nl [JK]    Clinical Course User Index [JK] Randol Simmonds, MD             HEART Score: 3                    Medical Decision Making Problems Addressed: Chest pain, unspecified type: acute illness or injury  Amount and/or Complexity of Data Reviewed Labs: ordered. Decision-making details documented in ED Course. Radiology: ordered and independent interpretation performed. Decision-making details documented in ED Course.  Risk Prescription drug management.   Patient presented to the ED for evaluation of chest pain.  ED workup reassuring.  No findings to suggest acute infection.  No pneumonia on x-ray.  Symptoms atypical for ACS and his serial troponins were normal.  Patient does report symptoms starting after eating.  Will try adding Carafate to his antacids.  Stable for outpatient follow-up to be rechecked.     Final diagnoses:  Chest pain, unspecified type    ED Discharge Orders          Ordered    sucralfate (CARAFATE) 1 g tablet  3 times daily with meals & bedtime        04/28/24 1301               Randol Simmonds, MD 04/28/24 1303  "

## 2024-04-28 NOTE — Telephone Encounter (Signed)
 FYI Only or Action Required?: FYI only for provider: ED advised.  Patient was last seen in primary care on 12/03/2023 by Bulah Alm RAMAN, PA-C.  Called Nurse Triage reporting Chest Pain.  Symptoms began x 3 days.  Interventions attempted: Nothing.  Symptoms are: gradually worsening.  Triage Disposition: Go to ED Now (Notify PCP)  Patient/caregiver understands and will follow disposition?: Yes    Copied from CRM #[id number]. Topic: Clinical - Red Word Triage >> Apr 28, 2024  8:48 AM Charlet HERO wrote: Red Word that prompted transfer to Nurse Triage: Chest pain  like severe indegestion, been going on for a couple of days.  Tysinger Reason for Disposition  [1] Chest pain (or angina) comes and goes AND [2] is happening more often (increasing in frequency) or getting worse (increasing in severity)  (Exception: Chest pains that last only a few seconds.)  Answer Assessment - Initial Assessment Questions 1. LOCATION: Where does it hurt?       Left chest and sometimes mid chest 2. RADIATION: Does the pain go anywhere else? (e.g., into neck, jaw, arms, back)     no 3. ONSET: When did the chest pain begin? (Minutes, hours or days)      X 3 days 4. PATTERN: Does the pain come and go, or has it been constant since it started?  Does it get worse with exertion?      Comes and goes 5. DURATION: How long does it last (e.g., seconds, minutes, hours)     unsure 6. SEVERITY: How bad is the pain?  (e.g., Scale 1-10; mild, moderate, or severe)     6 or 7/10 7. CARDIAC RISK FACTORS: Do you have any history of heart problems or risk factors for heart disease? (e.g., angina, prior heart attack; diabetes, high blood pressure, high cholesterol, smoker, or strong family history of heart disease)     na 8. PULMONARY RISK FACTORS: Do you have any history of lung disease?  (e.g., blood clots in lung, asthma, emphysema, birth control pills)     na 9. CAUSE: What do you think is causing the  chest pain?     unsure 10. OTHER SYMPTOMS: Do you have any other symptoms? (e.g., dizziness, nausea, vomiting, sweating, fever, difficulty breathing, cough)       Chest pain and indigestion type feeling, bloating 11. PREGNANCY: Is there any chance you are pregnant? When was your last menstrual period?       Na  Spoke with wife and patient: nurse referred to ED: verbalized understanding  Protocols used: Chest Pain-A-AH

## 2024-04-28 NOTE — ED Notes (Signed)
 ED Provider at bedside.

## 2024-04-28 NOTE — Discharge Instructions (Signed)
 The test today in the ED were reassuring.  Try the addition of the Carafate to see if that helps with your symptoms.  Follow-up with your primary care doctor to be rechecked.

## 2024-05-11 ENCOUNTER — Ambulatory Visit (INDEPENDENT_AMBULATORY_CARE_PROVIDER_SITE_OTHER): Admitting: Audiology

## 2024-05-11 DIAGNOSIS — H903 Sensorineural hearing loss, bilateral: Secondary | ICD-10-CM | POA: Diagnosis not present

## 2024-05-11 NOTE — Progress Notes (Signed)
" °  [address] [place], [place] [address] [phone]  Audiological Evaluation    Name: Christopher Skinner     DOB:   06-10-1970      MRN:   [id number]                                                                                     Service Date: 05/11/2024     Accompanied by: wife   Patient comes today after Dr. Anice, ENT sent a referral for a hearing evaluation due to concerns with tinnitus.   Symptoms Yes Details  Hearing loss  []  Not perceived overall  Tinnitus  [x]  Onset last year, both ears - high pitched- reports sometimes gets bad at night  Wife reports he turns the TV on to manage his tinnitus.  Ear pain/ infections/pressure  []    Balance problems  []    Noise exposure history  [x]  Music; clubs  Previous ear surgeries  []    Family history of hearing loss  []    Amplification  []    Other  []      Otoscopy: Right ear: Clear external ear canal and notable landmarks visualized on the tympanic membrane. Left ear:  Clear external ear canal and notable landmarks visualized on the tympanic membrane.  Tympanometry: Right ear: Type A - Normal external ear canal volume with normal middle ear pressure and normal tympanic membrane compliance. Findings are consistent with normal middle ear function. Left ear: Type A - Normal external ear canal volume with normal middle ear pressure and normal tympanic membrane compliance. Findings are consistent with normal middle ear function.  Hearing Evaluation The hearing test results were completed under headphones and results are deemed to be of good reliability. Test technique:  conventional    Pure tone Audiometry: Right ear- Normal to borderline normal hearing from [id number], then mild presumably sensorineural hearing loss from 6000 Hz - 8000 Hz. Left ear-  Normal to borderline normal hearing from [phone], then mild presumably sensorineural hearing loss at 8000 Hz.  Speech Audiometry: Right ear- Speech Reception  Threshold (SRT) was obtained at 10 dBHL. Left ear-Speech Reception Threshold (SRT) was obtained at 10 dBHL.   Word Recognition Score Tested using NU-6 (recorded) Right ear: 92% was obtained at a presentation level of 55 dBHL with contralateral masking which is deemed as  excellent. Left ear: 96% was obtained at a presentation level of 55 dBHL with contralateral masking which is deemed as  excellent.   Impression: There is not a significant difference in pure-tone thresholds between ears. There is not a significant difference in the word recognition score in between ears.    Recommendations: Follow up with ENT as scheduled. Return for a hearing evaluation if concerns with hearing changes arise or per MD recommendation. Hearing protection use when exposed to loud/damaging sounds.  Consider various tinnitus strategies, including the use of a sound generator, hearing aids, and/or tinnitus retraining therapy. [facility] for further evaluation and counseling.   [name] [name], AUD  "

## 2024-05-21 ENCOUNTER — Encounter: Payer: Self-pay | Admitting: Audiology

## 2024-06-09 ENCOUNTER — Institutional Professional Consult (permissible substitution) (INDEPENDENT_AMBULATORY_CARE_PROVIDER_SITE_OTHER)

## 2024-07-13 ENCOUNTER — Institutional Professional Consult (permissible substitution) (INDEPENDENT_AMBULATORY_CARE_PROVIDER_SITE_OTHER)

## 2024-08-09 ENCOUNTER — Encounter: Payer: Self-pay | Admitting: Medical
